# Patient Record
Sex: Female | Born: 1945
Health system: Southern US, Community
[De-identification: ages and names within clinical notes are randomized; demographics above are authoritative.]

## PROBLEM LIST (undated history)

## (undated) DIAGNOSIS — T7840XA Allergy, unspecified, initial encounter: Secondary | ICD-10-CM

## (undated) DIAGNOSIS — C801 Malignant (primary) neoplasm, unspecified: Secondary | ICD-10-CM

## (undated) DIAGNOSIS — R05 Cough: Secondary | ICD-10-CM

## (undated) DIAGNOSIS — K219 Gastro-esophageal reflux disease without esophagitis: Secondary | ICD-10-CM

## (undated) DIAGNOSIS — E785 Hyperlipidemia, unspecified: Secondary | ICD-10-CM

## (undated) DIAGNOSIS — D649 Anemia, unspecified: Secondary | ICD-10-CM

## (undated) DIAGNOSIS — F419 Anxiety disorder, unspecified: Secondary | ICD-10-CM

## (undated) DIAGNOSIS — F32A Depression, unspecified: Secondary | ICD-10-CM

## (undated) DIAGNOSIS — R059 Cough, unspecified: Secondary | ICD-10-CM

## (undated) DIAGNOSIS — F329 Major depressive disorder, single episode, unspecified: Secondary | ICD-10-CM

## (undated) DIAGNOSIS — M1990 Unspecified osteoarthritis, unspecified site: Secondary | ICD-10-CM

## (undated) HISTORY — DX: Depression, unspecified: F32.A

## (undated) HISTORY — DX: Hyperlipidemia, unspecified: E78.5

## (undated) HISTORY — DX: Anxiety disorder, unspecified: F41.9

## (undated) HISTORY — DX: Allergy, unspecified, initial encounter: T78.40XA

## (undated) HISTORY — PX: COLONOSCOPY: SHX174

## (undated) HISTORY — DX: Gastro-esophageal reflux disease without esophagitis: K21.9

## (undated) HISTORY — DX: Major depressive disorder, single episode, unspecified: F32.9

---

## 1978-11-12 HISTORY — PX: BACK SURGERY: SHX140

## 2002-01-09 ENCOUNTER — Other Ambulatory Visit: Admission: RE | Admit: 2002-01-09 | Discharge: 2002-01-09 | Payer: Self-pay | Admitting: Family Medicine

## 2010-11-01 ENCOUNTER — Ambulatory Visit: Payer: Self-pay | Admitting: Family Medicine

## 2010-12-04 ENCOUNTER — Encounter: Payer: Self-pay | Admitting: Family Medicine

## 2011-10-02 LAB — HM MAMMOGRAPHY: HM Mammogram: NORMAL

## 2011-10-02 LAB — HM PAP SMEAR: HM Pap smear: NORMAL

## 2011-11-27 ENCOUNTER — Ambulatory Visit: Payer: Self-pay | Admitting: Family Medicine

## 2012-07-15 ENCOUNTER — Telehealth: Payer: Self-pay

## 2012-07-15 NOTE — Telephone Encounter (Signed)
Dr Smith, please advise.

## 2012-07-15 NOTE — Telephone Encounter (Signed)
Message to Dr. Katrinka Blazing- She wants to speak with Dr. Katrinka Blazing as soon as possible. She has received a letter from [employer] that they have terminated her relationship with [facility]. A previous letter from the practice stated that if your patients did not choose to follow you, and she couldn't because it was too far away, that [place] would be happy to continue her care with another provider.  She thinks the letters are very contradictory and she wants you to get her in with Dr. Sullivan Lone or Dr.Monroe.

## 2012-08-10 ENCOUNTER — Ambulatory Visit (INDEPENDENT_AMBULATORY_CARE_PROVIDER_SITE_OTHER): Payer: Medicare Other | Admitting: Family Medicine

## 2012-08-10 ENCOUNTER — Ambulatory Visit: Payer: Medicare Other

## 2012-08-10 VITALS — BP 128/60 | HR 62 | Temp 97.6°F | Resp 16 | Ht 63.0 in | Wt 128.0 lb

## 2012-08-10 DIAGNOSIS — R059 Cough, unspecified: Secondary | ICD-10-CM

## 2012-08-10 DIAGNOSIS — K219 Gastro-esophageal reflux disease without esophagitis: Secondary | ICD-10-CM

## 2012-08-10 DIAGNOSIS — Z23 Encounter for immunization: Secondary | ICD-10-CM

## 2012-08-10 DIAGNOSIS — J989 Respiratory disorder, unspecified: Secondary | ICD-10-CM

## 2012-08-10 DIAGNOSIS — J309 Allergic rhinitis, unspecified: Secondary | ICD-10-CM

## 2012-08-10 DIAGNOSIS — E7800 Pure hypercholesterolemia, unspecified: Secondary | ICD-10-CM

## 2012-08-10 DIAGNOSIS — R05 Cough: Secondary | ICD-10-CM

## 2012-08-10 DIAGNOSIS — F418 Other specified anxiety disorders: Secondary | ICD-10-CM

## 2012-08-10 DIAGNOSIS — F341 Dysthymic disorder: Secondary | ICD-10-CM

## 2012-08-10 LAB — COMPREHENSIVE METABOLIC PANEL
ALT: 11 U/L (ref 0–35)
AST: 12 U/L (ref 0–37)
Albumin: 4.2 g/dL (ref 3.5–5.2)
Alkaline Phosphatase: 84 U/L (ref 39–117)
BUN: 13 mg/dL (ref 6–23)
CO2: 30 mEq/L (ref 19–32)
Calcium: 9 mg/dL (ref 8.4–10.5)
Chloride: 104 mEq/L (ref 96–112)
Creat: 0.76 mg/dL (ref 0.50–1.10)
Glucose, Bld: 80 mg/dL (ref 70–99)
Potassium: 4.4 mEq/L (ref 3.5–5.3)
Sodium: 141 mEq/L (ref 135–145)
Total Bilirubin: 0.5 mg/dL (ref 0.3–1.2)
Total Protein: 6.6 g/dL (ref 6.0–8.3)

## 2012-08-10 LAB — POCT CBC
Granulocyte percent: 60.9 %G (ref 37–80)
HCT, POC: 42 % (ref 37.7–47.9)
Hemoglobin: 13 g/dL (ref 12.2–16.2)
Lymph, poc: 1.7 (ref 0.6–3.4)
MCH, POC: 27.5 pg (ref 27–31.2)
MCHC: 31 g/dL — AB (ref 31.8–35.4)
MCV: 88.9 fL (ref 80–97)
MID (cbc): 0.4 (ref 0–0.9)
MPV: 9.9 fL (ref 0–99.8)
POC Granulocyte: 3.3 (ref 2–6.9)
POC LYMPH PERCENT: 31.5 %L (ref 10–50)
POC MID %: 7.6 %M (ref 0–12)
Platelet Count, POC: 226 10*3/uL (ref 142–424)
RBC: 4.73 M/uL (ref 4.04–5.48)
RDW, POC: 15.1 %
WBC: 5.5 10*3/uL (ref 4.6–10.2)

## 2012-08-10 LAB — LIPID PANEL
Cholesterol: 208 mg/dL — ABNORMAL HIGH (ref 0–200)
HDL: 65 mg/dL (ref >39)
LDL Cholesterol: 126 mg/dL — ABNORMAL HIGH (ref 0–99)
Total CHOL/HDL Ratio: 3.2 Ratio
Triglycerides: 86 mg/dL (ref <150)
VLDL: 17 mg/dL (ref 0–40)

## 2012-08-10 MED ORDER — FLUOXETINE HCL 20 MG PO CAPS: 20.0000 mg | ORAL_CAPSULE | Freq: Every day | ORAL | Status: DC

## 2012-08-10 MED ORDER — OMEPRAZOLE 40 MG PO CPDR: 40.0000 mg | DELAYED_RELEASE_CAPSULE | Freq: Every day | ORAL | Status: DC

## 2012-08-10 NOTE — Progress Notes (Signed)
[address]   [place], [place]  [address]   [phone]  Subjective:    Patient ID: Nancy Hamilton, female    DOB: Apr 27, 1946, 66 y.o.   MRN: [id number]  HPIThis 66 y.o. female presents to establish care and for follow-up:  1.  Anxiety/depression:  Eleven month follow-up for anxiety.  Tried to re-establish with Dr. Sullivan Lone and office manager refused to continue to provide care.  Dr. Sullivan Lone will only give rx of Prozac for one month.  Requesting medication for three months; [facility].  Husband now only on Ensure three times per day; can only drink through draw.  In January 2013, unable to swallow at all.  Then neck muscles started moving again and able to swallow again.  From then on, able to eat anything.  Now only on Dilantin and Xanax.  Dr. Sandria Manly not sure of reversal.  Without medication, food tasted good for a while.  Unable to talk at this time; mind is still there.  Financially stressful; Hospice and sitter present for one year.  Lots of financial stress.  Pay a little bit per month.  Still working at farm to help pay for sitter.  Sleeping a lot.  Fatigued.  Tired at night.  Sleeping late until 10:30.  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  Compliance with Prozac.    2.  GERD:  Coughing a lot more in past six months; taking Prilosec 40mg  daily; did have one episode of burning in throat.  Denies n/v/d; denies melena or bloody stools; no abdominal pain.  No chronic PND or nasal congestion for past six months.    3.  Allergic rhinitis:  Suffering with sinus congestion for past week.  Now finally improving; compliance with daily Zyrtec.  No fever/chills/sweats. No sinus pressure now.  4.  Hypercholesterolemia:  Ten month follow-up for cholesterol; management at last visit included starting Pravastatin; patient non-compliant with recommendations.  Not eating right; not exercising; eating a lot of junk.  Never started Pravastatin out of fear of side effects.  All siblings with  hypercholesterolemia warranting medication.  5.  Flu vaccine: requesting.    6.  Cough: has worsened over past 6 months; dry cough.  No nighttime cough.  +morning cough.  No sputum.  No SOB or DOE.  No leg swelling.  No chest pain.  No wheezing. No head congestion other than past week.     Review of Systems  Constitutional: Negative for fever, chills, diaphoresis and fatigue.  HENT: Positive for congestion, rhinorrhea, sneezing, voice change and postnasal drip. Negative for ear pain, sore throat, mouth sores, trouble swallowing and ear discharge.   Respiratory: Positive for cough. Negative for shortness of breath and wheezing.   Cardiovascular: Negative for chest pain, palpitations and leg swelling.  Gastrointestinal: Negative for nausea, vomiting, abdominal pain, diarrhea, constipation and blood in stool.  Neurological: Negative for dizziness, weakness, light-headedness, numbness and headaches.  Psychiatric/Behavioral: Positive for dysphoric mood. Negative for suicidal ideas, disturbed wake/sleep cycle and self-injury. The patient is nervous/anxious.         Past Medical History  Diagnosis Date  . Allergy   . Anxiety   . Depression   . GERD (gastroesophageal reflux disease)   . Hyperlipidemia     No past surgical history on file.  Prior to Admission medications   Medication Sig Start Date End Date Taking? Authorizing Provider  cetirizine (ZYRTEC) 10 MG tablet Take 10 mg by mouth daily.   Yes Historical  Provider, MD  FLUoxetine (PROZAC) 20 MG capsule Take 1 capsule (20 mg total) by mouth daily. 08/10/12  Yes Ethelda Chick, MD  omeprazole (PRILOSEC) 40 MG capsule Take 1 capsule (40 mg total) by mouth daily. 08/10/12  Yes Ethelda Chick, MD    Allergies  Allergen Reactions  . Tylox (Oxycodone-Acetaminophen) Rash    History   Social History  . Marital Status: Married    Spouse Name: N/A    Number of Children: N/A  . Years of Education: N/A   Occupational History  . Not  on file.   Social History Main Topics  . Smoking status: Former Smoker    Quit date: 08/10/1992  . Smokeless tobacco: Not on file  . Alcohol Use: No  . Drug Use: No  . Sexually Active: Not Currently   Other Topics Concern  . Not on file   Social History Narrative   Marital status: married; husband with progressively worsening neurological disorder; Hospice involved in 2013.  Living with: husband.  Employment: working 40 hours per week.   Tobacco: former user.   Alcohol: none   Drugs: none   Exercise: none; physically demanding job.    Family History  Problem Relation Age of Onset  . Hyperlipidemia Sister   . Fibromyalgia Sister     Objective:   Physical Exam  Nursing note and vitals reviewed. Constitutional: She is oriented to person, place, and time. She appears well-developed and well-nourished. No distress.  HENT:  Head: Normocephalic and atraumatic.  Right Ear: External ear normal.  Left Ear: External ear normal.  Nose: Nose normal.  Mouth/Throat: Oropharynx is clear and moist.  Eyes: Conjunctivae normal and EOM are normal. Pupils are equal, round, and reactive to light.  Neck: Normal range of motion. Neck supple. No thyromegaly present.  Cardiovascular: Normal rate, regular rhythm, normal heart sounds and intact distal pulses.  Exam reveals no gallop and no friction rub.   No murmur heard. Pulmonary/Chest: Effort normal and breath sounds normal. No respiratory distress. She has no wheezes. She has no rales.  Abdominal: Soft. Bowel sounds are normal. She exhibits no distension and no mass. There is no tenderness. There is no rebound and no guarding.  Lymphadenopathy:    She has no cervical adenopathy.  Neurological: She is alert and oriented to person, place, and time.  Skin: Skin is warm and dry. She is not diaphoretic.  Psychiatric: She has a normal mood and affect. Her behavior is normal. Judgment and thought content normal.       Anxious.    INFLUENZA VACCINE  ADMINISTERED.      Results for orders placed in visit on 08/10/12  POCT CBC      Component Value Range   WBC 5.5  4.6 - 10.2 K/uL   Lymph, poc 1.7  0.6 - 3.4   POC LYMPH PERCENT 31.5  10 - 50 %L   MID (cbc) 0.4  0 - 0.9   POC MID % 7.6  0 - 12 %M   POC Granulocyte 3.3  2 - 6.9   Granulocyte percent 60.9  37 - 80 %G   RBC 4.73  4.04 - 5.48 M/uL   Hemoglobin 13.0  12.2 - 16.2 g/dL   HCT, POC 96.0  45.4 - 47.9 %   MCV 88.9  80 - 97 fL   MCH, POC 27.5  27 - 31.2 pg   MCHC 31.0 (*) 31.8 - 35.4 g/dL   RDW, POC 15.1  Platelet Count, POC 226  142 - 424 K/uL   MPV 9.9  0 - 99.8 fL    [facility] reading (PRIMARY) by  Dr. Katrinka Blazing.  CXR; NAD   Assessment & Plan:   1. Depression with anxiety    2. Allergic rhinitis, cause unspecified    3. Pure hypercholesterolemia  POCT CBC, Comprehensive metabolic panel, Lipid panel  4. GERD (gastroesophageal reflux disease)    5. Need for prophylactic vaccination and inoculation against influenza  Flu vaccine greater than or equal to 3yo preservative free IM  6. Cough  DG Chest 2 View      1.  Depression with anxiety:  Stable despite declining health of husband; good social/family support.  Hospice involved. Counseling provided during visit. Rx for Prozac provided. 2.  Allergic Rhinitis: stable; continue current medication. 3.  Hypercholesterolemia: Mild but strong family history of early CAD; obtain labs; patient non-compliant with Pravastatin therapy; to start. 4.  GERD: possibly worsening due to recurrent cough; continue Prilosec but increase to bid for next month and then decrease to one daily.  Dietary modification reviewed.  RF provided. 5.  Cough:  New/recurrent; CXR negative; historically chronic cough due to GERD; has undergone extensive evaluation in past.  Increase PPI to bid for one month.  If persists, RTC.  6.  S/p flu vaccine.

## 2012-08-10 NOTE — Patient Instructions (Addendum)
1. Depression with anxiety    2. Allergic rhinitis, cause unspecified    3. Pure hypercholesterolemia  POCT CBC, Comprehensive metabolic panel, Lipid panel  4. GERD (gastroesophageal reflux disease)    5. Need for prophylactic vaccination and inoculation against influenza  Flu vaccine greater than or equal to 65yo preservative free IM  6. Cough  DG Chest 2 View

## 2012-08-12 NOTE — Telephone Encounter (Signed)
Patient presented to [facility] for follow-up and rx refills 08/10/12.  No further action warranted at this time.

## 2012-08-13 ENCOUNTER — Encounter: Payer: Self-pay | Admitting: Family Medicine

## 2012-08-13 NOTE — Progress Notes (Signed)
Reviewed and agree.

## 2012-08-14 ENCOUNTER — Telehealth: Payer: Self-pay

## 2012-08-14 NOTE — Telephone Encounter (Signed)
Pt left a message on the billing office voicemail, that she is returning our call for lab results Please call pt on her work cell number [phone]

## 2012-08-14 NOTE — Telephone Encounter (Signed)
Pt has a lab and xray overread

## 2012-08-16 NOTE — Telephone Encounter (Signed)
See labs 

## 2012-08-26 ENCOUNTER — Encounter: Payer: Self-pay | Admitting: Radiology

## 2012-09-02 ENCOUNTER — Telehealth: Payer: Self-pay

## 2012-09-02 ENCOUNTER — Telehealth: Payer: Self-pay | Admitting: Radiology

## 2012-09-02 NOTE — Telephone Encounter (Signed)
Spoke with pt and gave her advice from Dr Katrinka Blazing in regards to taking cholesterol medication. Pt did state understanding about the med and x-ray report.   FYI: She states she has been faithful in taking the Prilosec(even doubled it one day) to see if any difference, nothing.  Also she has been taking her Zantac regularly.  But she is still dealing with the cough, would like to know what else could this be causing her cough?

## 2012-09-04 ENCOUNTER — Telehealth: Payer: Self-pay | Admitting: Family Medicine

## 2012-09-04 NOTE — Telephone Encounter (Signed)
Spoke with patient gave her details and patient states that she think that its her allergies

## 2012-09-04 NOTE — Telephone Encounter (Signed)
Call --- chronic cough is usually one of a few things:  1 . Allergies (is she having nasal congestion, runny nose, sneezing, or drainage in back of throat?).  2.  GERD/reflux   3.  Asthma (is she short of breath with exertion?  Is she having any chest tightness with exertion or exposure to cold air?).  KMS

## 2012-10-01 LAB — HM COLONOSCOPY: HM Colonoscopy: 2

## 2012-10-17 NOTE — Telephone Encounter (Signed)
none

## 2012-12-01 ENCOUNTER — Encounter: Payer: Self-pay | Admitting: Internal Medicine

## 2012-12-01 ENCOUNTER — Ambulatory Visit (INDEPENDENT_AMBULATORY_CARE_PROVIDER_SITE_OTHER): Payer: Medicare Other | Admitting: Internal Medicine

## 2012-12-01 ENCOUNTER — Ambulatory Visit: Payer: Self-pay | Admitting: Family Medicine

## 2012-12-01 VITALS — BP 120/68 | HR 67 | Temp 97.5°F | Resp 16 | Ht 62.0 in | Wt 133.5 lb

## 2012-12-01 DIAGNOSIS — E559 Vitamin D deficiency, unspecified: Secondary | ICD-10-CM

## 2012-12-01 DIAGNOSIS — R053 Chronic cough: Secondary | ICD-10-CM | POA: Insufficient documentation

## 2012-12-01 DIAGNOSIS — R05 Cough: Secondary | ICD-10-CM | POA: Insufficient documentation

## 2012-12-01 DIAGNOSIS — J4530 Mild persistent asthma, uncomplicated: Secondary | ICD-10-CM | POA: Insufficient documentation

## 2012-12-01 DIAGNOSIS — F411 Generalized anxiety disorder: Secondary | ICD-10-CM

## 2012-12-01 DIAGNOSIS — R059 Cough, unspecified: Secondary | ICD-10-CM

## 2012-12-01 DIAGNOSIS — E785 Hyperlipidemia, unspecified: Secondary | ICD-10-CM

## 2012-12-01 DIAGNOSIS — K219 Gastro-esophageal reflux disease without esophagitis: Secondary | ICD-10-CM

## 2012-12-01 NOTE — Progress Notes (Signed)
Patient ID: Nancy Hamilton, female   DOB: [birth year], 67 y.o.   MRN: [id number]   Patient Active Problem List  Diagnosis  . Generalized anxiety disorder  . GERD (gastroesophageal reflux disease)  . Cough    Subjective:  CC:   Chief Complaint  Patient presents with  . Establish Care    HPI:   Nancy Hamilton is a 67 y.o. female who presents as a new patient to establish primary care .  CC is too many medications,  She was treated for URI/chronic cough and was put on 4 meds.  Including patanase, xuzal,  singulair and pantoprazole.   History of GAD second ary to husbands' decline from [facility] Syndrome ,  Now receiving help from Hospice.  Hyperlipidemia now on statins since November (when cxr showed coronary calc's) without adverse effects but having hot flahses since she started. Has not had choelsterol recheced.  So she is fasting today .   History of reflux previously managed with omeprazole, now on protonix.   Allergy testing was negative but she sneezes, so she thinks she needs to be on allergy medications.  CXR was done for cough and coronary calicifcations was noted.   Sees [facility] annually Feb 17th   Last DEXA was recently not sure when due at [facility]   Mammograms now done at [place]. Wants to switch to [facility]      Past Medical History  Diagnosis Date  . Allergy   . Anxiety   . Depression   . GERD (gastroesophageal reflux disease)   . Hyperlipidemia     Past Surgical History  Procedure Date  . Back surgery 1980    Ruptured Disc    Family History  Problem Relation Age of Onset  . Hyperlipidemia Sister   . Fibromyalgia Sister     History   Social History  . Marital Status: Married    Spouse Name: N/A    Number of Children: N/A  . Years of Education: N/A   Occupational History  . Not on file.   Social History Main Topics  . Smoking status: Former Smoker    Quit date: 08/10/1992  . Smokeless tobacco: Not on file  . Alcohol Use: No  .  Drug Use: No  . Sexually Active: Not Currently   Other Topics Concern  . Not on file   Social History Narrative   Marital status: married; husband with progressively worsening neurological disorder; Hospice involved in 2013.  Living with: husband.  Employment: working 40 hours per week.   Tobacco: former user.   Alcohol: none   Drugs: none   Exercise: none; physically demanding job.   Allergies  Allergen Reactions  . Tylox (Oxycodone-Acetaminophen) Rash    Review of Systems:   Patient denies headache, fevers, malaise, unintentional weight loss, skin rash, eye pain, sinus congestion and sinus pain, sore throat, dysphagia,  hemoptysis , cough, dyspnea, wheezing, chest pain, palpitations, orthopnea, edema, abdominal pain, nausea, melena, diarrhea, constipation, flank pain, dysuria, hematuria, urinary  Frequency, nocturia, numbness, tingling, seizures,  Focal weakness, Loss of consciousness,  Tremor, insomnia, depression, anxiety, and suicidal ideation.       Objective:  BP 120/68  Pulse 67  Temp 97.5 F (36.4 C) (Oral)  Resp 16  Ht 5\' 2"  (1.575 m)  Wt 133 lb 8 oz (60.555 kg)  BMI 24.42 kg/m2  SpO2 98%  General appearance: alert, cooperative and appears stated age Ears: normal TM's and external ear canals both ears Throat: lips, mucosa, and  tongue normal; teeth and gums normal Neck: no adenopathy, no carotid bruit, supple, symmetrical, trachea midline and thyroid not enlarged, symmetric, no tenderness/mass/nodules Back: symmetric, no curvature. ROM normal. No CVA tenderness. Lungs: clear to auscultation bilaterally Heart: regular rate and rhythm, S1, S2 normal, no murmur, click, rub or gallop Abdomen: soft, non-tender; bowel sounds normal; no masses,  no organomegaly Pulses: 2+ and symmetric Skin: Skin color, texture, turgor normal. No rashes or lesions Lymph nodes: Cervical, supraclavicular, and axillary nodes normal.  Assessment and Plan:  Cough Chronic, etiology  unclear, now resolved on 4 drug therapy. .  Will reduce meds one by one .,. Stop patanase first.   GERD (gastroesophageal reflux disease) Managed with protonix.   Generalized anxiety disorder Well controlled on current regimen.  no changes today.  Other and unspecified hyperlipidemia Well controlled on current regimen. LFTS are normal, no changes today.   Updated Medication List Outpatient Encounter Prescriptions as of 12/01/2012  Medication Sig Dispense Refill  . aspirin EC 81 MG tablet Take 81 mg by mouth daily.      . Coenzyme Q10 (CO Q 10 PO) Take 200 mg by mouth daily.      Marland Kitchen FLUoxetine (PROZAC) 20 MG capsule Take 1 capsule (20 mg total) by mouth daily.  90 capsule  1  . levocetirizine (XYZAL) 5 MG tablet Take 5 mg by mouth every evening.       . montelukast (SINGULAIR) 10 MG tablet Take 10 mg by mouth at bedtime.       . Olopatadine HCl (PATANASE) 0.6 % SOLN Instill 2 sprays into each nostril twice a day.      . pantoprazole (PROTONIX) 20 MG tablet Take 20 mg by mouth daily. 30 minutes before eating.      . pravastatin (PRAVACHOL) 20 MG tablet Take 20 mg by mouth every evening.       . [DISCONTINUED] cetirizine (ZYRTEC) 10 MG tablet Take 10 mg by mouth daily.      . [DISCONTINUED] omeprazole (PRILOSEC) 40 MG capsule Take 1 capsule (40 mg total) by mouth daily.  90 capsule  1

## 2012-12-01 NOTE — Patient Instructions (Signed)
Stop the patanase spray  Instead use Simply saline twice daily as a flush .  Get it at [facility].  Continue the montelukast and levocetiriZine for now.   In one month, reassess symptoms.,  If cough still gone.,  Stop the montelukast   Come back in 2 months for your physical  I will check your old records   Checking cholesterol, liver and kidney function today

## 2012-12-01 NOTE — Assessment & Plan Note (Signed)
Well controlled on current regimen. LFTS are normal, no changes today.

## 2012-12-01 NOTE — Assessment & Plan Note (Signed)
Well controlled on current regimen.  no changes today.   

## 2012-12-01 NOTE — Assessment & Plan Note (Signed)
Managed with protonix.

## 2012-12-01 NOTE — Assessment & Plan Note (Signed)
Chronic, etiology unclear, now resolved on 4 drug therapy. .  Will reduce meds one by one .,. Stop patanase first.

## 2012-12-02 LAB — VITAMIN D 25 HYDROXY (VIT D DEFICIENCY, FRACTURES): Vit D, 25-Hydroxy: 29 ng/mL — ABNORMAL LOW (ref 30–89)

## 2012-12-17 ENCOUNTER — Telehealth: Payer: Self-pay | Admitting: *Deleted

## 2012-12-17 NOTE — Telephone Encounter (Signed)
Pt notified of normal mammogram results

## 2013-01-28 ENCOUNTER — Encounter: Payer: Self-pay | Admitting: Family Medicine

## 2013-02-18 ENCOUNTER — Ambulatory Visit (INDEPENDENT_AMBULATORY_CARE_PROVIDER_SITE_OTHER): Payer: Medicare Other | Admitting: Internal Medicine

## 2013-02-18 ENCOUNTER — Encounter: Payer: Self-pay | Admitting: Internal Medicine

## 2013-02-18 VITALS — BP 118/70 | HR 67 | Temp 98.4°F | Resp 16 | Ht 62.0 in | Wt 135.5 lb

## 2013-02-18 DIAGNOSIS — Z13820 Encounter for screening for osteoporosis: Secondary | ICD-10-CM

## 2013-02-18 DIAGNOSIS — Z23 Encounter for immunization: Secondary | ICD-10-CM

## 2013-02-18 DIAGNOSIS — R059 Cough, unspecified: Secondary | ICD-10-CM

## 2013-02-18 DIAGNOSIS — Z79899 Other long term (current) drug therapy: Secondary | ICD-10-CM

## 2013-02-18 DIAGNOSIS — E785 Hyperlipidemia, unspecified: Secondary | ICD-10-CM

## 2013-02-18 DIAGNOSIS — M810 Age-related osteoporosis without current pathological fracture: Secondary | ICD-10-CM

## 2013-02-18 DIAGNOSIS — Z0000 Encounter for general adult medical examination without abnormal findings: Secondary | ICD-10-CM

## 2013-02-18 DIAGNOSIS — R5381 Other malaise: Secondary | ICD-10-CM

## 2013-02-18 DIAGNOSIS — R05 Cough: Secondary | ICD-10-CM

## 2013-02-18 DIAGNOSIS — M8580 Other specified disorders of bone density and structure, unspecified site: Secondary | ICD-10-CM

## 2013-02-18 DIAGNOSIS — E2839 Other primary ovarian failure: Secondary | ICD-10-CM

## 2013-02-18 LAB — LIPID PANEL
Cholesterol: 194 mg/dL (ref 0–200)
HDL: 70.3 mg/dL (ref >39.00)
LDL Cholesterol: 106 mg/dL — ABNORMAL HIGH (ref 0–99)
Total CHOL/HDL Ratio: 3
Triglycerides: 91 mg/dL (ref 0.0–149.0)
VLDL: 18.2 mg/dL (ref 0.0–40.0)

## 2013-02-18 LAB — COMPREHENSIVE METABOLIC PANEL
ALT: 15 U/L (ref 0–35)
AST: 18 U/L (ref 0–37)
Albumin: 3.9 g/dL (ref 3.5–5.2)
Alkaline Phosphatase: 81 U/L (ref 39–117)
BUN: 12 mg/dL (ref 6–23)
CO2: 31 mEq/L (ref 19–32)
Calcium: 9 mg/dL (ref 8.4–10.5)
Chloride: 102 mEq/L (ref 96–112)
Creatinine, Ser: 0.9 mg/dL (ref 0.4–1.2)
GFR: 71.16 mL/min (ref >60.00)
Glucose, Bld: 93 mg/dL (ref 70–99)
Potassium: 4.2 mEq/L (ref 3.5–5.1)
Sodium: 139 mEq/L (ref 135–145)
Total Bilirubin: 0.5 mg/dL (ref 0.3–1.2)
Total Protein: 6.9 g/dL (ref 6.0–8.3)

## 2013-02-18 LAB — CBC WITH DIFFERENTIAL/PLATELET
Basophils Absolute: 0 10*3/uL (ref 0.0–0.1)
Basophils Relative: 0.3 % (ref 0.0–3.0)
Eosinophils Absolute: 0.2 10*3/uL (ref 0.0–0.7)
Eosinophils Relative: 3.2 % (ref 0.0–5.0)
HCT: 37.6 % (ref 36.0–46.0)
Hemoglobin: 12.2 g/dL (ref 12.0–15.0)
Lymphocytes Relative: 26.8 % (ref 12.0–46.0)
Lymphs Abs: 1.3 10*3/uL (ref 0.7–4.0)
MCHC: 32.5 g/dL (ref 30.0–36.0)
MCV: 84.6 fl (ref 78.0–100.0)
Monocytes Absolute: 0.4 10*3/uL (ref 0.1–1.0)
Monocytes Relative: 8.9 % (ref 3.0–12.0)
Neutro Abs: 2.9 10*3/uL (ref 1.4–7.7)
Neutrophils Relative %: 60.8 % (ref 43.0–77.0)
Platelets: 216 10*3/uL (ref 150.0–400.0)
RBC: 4.45 Mil/uL (ref 3.87–5.11)
RDW: 15 % — ABNORMAL HIGH (ref 11.5–14.6)
WBC: 4.8 10*3/uL (ref 4.5–10.5)

## 2013-02-18 LAB — TSH: TSH: 2.09 u[IU]/mL (ref 0.35–5.50)

## 2013-02-18 MED ORDER — PANTOPRAZOLE SODIUM 40 MG PO TBEC: 40.0000 mg | DELAYED_RELEASE_TABLET | Freq: Every day | ORAL | Status: DC

## 2013-02-18 MED ORDER — FLUOXETINE HCL 20 MG PO CAPS: 20.0000 mg | ORAL_CAPSULE | Freq: Every day | ORAL | Status: DC

## 2013-02-18 NOTE — Progress Notes (Signed)
Patient ID: Nancy Hamilton, female   DOB: 08-04-1946, 67 y.o.   MRN: [id number]    The patient is here for annual Medicare wellness examination and management of other chronic and acute problems.   The risk factors are reflected in the social history.  The roster of all physicians providing medical care to patient - is listed in the Snapshot section of the chart.  Activities of daily living:  The patient is 100% independent in all ADLs: dressing, toileting, feeding as well as independent mobility  Home safety : The patient has smoke detectors in the home. They wear seatbelts.  There are no firearms at home. There is no violence in the home.   There is no risks for hepatitis, STDs or HIV. There is no   history of blood transfusion. They have no travel history to infectious disease endemic areas of the world.  The patient has seen their dentist in the last six month. They have seen their eye doctor in the last year. They admit to slight hearing difficulty with regard to whispered voices and some television programs.  They have deferred audiologic testing in the last year.  They do not  have excessive sun exposure. Discussed the need for sun protection: hats, long sleeves and use of sunscreen if there is significant sun exposure.   Diet: the importance of a healthy diet is discussed. They do have a healthy diet.  The benefits of regular aerobic exercise were discussed. She walks 4 times per week ,  20 minutes.   Depression screen: there are no signs or vegative symptoms of depression- irritability, change in appetite, anhedonia, sadness/tearfullness.  Cognitive assessment: the patient manages all their financial and personal affairs and is actively engaged. They could relate day,date,year and events; recalled 2/3 objects at 3 minutes; performed clock-face test normally.  The following portions of the patient's history were reviewed and updated as appropriate: allergies, current medications, past  family history, past medical history,  past surgical history, past social history  and problem list.  Visual acuity was not assessed per patient preference since she has regular follow up with her ophthalmologist. Hearing and body mass index were assessed and reviewed.   During the course of the visit the patient was educated and counseled about appropriate screening and preventive services including : fall prevention , diabetes screening, nutrition counseling, colorectal cancer screening, and recommended immunizations.    Objective  BP 118/70  Pulse 67  Temp(Src) 98.4 F (36.9 C) (Oral)  Resp 16  Ht 5\' 2"  (1.575 m)  Wt 135 lb 8 oz (61.462 kg)  BMI 24.78 kg/m2  SpO2 98%  General Appearance:    Alert, cooperative, no distress, appears stated age  Head:    Normocephalic, without obvious abnormality, atraumatic  Eyes:    PERRL, conjunctiva/corneas clear, EOM's intact, fundi    benign, both eyes  Ears:    Normal TM's and external ear canals, both ears  Nose:   Nares normal, septum midline, mucosa normal, no drainage    or sinus tenderness  Throat:   Lips, mucosa, and tongue normal; teeth and gums normal  Neck:   Supple, symmetrical, trachea midline, no adenopathy;    thyroid:  no enlargement/tenderness/nodules; no carotid   bruit or JVD  Back:     Symmetric, no curvature, ROM normal, no CVA tenderness  Lungs:     Clear to auscultation bilaterally, respirations unlabored  Chest Wall:    No tenderness or deformity   Heart:  Regular rate and rhythm, S1 and S2 normal, no murmur, rub   or gallop  Breast Exam:    No tenderness, masses, or nipple abnormality  Abdomen:     Soft, non-tender, bowel sounds active all four quadrants,    no masses, no organomegaly  Extremities:   Extremities normal, atraumatic, no cyanosis or edema  Pulses:   2+ and symmetric all extremities  Skin:   Skin color, texture, turgor normal, no rashes or lesions  Lymph nodes:   Cervical, supraclavicular, and  axillary nodes normal  Neurologic:   CNII-XII intact, normal strength, sensation and reflexes    throughout    Assessment and plan:  Routine general medical examination at a health care facility Annual comprehensive exam was done including breast and pelvic without PAP  Smear. All screenings have been addressed . Her Pap smear was normal in November 2012 and she is not sexually active currently. We will repeat in 2015.  Cough Likely due to reflux secondary to eating late at night. Continue Protonix. Changing habits. We'll consider adding a second PPI versus ENT exam is nocturnal cough continues. PFTs were normal in 2010 per patient.  Other and unspecified hyperlipidemia Well-controlled on low-dose pravastatin. LFTs are normal. No changes today.   Updated Medication List Outpatient Encounter Prescriptions as of 02/18/2013  Medication Sig Dispense Refill  . aspirin EC 81 MG tablet Take 81 mg by mouth daily.      . cetirizine (ZYRTEC) 10 MG tablet Take 10 mg by mouth daily.      . Coenzyme Q10 (CO Q 10 PO) Take 200 mg by mouth daily.      Marland Kitchen FLUoxetine (PROZAC) 20 MG capsule Take 1 capsule (20 mg total) by mouth daily.  90 capsule  1  . levocetirizine (XYZAL) 5 MG tablet Take 5 mg by mouth every evening.       . pantoprazole (PROTONIX) 40 MG tablet Take 1 tablet (40 mg total) by mouth daily. 30 minutes before eating.  30 tablet  5  . pravastatin (PRAVACHOL) 20 MG tablet Take 20 mg by mouth every evening.       . sodium chloride (OCEAN) 0.65 % nasal spray Place 2 sprays into the nose as needed for congestion.      . [DISCONTINUED] FLUoxetine (PROZAC) 20 MG capsule Take 1 capsule (20 mg total) by mouth daily.  90 capsule  1  . [DISCONTINUED] pantoprazole (PROTONIX) 20 MG tablet Take 20 mg by mouth daily. 30 minutes before eating.      . montelukast (SINGULAIR) 10 MG tablet Take 10 mg by mouth at bedtime.       . Olopatadine HCl (PATANASE) 0.6 % SOLN Instill 2 sprays into each nostril twice a  day.       No facility-administered encounter medications on file as of 02/18/2013.

## 2013-02-18 NOTE — Patient Instructions (Addendum)
You received the pneumonia vaccine today.  We will repeat it in 5 years.  You had a pelvic exam but no PAP because you had one Nov 2012 and it was normal.  We will repeat the PAP smear next year with your pelvic exam.  We should repeat your bone density test this year (ordered)  -----------------------------------------------------------------------------------------------------------------------  Stop the zyrtec ,  You do not need it. You are taking xyzal  Stop eating before bedtime.  It is aggravating your cough. (must be done at least 2 hours before bedtime)    I am increasing your protonix to 40 mg daily , take it 30 minutes before food in the morning.   Stop eating chocolate for a month .   If no improvement in one month,  Call / e amil to set up PFTS and appt with Dr Kendrick Fries

## 2013-02-19 ENCOUNTER — Encounter: Payer: Self-pay | Admitting: General Practice

## 2013-02-19 ENCOUNTER — Other Ambulatory Visit: Payer: Self-pay | Admitting: Family Medicine

## 2013-02-20 DIAGNOSIS — Z0000 Encounter for general adult medical examination without abnormal findings: Secondary | ICD-10-CM | POA: Insufficient documentation

## 2013-02-20 NOTE — Assessment & Plan Note (Signed)
Likely due to reflux secondary to eating late at night. Continue Protonix. Changing habits. We'll consider adding a second PPI versus ENT exam is nocturnal cough continues. PFTs were normal in 2010 per patient.

## 2013-02-20 NOTE — Assessment & Plan Note (Signed)
Annual comprehensive exam was done including breast and pelvic without PAP  Smear. All screenings have been addressed . Her Pap smear was normal in November 2012 and she is not sexually active currently. We will repeat in 2015.

## 2013-02-20 NOTE — Assessment & Plan Note (Signed)
Well-controlled on low-dose pravastatin. LFTs are normal. No changes today.

## 2013-03-03 ENCOUNTER — Ambulatory Visit: Payer: Self-pay | Admitting: Internal Medicine

## 2013-03-03 LAB — HM DEXA SCAN

## 2013-03-05 ENCOUNTER — Other Ambulatory Visit: Payer: Self-pay | Admitting: Internal Medicine

## 2013-03-06 ENCOUNTER — Other Ambulatory Visit: Payer: Self-pay | Admitting: *Deleted

## 2013-03-06 MED ORDER — PRAVASTATIN SODIUM 20 MG PO TABS: 20.0000 mg | ORAL_TABLET | Freq: Every evening | ORAL | Status: DC

## 2013-03-06 NOTE — Telephone Encounter (Signed)
Rx sent to pharmacy by escript  

## 2013-03-08 ENCOUNTER — Telehealth: Payer: Self-pay | Admitting: Internal Medicine

## 2013-03-08 NOTE — Telephone Encounter (Signed)
Bone Density scores received, she has osteopenia,  Moderate.  Would repeat in 2 years and consider therapy then if there is a significant change. Continue calcium, vitamin d and weight bearing exercise on a regular basis.  

## 2013-03-09 NOTE — Telephone Encounter (Signed)
Patient notified as instructed. 

## 2013-03-17 ENCOUNTER — Telehealth: Payer: Self-pay | Admitting: Internal Medicine

## 2013-03-17 DIAGNOSIS — R053 Chronic cough: Secondary | ICD-10-CM

## 2013-03-17 DIAGNOSIS — R05 Cough: Secondary | ICD-10-CM

## 2013-03-17 NOTE — Telephone Encounter (Signed)
Pt states at previous appt she was coughing a lot and was given some instructions to follow to see if it was GERD.  Pt states she has not eaten hardly any chocolate and most days has not eaten within 2 hours of sleep.  Pt states she and Dr. Darrick Huntsman discussed a referral if this did not clear up with these measures.  Pt states she would like the referral as she still has persistent coughing, feels as though when she takes a breath in it causes her to cough.  Pt would like call to her cell.

## 2013-03-18 NOTE — Telephone Encounter (Signed)
Referral is in process as requested to Duffy Bruce, ENT

## 2013-03-19 ENCOUNTER — Encounter: Payer: Self-pay | Admitting: Internal Medicine

## 2013-03-19 NOTE — Telephone Encounter (Signed)
Patient notified

## 2013-03-24 ENCOUNTER — Encounter: Payer: Self-pay | Admitting: Internal Medicine

## 2013-04-09 ENCOUNTER — Telehealth: Payer: Self-pay | Admitting: *Deleted

## 2013-04-09 MED ORDER — LEVOCETIRIZINE DIHYDROCHLORIDE 5 MG PO TABS: 5.0000 mg | ORAL_TABLET | Freq: Every evening | ORAL | Status: DC

## 2013-04-09 NOTE — Telephone Encounter (Signed)
Patient called needing refill-sent to pharmacy.  

## 2013-04-10 ENCOUNTER — Telehealth: Payer: Self-pay | Admitting: *Deleted

## 2013-04-10 NOTE — Telephone Encounter (Signed)
If the protonix is not working,  We should try treating post nasal drip.  Ask her to flush her sinuses twice daily Simply saline (sterile saline solution) and teak 25 mg of generic benadryl in the evening only.  Has she seen the ENT doc yet?

## 2013-04-10 NOTE — Telephone Encounter (Signed)
States that you had prescribed protonix for cough and she is still coughing is there something else she can try? Please advise

## 2013-04-13 NOTE — Telephone Encounter (Signed)
Patient notified

## 2013-04-13 NOTE — Telephone Encounter (Signed)
contineu protonix in the am 30 minutes prior to eating,  And start benadryl and famotidine 20 mg at bedtime for nighttime cough

## 2013-04-13 NOTE — Telephone Encounter (Signed)
Patient saw ENT , which stated must be acid reflux to patient no other cause reported from ENT. Patient notified as requested.

## 2013-08-18 ENCOUNTER — Other Ambulatory Visit: Payer: Self-pay | Admitting: Internal Medicine

## 2013-08-20 ENCOUNTER — Encounter (INDEPENDENT_AMBULATORY_CARE_PROVIDER_SITE_OTHER): Payer: Self-pay

## 2013-08-20 ENCOUNTER — Ambulatory Visit (INDEPENDENT_AMBULATORY_CARE_PROVIDER_SITE_OTHER): Payer: Medicare Other

## 2013-08-20 DIAGNOSIS — Z23 Encounter for immunization: Secondary | ICD-10-CM

## 2013-08-28 ENCOUNTER — Telehealth: Payer: Self-pay | Admitting: Internal Medicine

## 2013-08-28 DIAGNOSIS — K209 Esophagitis, unspecified without bleeding: Secondary | ICD-10-CM

## 2013-08-28 DIAGNOSIS — R05 Cough: Secondary | ICD-10-CM

## 2013-08-28 DIAGNOSIS — R059 Cough, unspecified: Secondary | ICD-10-CM

## 2013-08-28 NOTE — Telephone Encounter (Signed)
The patient called stating her pantoprazole (PROTONIX) 40 MG tablet is not working . She stated some other things going on and I asked did she want to speak with the triage nurse and she stated that would be a good idea. Forwarded to triage.

## 2013-08-28 NOTE — Telephone Encounter (Signed)
Called to notify pt that she may schedule her own mammogram appointment, but pt says Dr. Darrick Huntsman told her she needed to go to a [facility] facility this year for her dense breasts, did not want her going in [place].

## 2013-08-28 NOTE — Telephone Encounter (Signed)
Caller: Nancy Hamilton/Patient; Phone: [phone]; Reason for Call: Patient is calling regarding her cough.  She states that it has subsided some what, however, she still has this cough.  She states that she would like to go to a GI MD for endoscopy to make sure there is nothing else going on.  She states sometimes she has a lot of burning in her throat.  She wants to get this done before her insurance changes the first of the year.  She also wants to have her Mammogram scheduled.

## 2013-08-28 NOTE — Telephone Encounter (Signed)
Referral is in process as requested 

## 2013-08-29 ENCOUNTER — Other Ambulatory Visit: Payer: Self-pay | Admitting: Internal Medicine

## 2013-08-31 NOTE — Telephone Encounter (Signed)
Pt has spoken to [facility] regarding referral.

## 2013-09-05 ENCOUNTER — Other Ambulatory Visit: Payer: Self-pay | Admitting: Internal Medicine

## 2013-11-17 ENCOUNTER — Other Ambulatory Visit: Payer: Self-pay | Admitting: Internal Medicine

## 2013-11-20 ENCOUNTER — Telehealth: Payer: Self-pay | Admitting: Internal Medicine

## 2013-11-20 NOTE — Telephone Encounter (Signed)
Pt call she received a letter from [facility] stating its time for her mammogram.  She stated that last year dr Derrel Nip told her she wanted to send her some where else for her mammogram.   Please let pt know when and where the appointment is at

## 2013-11-20 NOTE — Telephone Encounter (Signed)
Patient notified

## 2013-11-20 NOTE — Telephone Encounter (Signed)
Seh can go back to [facility].  They have addressed all of their issues and they now doing an excellent job

## 2013-11-20 NOTE — Telephone Encounter (Signed)
Please advise does patient need referral to another imaging center or is [facility] ok?

## 2013-11-27 LAB — HM MAMMOGRAPHY

## 2013-12-02 ENCOUNTER — Ambulatory Visit: Payer: Self-pay | Admitting: Internal Medicine

## 2013-12-23 ENCOUNTER — Encounter: Payer: Self-pay | Admitting: Internal Medicine

## 2014-02-08 ENCOUNTER — Other Ambulatory Visit: Payer: Self-pay | Admitting: Internal Medicine

## 2014-02-09 NOTE — Telephone Encounter (Signed)
Appt 02/25/14.

## 2014-02-23 ENCOUNTER — Encounter: Payer: Medicare Other | Admitting: Internal Medicine

## 2014-02-25 ENCOUNTER — Encounter: Payer: Self-pay | Admitting: Internal Medicine

## 2014-02-25 ENCOUNTER — Ambulatory Visit (INDEPENDENT_AMBULATORY_CARE_PROVIDER_SITE_OTHER): Payer: Commercial Managed Care - HMO | Admitting: Internal Medicine

## 2014-02-25 ENCOUNTER — Other Ambulatory Visit (HOSPITAL_COMMUNITY)
Admission: RE | Admit: 2014-02-25 | Discharge: 2014-02-25 | Disposition: A | Discharge status: Home / Self Care | Payer: Medicare HMO | Source: Ambulatory Visit | Attending: Internal Medicine | Admitting: Internal Medicine

## 2014-02-25 VITALS — BP 118/60 | HR 70 | Temp 98.3°F | Resp 16 | Ht 62.5 in | Wt 137.0 lb

## 2014-02-25 DIAGNOSIS — R05 Cough: Secondary | ICD-10-CM

## 2014-02-25 DIAGNOSIS — Z124 Encounter for screening for malignant neoplasm of cervix: Secondary | ICD-10-CM | POA: Insufficient documentation

## 2014-02-25 DIAGNOSIS — K635 Polyp of colon: Secondary | ICD-10-CM

## 2014-02-25 DIAGNOSIS — Z79899 Other long term (current) drug therapy: Secondary | ICD-10-CM

## 2014-02-25 DIAGNOSIS — Z23 Encounter for immunization: Secondary | ICD-10-CM

## 2014-02-25 DIAGNOSIS — Z0000 Encounter for general adult medical examination without abnormal findings: Secondary | ICD-10-CM

## 2014-02-25 DIAGNOSIS — D649 Anemia, unspecified: Secondary | ICD-10-CM

## 2014-02-25 DIAGNOSIS — R5381 Other malaise: Secondary | ICD-10-CM

## 2014-02-25 DIAGNOSIS — Z85828 Personal history of other malignant neoplasm of skin: Secondary | ICD-10-CM

## 2014-02-25 DIAGNOSIS — K219 Gastro-esophageal reflux disease without esophagitis: Secondary | ICD-10-CM

## 2014-02-25 DIAGNOSIS — D126 Benign neoplasm of colon, unspecified: Secondary | ICD-10-CM

## 2014-02-25 DIAGNOSIS — R059 Cough, unspecified: Secondary | ICD-10-CM

## 2014-02-25 DIAGNOSIS — Z1151 Encounter for screening for human papillomavirus (HPV): Secondary | ICD-10-CM | POA: Insufficient documentation

## 2014-02-25 DIAGNOSIS — R5383 Other fatigue: Secondary | ICD-10-CM

## 2014-02-25 DIAGNOSIS — E785 Hyperlipidemia, unspecified: Secondary | ICD-10-CM

## 2014-02-25 DIAGNOSIS — E559 Vitamin D deficiency, unspecified: Secondary | ICD-10-CM

## 2014-02-25 LAB — LIPID PANEL
Cholesterol: 174 mg/dL (ref 0–200)
HDL: 69.7 mg/dL (ref >39.00)
LDL Cholesterol: 90 mg/dL (ref 0–99)
Total CHOL/HDL Ratio: 2
Triglycerides: 74 mg/dL (ref 0.0–149.0)
VLDL: 14.8 mg/dL (ref 0.0–40.0)

## 2014-02-25 LAB — CBC WITH DIFFERENTIAL/PLATELET
Basophils Absolute: 0 10*3/uL (ref 0.0–0.1)
Basophils Relative: 0.3 % (ref 0.0–3.0)
Eosinophils Absolute: 0.1 10*3/uL (ref 0.0–0.7)
Eosinophils Relative: 1.7 % (ref 0.0–5.0)
HCT: 35.3 % — ABNORMAL LOW (ref 36.0–46.0)
Hemoglobin: 11.6 g/dL — ABNORMAL LOW (ref 12.0–15.0)
Lymphocytes Relative: 25.9 % (ref 12.0–46.0)
Lymphs Abs: 1.3 10*3/uL (ref 0.7–4.0)
MCHC: 32.9 g/dL (ref 30.0–36.0)
MCV: 80.1 fl (ref 78.0–100.0)
Monocytes Absolute: 0.4 10*3/uL (ref 0.1–1.0)
Monocytes Relative: 8.7 % (ref 3.0–12.0)
Neutro Abs: 3.1 10*3/uL (ref 1.4–7.7)
Neutrophils Relative %: 63.4 % (ref 43.0–77.0)
Platelets: 217 10*3/uL (ref 150.0–400.0)
RBC: 4.41 Mil/uL (ref 3.87–5.11)
RDW: 16 % — ABNORMAL HIGH (ref 11.5–14.6)
WBC: 4.8 10*3/uL (ref 4.5–10.5)

## 2014-02-25 LAB — COMPREHENSIVE METABOLIC PANEL
ALT: 14 U/L (ref 0–35)
AST: 15 U/L (ref 0–37)
Albumin: 3.8 g/dL (ref 3.5–5.2)
Alkaline Phosphatase: 69 U/L (ref 39–117)
BUN: 15 mg/dL (ref 6–23)
CO2: 28 mEq/L (ref 19–32)
Calcium: 9.1 mg/dL (ref 8.4–10.5)
Chloride: 104 mEq/L (ref 96–112)
Creatinine, Ser: 0.9 mg/dL (ref 0.4–1.2)
GFR: 69.06 mL/min (ref >60.00)
Glucose, Bld: 86 mg/dL (ref 70–99)
Potassium: 3.9 mEq/L (ref 3.5–5.1)
Sodium: 140 mEq/L (ref 135–145)
Total Bilirubin: 0.7 mg/dL (ref 0.3–1.2)
Total Protein: 7 g/dL (ref 6.0–8.3)

## 2014-02-25 LAB — HM PAP SMEAR: HM Pap smear: NORMAL

## 2014-02-25 LAB — TSH: TSH: 1.56 u[IU]/mL (ref 0.35–5.50)

## 2014-02-25 NOTE — Progress Notes (Signed)
Patient ID: Nancy Hamilton, female   DOB: 07-26-1946, 68 y.o.   MRN: [id number]  The patient is here for annual Medicare wellness examination and management of other chronic and acute problems.   The risk factors are reflected in the social history.  The roster of all physicians providing medical care to patient - is listed in the Snapshot section of the chart.  Activities of daily living:  The patient is 100% independent in all ADLs: dressing, toileting, feeding as well as independent mobility  Home safety : The patient has smoke detectors in the home. They wear seatbelts.  There are no firearms at home. There is no violence in the home.   There is no risks for hepatitis, STDs or HIV. There is no   history of blood transfusion. They have no travel history to infectious disease endemic areas of the world.  The patient has seen their dentist in the last six month. They have seen their eye doctor in the last year. They admit to slight hearing difficulty with regard to whispered voices and some television programs.  They have deferred audiologic testing in the last year.  They do not  have excessive sun exposure. Discussed the need for sun protection: hats, long sleeves and use of sunscreen if there is significant sun exposure.   Diet: the importance of a healthy diet is discussed. They do have a healthy diet.  The benefits of regular aerobic exercise were discussed. She walks 4 times per week ,  20 minutes.   Depression screen: there are no signs or vegative symptoms of depression- irritability, change in appetite, anhedonia, sadness/tearfullness.  Cognitive assessment: the patient manages all their financial and personal affairs and is actively engaged. They could relate day,date,year and events; recalled 2/3 objects at 3 minutes; performed clock-face test normally.  The following portions of the patient's history were reviewed and updated as appropriate: allergies, current medications, past  family history, past medical history,  past surgical history, past social history  and problem list.  Visual acuity was not assessed per patient preference since she has regular follow up with her ophthalmologist. Hearing and body mass index were assessed and reviewed.   During the course of the visit the patient was educated and counseled about appropriate screening and preventive services including : fall prevention , diabetes screening, nutrition counseling, colorectal cancer screening, and recommended immunizations.   Objective:   . General Appearance:    Alert, cooperative, no distress, appears stated age  Head:    Normocephalic, without obvious abnormality, atraumatic  Eyes:    PERRL, conjunctiva/corneas clear, EOM's intact, fundi    benign, both eyes  Ears:    Normal TM's and external ear canals, both ears  Nose:   Nares normal, septum midline, mucosa normal, no drainage    or sinus tenderness  Throat:   Lips, mucosa, and tongue normal; teeth and gums normal  Neck:   Supple, symmetrical, trachea midline, no adenopathy;    thyroid:  no enlargement/tenderness/nodules; no carotid   bruit or JVD  Back:     Symmetric, no curvature, ROM normal, no CVA tenderness  Lungs:     Clear to auscultation bilaterally, respirations unlabored  Chest Wall:    No tenderness or deformity   Heart:    Regular rate and rhythm, S1 and S2 normal, no murmur, rub   or gallop  Breast Exam:    No tenderness, masses, or nipple abnormality  Abdomen:     Soft, non-tender, bowel sounds  active all four quadrants,    no masses, no organomegaly  Genitalia:    Pelvic: cervix normal in appearance, external genitalia normal, no adnexal masses or tenderness, no cervical motion tenderness, rectovaginal septum normal, uterus normal size, shape, and consistency and vagina normal without discharge  Extremities:   Extremities normal, atraumatic, no cyanosis or edema  Pulses:   2+ and symmetric all extremities  Skin:   Skin  color, texture, turgor normal, no rashes or lesions  Lymph nodes:   Cervical, supraclavicular, and axillary nodes normal  Neurologic:   CNII-XII intact, normal strength, sensation and reflexes    throughout   Assessment and plan:   Routine general medical examination at a health care facility Annual comprehensive exam was done including breast, pelvic and PAP smear. All screenings have been addressed .   Other and unspecified hyperlipidemia Repeat testing today,  Suspension of stati nfiscussed and reassessment in 3 to 4 monghs   GERD (gastroesophageal reflux disease) S/p endoscopy,   Now managed with omeprazole 40 mg daily  Anemia Etiology unclear.  She is up to date on colonoscopy and had an EGD within the past year.  Will repeat with iron and b12 studies.  Colonic polyp Found on screening colonoscopy 2013.  Follow up is advised in 5 years  Cough Secondary to GERD   Updated Medication List Outpatient Encounter Prescriptions as of 02/25/2014  Medication Sig  . Cholecalciferol (HM VITAMIN D3) 2000 UNITS CAPS Take 1 capsule by mouth daily.  . Coenzyme Q10 (CO Q 10 PO) Take 200 mg by mouth daily.  Marland Kitchen FLUoxetine (PROZAC) 20 MG capsule take 1 capsule by mouth once daily  . levocetirizine (XYZAL) 5 MG tablet Take 1 tablet (5 mg total) by mouth every evening.  . Misc Natural Products (OSTEO BI-FLEX JOINT SHIELD PO) Take 1 tablet by mouth daily.  Marland Kitchen omeprazole (PRILOSEC) 40 MG capsule Take 1 capsule by mouth daily.  . sodium chloride (OCEAN) 0.65 % nasal spray Place 2 sprays into the nose as needed for congestion.  . pravastatin (PRAVACHOL) 20 MG tablet take 1 tablet by mouth every evening  . [DISCONTINUED] aspirin EC 81 MG tablet Take 81 mg by mouth daily.  . [DISCONTINUED] cetirizine (ZYRTEC) 10 MG tablet Take 10 mg by mouth daily.  . [DISCONTINUED] montelukast (SINGULAIR) 10 MG tablet Take 10 mg by mouth at bedtime.   . [DISCONTINUED] Olopatadine HCl (PATANASE) 0.6 % SOLN Instill 2  sprays into each nostril twice a day.  . [DISCONTINUED] pantoprazole (PROTONIX) 40 MG tablet TAKE 1 TABLET BY MOUTH ONCE DAILY 30 MINUTES BEFORE EATING AS DIRECTED  . [DISCONTINUED] pravastatin (PRAVACHOL) 20 MG tablet take 1 tablet by mouth every evening

## 2014-02-25 NOTE — Patient Instructions (Signed)
You had your annual Medicare wellness exam today.  A PAP smear was done.   We will schedule your 3 D  mammogram next January 9it may require going to [facility]!)   Referrals to Dr Oletta Lamas and Dr Evorn Gong are in place.     You received the Prevnar pneumonia vaccine today.  We will contact you with the bloodwork results.  We discussed suspending Pravachol, your cholesterol medication , after this month and repeating your fasting lipids in 3 or 4 months  To reassess your need  You can make that appt today for fasting labs

## 2014-02-25 NOTE — Assessment & Plan Note (Signed)
Annual comprehensive exam was done including breast, pelvic and PAP smear. All screenings have been addressed .  

## 2014-02-25 NOTE — Assessment & Plan Note (Signed)
Repeat testing today,  Suspension of stati nfiscussed and reassessment in 3 to 4 monghs

## 2014-02-26 ENCOUNTER — Encounter: Payer: Self-pay | Admitting: *Deleted

## 2014-02-26 DIAGNOSIS — D649 Anemia, unspecified: Secondary | ICD-10-CM | POA: Insufficient documentation

## 2014-02-26 LAB — VITAMIN D 25 HYDROXY (VIT D DEFICIENCY, FRACTURES): Vit D, 25-Hydroxy: 47 ng/mL (ref 30–89)

## 2014-02-28 DIAGNOSIS — D369 Benign neoplasm, unspecified site: Secondary | ICD-10-CM | POA: Insufficient documentation

## 2014-02-28 NOTE — Assessment & Plan Note (Signed)
Secondary to GERD  

## 2014-02-28 NOTE — Assessment & Plan Note (Signed)
S/p endoscopy,   Now managed with omeprazole 40 mg daily

## 2014-02-28 NOTE — Assessment & Plan Note (Signed)
Found on screening colonoscopy 2013.  Follow up is advised in 5 years

## 2014-02-28 NOTE — Assessment & Plan Note (Signed)
Etiology unclear.  She is up to date on colonoscopy and had an EGD within the past year.  Will repeat with iron and b12 studies.

## 2014-03-01 ENCOUNTER — Encounter: Payer: Self-pay | Admitting: Emergency Medicine

## 2014-03-01 ENCOUNTER — Encounter: Payer: Self-pay | Admitting: *Deleted

## 2014-03-17 ENCOUNTER — Other Ambulatory Visit: Payer: Self-pay | Admitting: Internal Medicine

## 2014-06-01 ENCOUNTER — Telehealth: Payer: Self-pay | Admitting: Internal Medicine

## 2014-06-01 DIAGNOSIS — Z79899 Other long term (current) drug therapy: Secondary | ICD-10-CM

## 2014-06-01 DIAGNOSIS — E785 Hyperlipidemia, unspecified: Secondary | ICD-10-CM

## 2014-06-01 NOTE — Telephone Encounter (Signed)
Tried to reach patient to let her know the labs were added line busy on home phone and mobile no voicemail.

## 2014-06-01 NOTE — Telephone Encounter (Signed)
Yes fasting lipids to assess panel since stopping her statin

## 2014-06-01 NOTE — Telephone Encounter (Signed)
The patient stated she is coming in for labs on Wednesday 7.22.15 wanting to know if she should have her cholesterol checked at this time . Please advise.

## 2014-06-01 NOTE — Telephone Encounter (Signed)
last Lipid 02/25/14 please advise?

## 2014-06-02 ENCOUNTER — Other Ambulatory Visit (INDEPENDENT_AMBULATORY_CARE_PROVIDER_SITE_OTHER): Payer: Commercial Managed Care - HMO

## 2014-06-02 DIAGNOSIS — D649 Anemia, unspecified: Secondary | ICD-10-CM

## 2014-06-02 DIAGNOSIS — E611 Iron deficiency: Secondary | ICD-10-CM

## 2014-06-02 DIAGNOSIS — E785 Hyperlipidemia, unspecified: Secondary | ICD-10-CM

## 2014-06-02 DIAGNOSIS — Z79899 Other long term (current) drug therapy: Secondary | ICD-10-CM

## 2014-06-02 DIAGNOSIS — E538 Deficiency of other specified B group vitamins: Secondary | ICD-10-CM

## 2014-06-02 LAB — LIPID PANEL
Cholesterol: 208 mg/dL — ABNORMAL HIGH (ref 0–200)
HDL: 58.9 mg/dL (ref >39.00)
LDL Cholesterol: 133 mg/dL — ABNORMAL HIGH (ref 0–99)
NonHDL: 149.1
Total CHOL/HDL Ratio: 4
Triglycerides: 83 mg/dL (ref 0.0–149.0)
VLDL: 16.6 mg/dL (ref 0.0–40.0)

## 2014-06-02 LAB — COMPREHENSIVE METABOLIC PANEL
ALT: 14 U/L (ref 0–35)
AST: 17 U/L (ref 0–37)
Albumin: 3.5 g/dL (ref 3.5–5.2)
Alkaline Phosphatase: 72 U/L (ref 39–117)
BUN: 13 mg/dL (ref 6–23)
CO2: 27 mEq/L (ref 19–32)
Calcium: 8.9 mg/dL (ref 8.4–10.5)
Chloride: 103 mEq/L (ref 96–112)
Creatinine, Ser: 0.9 mg/dL (ref 0.4–1.2)
GFR: 70.88 mL/min (ref >60.00)
Glucose, Bld: 90 mg/dL (ref 70–99)
Potassium: 4.2 mEq/L (ref 3.5–5.1)
Sodium: 140 mEq/L (ref 135–145)
Total Bilirubin: 0.5 mg/dL (ref 0.2–1.2)
Total Protein: 6.4 g/dL (ref 6.0–8.3)

## 2014-06-02 LAB — VITAMIN B12: Vitamin B-12: 181 pg/mL — ABNORMAL LOW (ref 211–911)

## 2014-06-02 LAB — FERRITIN: Ferritin: 5.2 ng/mL — ABNORMAL LOW (ref 10.0–291.0)

## 2014-06-03 ENCOUNTER — Other Ambulatory Visit: Payer: Self-pay | Admitting: Internal Medicine

## 2014-06-03 LAB — IRON AND TIBC
%SAT: 7 % — ABNORMAL LOW (ref 20–55)
Iron: 35 ug/dL — ABNORMAL LOW (ref 42–145)
TIBC: 502 ug/dL — ABNORMAL HIGH (ref 250–470)
UIBC: 467 ug/dL — ABNORMAL HIGH (ref 125–400)

## 2014-06-03 LAB — FOLATE RBC: RBC Folate: 820 ng/mL (ref >280)

## 2014-06-05 ENCOUNTER — Other Ambulatory Visit: Payer: Self-pay | Admitting: Internal Medicine

## 2014-06-07 DIAGNOSIS — E611 Iron deficiency: Secondary | ICD-10-CM | POA: Insufficient documentation

## 2014-06-07 DIAGNOSIS — E538 Deficiency of other specified B group vitamins: Secondary | ICD-10-CM | POA: Insufficient documentation

## 2014-06-07 HISTORY — DX: Iron deficiency: E61.1

## 2014-06-07 MED ORDER — FERROUS SULFATE 325 (65 FE) MG PO TABS: 325.0000 mg | ORAL_TABLET | Freq: Every day | ORAL | Status: DC

## 2014-06-07 MED ORDER — DOCUSATE SODIUM 100 MG PO CAPS: 100.0000 mg | ORAL_CAPSULE | Freq: Two times a day (BID) | ORAL | Status: DC

## 2014-06-07 NOTE — Addendum Note (Signed)
Addended by: Crecencio Mc on: 06/07/2014 07:13 PM   Modules accepted: Orders

## 2014-06-08 ENCOUNTER — Telehealth: Payer: Self-pay | Admitting: Internal Medicine

## 2014-06-08 ENCOUNTER — Ambulatory Visit (INDEPENDENT_AMBULATORY_CARE_PROVIDER_SITE_OTHER): Payer: Commercial Managed Care - HMO | Admitting: *Deleted

## 2014-06-08 ENCOUNTER — Other Ambulatory Visit: Payer: Self-pay | Admitting: Internal Medicine

## 2014-06-08 DIAGNOSIS — E538 Deficiency of other specified B group vitamins: Secondary | ICD-10-CM

## 2014-06-08 DIAGNOSIS — Z789 Other specified health status: Secondary | ICD-10-CM

## 2014-06-08 MED ORDER — CYANOCOBALAMIN 1000 MCG/ML IJ SOLN: INTRAMUSCULAR | Status: DC

## 2014-06-08 MED ORDER — CYANOCOBALAMIN 1000 MCG/ML IJ SOLN
1000.0000 ug | Freq: Once | INTRAMUSCULAR | Status: AC
  Administered 2014-06-08: 1000 ug via INTRAMUSCULAR

## 2014-06-08 MED ORDER — SYRINGE (DISPOSABLE) 1 ML MISC: Status: DC

## 2014-06-08 NOTE — Telephone Encounter (Signed)
Patient  Called back and stated she would rather come here for the injections for B 12, scheduled patient for 2 pm today please disregard previous lab note message for B12 but not the pravastatin patient has not taken for 3 months needs script for pravastatin patient stopped due to hot flashes.

## 2014-06-08 NOTE — Telephone Encounter (Signed)
Too late, b12   rx sent to pharmacy .  See prior message re pravastatin

## 2014-06-08 NOTE — Telephone Encounter (Signed)
Called and cancelled the B12 at pharmacy for patient.

## 2014-06-09 ENCOUNTER — Ambulatory Visit (INDEPENDENT_AMBULATORY_CARE_PROVIDER_SITE_OTHER): Payer: Commercial Managed Care - HMO | Admitting: *Deleted

## 2014-06-09 DIAGNOSIS — E538 Deficiency of other specified B group vitamins: Secondary | ICD-10-CM

## 2014-06-09 MED ORDER — CYANOCOBALAMIN 1000 MCG/ML IJ SOLN
1000.0000 ug | Freq: Once | INTRAMUSCULAR | Status: AC
  Administered 2014-06-09: 1000 ug via INTRAMUSCULAR

## 2014-06-10 ENCOUNTER — Ambulatory Visit (INDEPENDENT_AMBULATORY_CARE_PROVIDER_SITE_OTHER): Payer: Commercial Managed Care - HMO | Admitting: *Deleted

## 2014-06-10 DIAGNOSIS — E538 Deficiency of other specified B group vitamins: Secondary | ICD-10-CM

## 2014-06-10 MED ORDER — CYANOCOBALAMIN 1000 MCG/ML IJ SOLN
1000.0000 ug | Freq: Once | INTRAMUSCULAR | Status: AC
  Administered 2014-06-10: 1000 ug via INTRAMUSCULAR

## 2014-06-17 ENCOUNTER — Ambulatory Visit (INDEPENDENT_AMBULATORY_CARE_PROVIDER_SITE_OTHER): Payer: Commercial Managed Care - HMO | Admitting: *Deleted

## 2014-06-17 DIAGNOSIS — E538 Deficiency of other specified B group vitamins: Secondary | ICD-10-CM

## 2014-06-17 MED ORDER — CYANOCOBALAMIN 1000 MCG/ML IJ SOLN
1000.0000 ug | Freq: Once | INTRAMUSCULAR | Status: AC
  Administered 2014-06-17: 1000 ug via INTRAMUSCULAR

## 2014-06-24 ENCOUNTER — Ambulatory Visit (INDEPENDENT_AMBULATORY_CARE_PROVIDER_SITE_OTHER): Payer: Commercial Managed Care - HMO | Admitting: *Deleted

## 2014-06-24 DIAGNOSIS — E538 Deficiency of other specified B group vitamins: Secondary | ICD-10-CM

## 2014-06-24 MED ORDER — CYANOCOBALAMIN 1000 MCG/ML IJ SOLN
1000.0000 ug | Freq: Once | INTRAMUSCULAR | Status: AC
  Administered 2014-06-24: 1000 ug via INTRAMUSCULAR

## 2014-06-28 ENCOUNTER — Other Ambulatory Visit: Payer: Self-pay | Admitting: Internal Medicine

## 2014-07-27 ENCOUNTER — Ambulatory Visit (INDEPENDENT_AMBULATORY_CARE_PROVIDER_SITE_OTHER): Payer: Commercial Managed Care - HMO | Admitting: *Deleted

## 2014-07-27 DIAGNOSIS — E538 Deficiency of other specified B group vitamins: Secondary | ICD-10-CM

## 2014-07-27 MED ORDER — CYANOCOBALAMIN 1000 MCG/ML IJ SOLN
1000.0000 ug | Freq: Once | INTRAMUSCULAR | Status: AC
  Administered 2014-07-27: 1000 ug via INTRAMUSCULAR

## 2014-08-26 ENCOUNTER — Ambulatory Visit (INDEPENDENT_AMBULATORY_CARE_PROVIDER_SITE_OTHER): Payer: Medicare HMO | Admitting: *Deleted

## 2014-08-26 DIAGNOSIS — Z23 Encounter for immunization: Secondary | ICD-10-CM

## 2014-08-26 DIAGNOSIS — E538 Deficiency of other specified B group vitamins: Secondary | ICD-10-CM

## 2014-08-26 MED ORDER — CYANOCOBALAMIN 1000 MCG/ML IJ SOLN
1000.0000 ug | Freq: Once | INTRAMUSCULAR | Status: AC
  Administered 2014-08-26: 1000 ug via INTRAMUSCULAR

## 2014-09-17 ENCOUNTER — Other Ambulatory Visit: Payer: Self-pay | Admitting: Internal Medicine

## 2014-09-20 ENCOUNTER — Telehealth: Payer: Self-pay | Admitting: Internal Medicine

## 2014-09-27 ENCOUNTER — Ambulatory Visit (INDEPENDENT_AMBULATORY_CARE_PROVIDER_SITE_OTHER): Payer: Commercial Managed Care - HMO

## 2014-09-27 DIAGNOSIS — E538 Deficiency of other specified B group vitamins: Secondary | ICD-10-CM

## 2014-09-27 MED ORDER — CYANOCOBALAMIN 1000 MCG/ML IJ SOLN
1000.0000 ug | Freq: Once | INTRAMUSCULAR | Status: AC
  Administered 2014-09-27: 1000 ug via INTRAMUSCULAR

## 2014-10-26 ENCOUNTER — Ambulatory Visit (INDEPENDENT_AMBULATORY_CARE_PROVIDER_SITE_OTHER): Payer: Commercial Managed Care - HMO | Admitting: *Deleted

## 2014-10-26 DIAGNOSIS — E538 Deficiency of other specified B group vitamins: Secondary | ICD-10-CM

## 2014-10-26 MED ORDER — CYANOCOBALAMIN 1000 MCG/ML IJ SOLN
1000.0000 ug | Freq: Once | INTRAMUSCULAR | Status: AC
  Administered 2014-10-26: 1000 ug via INTRAMUSCULAR

## 2014-10-28 ENCOUNTER — Ambulatory Visit: Payer: Commercial Managed Care - HMO

## 2014-11-10 ENCOUNTER — Other Ambulatory Visit: Payer: Self-pay | Admitting: Internal Medicine

## 2014-11-10 NOTE — Telephone Encounter (Signed)
Ok to refill,  Refill sent  

## 2014-11-10 NOTE — Telephone Encounter (Signed)
Please advise refill? 

## 2014-11-16 ENCOUNTER — Telehealth: Payer: Self-pay | Admitting: Internal Medicine

## 2014-11-16 NOTE — Telephone Encounter (Signed)
Pt received letter stating that she needs a 3D mammogram. No order in the system. msn

## 2014-11-16 NOTE — Telephone Encounter (Signed)
Pt notified she can call and schedule her own appt,  verbalized understanding

## 2014-11-17 ENCOUNTER — Other Ambulatory Visit: Payer: Self-pay | Admitting: Internal Medicine

## 2014-11-30 ENCOUNTER — Ambulatory Visit (INDEPENDENT_AMBULATORY_CARE_PROVIDER_SITE_OTHER): Payer: PPO | Admitting: *Deleted

## 2014-11-30 DIAGNOSIS — E538 Deficiency of other specified B group vitamins: Secondary | ICD-10-CM

## 2014-11-30 MED ORDER — CYANOCOBALAMIN 1000 MCG/ML IJ SOLN
1000.0000 ug | Freq: Once | INTRAMUSCULAR | Status: AC
  Administered 2014-11-30: 1000 ug via INTRAMUSCULAR

## 2014-12-14 ENCOUNTER — Other Ambulatory Visit: Payer: Self-pay | Admitting: *Deleted

## 2014-12-14 MED ORDER — FLUOXETINE HCL 20 MG PO CAPS: 20.0000 mg | ORAL_CAPSULE | Freq: Every day | ORAL | Status: DC

## 2014-12-15 ENCOUNTER — Ambulatory Visit: Payer: Self-pay | Admitting: Internal Medicine

## 2014-12-15 LAB — HM MAMMOGRAPHY: HM Mammogram: NEGATIVE

## 2015-01-04 ENCOUNTER — Ambulatory Visit (INDEPENDENT_AMBULATORY_CARE_PROVIDER_SITE_OTHER): Payer: PPO | Admitting: *Deleted

## 2015-01-04 DIAGNOSIS — E538 Deficiency of other specified B group vitamins: Secondary | ICD-10-CM

## 2015-01-04 MED ORDER — CYANOCOBALAMIN 1000 MCG/ML IJ SOLN
1000.0000 ug | Freq: Once | INTRAMUSCULAR | Status: AC
  Administered 2015-01-04: 1000 ug via INTRAMUSCULAR

## 2015-01-13 ENCOUNTER — Other Ambulatory Visit: Payer: Self-pay | Admitting: *Deleted

## 2015-01-13 MED ORDER — LEVOCETIRIZINE DIHYDROCHLORIDE 5 MG PO TABS: 5.0000 mg | ORAL_TABLET | Freq: Every evening | ORAL | Status: DC

## 2015-02-08 ENCOUNTER — Ambulatory Visit (INDEPENDENT_AMBULATORY_CARE_PROVIDER_SITE_OTHER): Payer: PPO | Admitting: *Deleted

## 2015-02-08 DIAGNOSIS — E538 Deficiency of other specified B group vitamins: Secondary | ICD-10-CM

## 2015-02-08 MED ORDER — CYANOCOBALAMIN 1000 MCG/ML IJ SOLN
1000.0000 ug | Freq: Once | INTRAMUSCULAR | Status: AC
  Administered 2015-02-08: 1000 ug via INTRAMUSCULAR

## 2015-02-12 ENCOUNTER — Other Ambulatory Visit: Payer: Self-pay | Admitting: Internal Medicine

## 2015-02-14 NOTE — Telephone Encounter (Signed)
Appt 03/02/15

## 2015-03-02 ENCOUNTER — Ambulatory Visit (INDEPENDENT_AMBULATORY_CARE_PROVIDER_SITE_OTHER): Payer: PPO | Admitting: Internal Medicine

## 2015-03-02 ENCOUNTER — Encounter: Payer: Self-pay | Admitting: Internal Medicine

## 2015-03-02 ENCOUNTER — Encounter: Payer: Commercial Managed Care - HMO | Admitting: Internal Medicine

## 2015-03-02 VITALS — BP 100/58 | HR 57 | Temp 98.4°F | Resp 14 | Ht 62.0 in | Wt 135.2 lb

## 2015-03-02 DIAGNOSIS — F411 Generalized anxiety disorder: Secondary | ICD-10-CM

## 2015-03-02 DIAGNOSIS — R5383 Other fatigue: Secondary | ICD-10-CM

## 2015-03-02 DIAGNOSIS — E785 Hyperlipidemia, unspecified: Secondary | ICD-10-CM

## 2015-03-02 DIAGNOSIS — E559 Vitamin D deficiency, unspecified: Secondary | ICD-10-CM

## 2015-03-02 DIAGNOSIS — D649 Anemia, unspecified: Secondary | ICD-10-CM

## 2015-03-02 DIAGNOSIS — Z1159 Encounter for screening for other viral diseases: Secondary | ICD-10-CM

## 2015-03-02 DIAGNOSIS — E538 Deficiency of other specified B group vitamins: Secondary | ICD-10-CM

## 2015-03-02 DIAGNOSIS — Z0000 Encounter for general adult medical examination without abnormal findings: Secondary | ICD-10-CM

## 2015-03-02 LAB — COMPREHENSIVE METABOLIC PANEL
ALT: 14 U/L (ref 0–35)
AST: 18 U/L (ref 0–37)
Albumin: 4.2 g/dL (ref 3.5–5.2)
Alkaline Phosphatase: 80 U/L (ref 39–117)
BUN: 13 mg/dL (ref 6–23)
CO2: 31 mEq/L (ref 19–32)
Calcium: 9.4 mg/dL (ref 8.4–10.5)
Chloride: 103 mEq/L (ref 96–112)
Creatinine, Ser: 0.94 mg/dL (ref 0.40–1.20)
GFR: 62.97 mL/min (ref >60.00)
Glucose, Bld: 85 mg/dL (ref 70–99)
Potassium: 4 mEq/L (ref 3.5–5.1)
Sodium: 138 mEq/L (ref 135–145)
Total Bilirubin: 0.5 mg/dL (ref 0.2–1.2)
Total Protein: 6.8 g/dL (ref 6.0–8.3)

## 2015-03-02 LAB — LIPID PANEL
Cholesterol: 230 mg/dL — ABNORMAL HIGH (ref 0–200)
HDL: 65.5 mg/dL (ref >39.00)
LDL Cholesterol: 152 mg/dL — ABNORMAL HIGH (ref 0–99)
NonHDL: 164.5
Total CHOL/HDL Ratio: 4
Triglycerides: 63 mg/dL (ref 0.0–149.0)
VLDL: 12.6 mg/dL (ref 0.0–40.0)

## 2015-03-02 LAB — CBC WITH DIFFERENTIAL/PLATELET
Basophils Absolute: 0 10*3/uL (ref 0.0–0.1)
Basophils Relative: 0.6 % (ref 0.0–3.0)
Eosinophils Absolute: 0.1 10*3/uL (ref 0.0–0.7)
Eosinophils Relative: 3 % (ref 0.0–5.0)
HCT: 38.5 % (ref 36.0–46.0)
Hemoglobin: 13 g/dL (ref 12.0–15.0)
Lymphocytes Relative: 31.4 % (ref 12.0–46.0)
Lymphs Abs: 1.3 10*3/uL (ref 0.7–4.0)
MCHC: 33.7 g/dL (ref 30.0–36.0)
MCV: 85.1 fl (ref 78.0–100.0)
Monocytes Absolute: 0.4 10*3/uL (ref 0.1–1.0)
Monocytes Relative: 9.5 % (ref 3.0–12.0)
Neutro Abs: 2.3 10*3/uL (ref 1.4–7.7)
Neutrophils Relative %: 55.5 % (ref 43.0–77.0)
Platelets: 202 10*3/uL (ref 150.0–400.0)
RBC: 4.53 Mil/uL (ref 3.87–5.11)
RDW: 15 % (ref 11.5–15.5)
WBC: 4.2 10*3/uL (ref 4.0–10.5)

## 2015-03-02 LAB — VITAMIN D 25 HYDROXY (VIT D DEFICIENCY, FRACTURES): VITD: 47.83 ng/mL (ref 30.00–100.00)

## 2015-03-02 LAB — FERRITIN: Ferritin: 11.6 ng/mL (ref 10.0–291.0)

## 2015-03-02 NOTE — Progress Notes (Signed)
Pre-visit discussion using our clinic review tool. No additional management support is needed unless otherwise documented below in the visit note.  

## 2015-03-02 NOTE — Progress Notes (Signed)
Patient ID: Nancy Hamilton, female   DOB: Mar 09, 1946, 69 y.o.   MRN: [id number]  The patient is here for annual Medicare wellness examination and management of other chronic and acute problems.  Has been sleeping well,  Dreaming a  Lot. Mostly pleasant.  Husband  Has been on Hospice for 4 years,  Bedridden,  bedsores are starting to return due to malnutrition,  Mind is ok but he is aspirating more,  She cares for him,  Uses suction,   Provides nutrtion via syringe with Ensure She is seeing a p more pgressive decline.  Shy Drager syndrome , and catheterized    The risk factors are reflected in the social history.  The roster of all physicians providing medical care to patient - is listed in the Snapshot section of the chart.  Activities of daily living:  The patient is 100% independent in all ADLs: dressing, toileting, feeding as well as independent mobility  Home safety : The patient has smoke detectors in the home. They wear seatbelts.  There are no firearms at home. There is no violence in the home.   There is no risks for hepatitis, STDs or HIV. There is no   history of blood transfusion. They have no travel history to infectious disease endemic areas of the world.  The patient has seen their dentist in the last six month. They have seen their eye doctor in the last year. They admit to slight hearing difficulty with regard to whispered voices and some television programs.  They have deferred audiologic testing in the last year.  They do not  have excessive sun exposure. Discussed the need for sun protection: hats, long sleeves and use of sunscreen if there is significant sun exposure.   Diet: the importance of a healthy diet is discussed. They do have a healthy diet.  The benefits of regular aerobic exercise were discussed. She walks 4 times per week ,  20 minutes.   Depression screen: there are no signs or vegative symptoms of depression- irritability, change in appetite, anhedonia,  sadness/tearfullness.  Cognitive assessment: the patient manages all their financial and personal affairs and is actively engaged. They could relate day,date,year and events; recalled 2/3 objects at 3 minutes; performed clock-face test normally.  The following portions of the patient's history were reviewed and updated as appropriate: allergies, current medications, past family history, past medical history,  past surgical history, past social history  and problem list.  Visual acuity was not assessed per patient preference since she has regular follow up with her ophthalmologist. Hearing and body mass index were assessed and reviewed.   During the course of the visit the patient was educated and counseled about appropriate screening and preventive services including : fall prevention , diabetes screening, nutrition counseling, colorectal cancer screening, and recommended immunizations.    Review of Systems  Patient denies headache, fevers, malaise, unintentional weight loss, skin rash, eye pain, sinus congestion and sinus pain, sore throat, dysphagia,  hemoptysis , cough, dyspnea, wheezing, chest pain, palpitations, orthopnea, edema, abdominal pain, nausea, melena, diarrhea, constipation, flank pain, dysuria, hematuria, urinary  Frequency, nocturia, numbness, tingling, seizures,  Focal weakness, Loss of consciousness,  Tremor, insomnia, depression, anxiety, and suicidal ideation.    Objective:  BP 100/58 mmHg  Pulse 57  Temp(Src) 98.4 F (36.9 C) (Oral)  Resp 14  Ht 5\' 2"  (1.575 m)  Wt 135 lb 4 oz (61.349 kg)  BMI 24.73 kg/m2  SpO2 96%  General appearance: alert, cooperative and  appears stated age Head: Normocephalic, without obvious abnormality, atraumatic Eyes: conjunctivae/corneas clear. PERRL, EOM's intact. Fundi benign. Ears: normal TM's and external ear canals both ears Nose: Nares normal. Septum midline. Mucosa normal. No drainage or sinus tenderness. Throat: lips, mucosa, and  tongue normal; teeth and gums normal Neck: no adenopathy, no carotid bruit, no JVD, supple, symmetrical, trachea midline and thyroid not enlarged, symmetric, no tenderness/mass/nodules Lungs: clear to auscultation bilaterally Breasts: normal appearance, no masses or tenderness Heart: regular rate and rhythm, S1, S2 normal, no murmur, click, rub or gallop Abdomen: soft, non-tender; bowel sounds normal; no masses,  no organomegaly Extremities: extremities normal, atraumatic, no cyanosis or edema Pulses: 2+ and symmetric Skin: Skin color, texture, turgor normal. No rashes or lesions Neurologic: Alert and oriented X 3, normal strength and tone. Normal symmetric reflexes. Normal coordination and gait.   Assessment and Plan:  Problem List Items Addressed This Visit    Generalized anxiety disorder    Well controlled on current regimen of prozac..  no changes today.        Hyperlipidemia LDL goal <160    Mild, untreated due to statin intolerance .    Lab Results  Component Value Date   CHOL 230* 03/02/2015   HDL 65.50 03/02/2015   LDLCALC 152* 03/02/2015   TRIG 63.0 03/02/2015   CHOLHDL 4 03/02/2015         Routine general medical examination at a health care facility    Annual Medicare wellness  exam was done as well as a comprehensive physical exam and management of acute and chronic conditions .  During the course of the visit the patient was educated and counseled about appropriate screening and preventive services including : fall prevention , diabetes screening, nutrition counseling, colorectal cancer screening, and recommended immunizations.  Printed recommendations for health maintenance screenings was given.       Anemia   B12 deficiency    found during workup for anemia. B12 injections were started but lapsed. Advised to resume monthly injections.  Lab Results  Component Value Date   VITAMINB12 181* 06/02/2014           Other Visit Diagnoses    Vitamin D  deficiency    -  Primary    Relevant Orders    Lipid panel (Completed)    Vit D  25 hydroxy (rtn osteoporosis monitoring) (Completed)    Hyperlipidemia LDL goal <130        Relevant Orders    Lipid panel (Completed)    Need for hepatitis C screening test        Relevant Orders    Hepatitis C antibody (Completed)    Other fatigue        Relevant Orders    Comprehensive metabolic panel (Completed)

## 2015-03-02 NOTE — Patient Instructions (Addendum)
You can try Premier Protein vanilla and chocolate shakes that are thinner in consistency than Ensure.  160 cal  30  g protein   .Please take a probiotic ( Align, Flora que or Culturable) for 2 weeks  While you are on the antibiotic to prevent a serious antibiotic associated diarrhea  Called" clostridium difficile colitis" ( should also help prevent   vaginal yeast infection)    Health Maintenance Adopting a healthy lifestyle and getting preventive care can go a long way to promote health and wellness. Talk with your health care provider about what schedule of regular examinations is right for you. This is a good chance for you to check in with your provider about disease prevention and staying healthy. In between checkups, there are plenty of things you can do on your own. Experts have done a lot of research about which lifestyle changes and preventive measures are most likely to keep you healthy. Ask your health care provider for more information. WEIGHT AND DIET  Eat a healthy diet  Be sure to include plenty of vegetables, fruits, low-fat dairy products, and lean protein.  Do not eat a lot of foods high in solid fats, added sugars, or salt.  Get regular exercise. This is one of the most important things you can do for your health.  Most adults should exercise for at least 150 minutes each week. The exercise should increase your heart rate and make you sweat (moderate-intensity exercise).  Most adults should also do strengthening exercises at least twice a week. This is in addition to the moderate-intensity exercise.  Maintain a healthy weight  Body mass index (BMI) is a measurement that can be used to identify possible weight problems. It estimates body fat based on height and weight. Your health care provider can help determine your BMI and help you achieve or maintain a healthy weight.  For females 65 years of age and older:   A BMI below 18.5 is considered underweight.  A BMI of  18.5 to 24.9 is normal.  A BMI of 25 to 29.9 is considered overweight.  A BMI of 30 and above is considered obese.  Watch levels of cholesterol and blood lipids  You should start having your blood tested for lipids and cholesterol at 68 years of age, then have this test every 5 years.  You may need to have your cholesterol levels checked more often if:  Your lipid or cholesterol levels are high.  You are older than 68 years of age.  You are at high risk for heart disease.  CANCER SCREENING   Lung Cancer  Lung cancer screening is recommended for adults 36-39 years old who are at high risk for lung cancer because of a history of smoking.  A yearly low-dose CT scan of the lungs is recommended for people who:  Currently smoke.  Have quit within the past 15 years.  Have at least a 30-pack-year history of smoking. A pack year is smoking an average of one pack of cigarettes a day for 1 year.  Yearly screening should continue until it has been 15 years since you quit.  Yearly screening should stop if you develop a health problem that would prevent you from having lung cancer treatment.  Breast Cancer  Practice breast self-awareness. This means understanding how your breasts normally appear and feel.  It also means doing regular breast self-exams. Let your health care provider know about any changes, no matter how small.  If you are in  your 20s or 30s, you should have a clinical breast exam (CBE) by a health care provider every 1-3 years as part of a regular health exam.  If you are 110 or older, have a CBE every year. Also consider having a breast X-ray (mammogram) every year.  If you have a family history of breast cancer, talk to your health care provider about genetic screening.  If you are at high risk for breast cancer, talk to your health care provider about having an MRI and a mammogram every year.  Breast cancer gene (BRCA) assessment is recommended for women who  have family members with BRCA-related cancers. BRCA-related cancers include:  Breast.  Ovarian.  Tubal.  Peritoneal cancers.  Results of the assessment will determine the need for genetic counseling and BRCA1 and BRCA2 testing. Cervical Cancer Routine pelvic examinations to screen for cervical cancer are no longer recommended for nonpregnant women who are considered low risk for cancer of the pelvic organs (ovaries, uterus, and vagina) and who do not have symptoms. A pelvic examination may be necessary if you have symptoms including those associated with pelvic infections. Ask your health care provider if a screening pelvic exam is right for you.   The Pap test is the screening test for cervical cancer for women who are considered at risk.  If you had a hysterectomy for a problem that was not cancer or a condition that could lead to cancer, then you no longer need Pap tests.  If you are older than 65 years, and you have had normal Pap tests for the past 10 years, you no longer need to have Pap tests.  If you have had past treatment for cervical cancer or a condition that could lead to cancer, you need Pap tests and screening for cancer for at least 20 years after your treatment.  If you no longer get a Pap test, assess your risk factors if they change (such as having a new sexual partner). This can affect whether you should start being screened again.  Some women have medical problems that increase their chance of getting cervical cancer. If this is the case for you, your health care provider may recommend more frequent screening and Pap tests.  The human papillomavirus (HPV) test is another test that may be used for cervical cancer screening. The HPV test looks for the virus that can cause cell changes in the cervix. The cells collected during the Pap test can be tested for HPV.  The HPV test can be used to screen women 38 years of age and older. Getting tested for HPV can extend the  interval between normal Pap tests from three to five years.  An HPV test also should be used to screen women of any age who have unclear Pap test results.  After 68 years of age, women should have HPV testing as often as Pap tests.  Colorectal Cancer  This type of cancer can be detected and often prevented.  Routine colorectal cancer screening usually begins at 67 years of age and continues through 68 years of age.  Your health care provider may recommend screening at an earlier age if you have risk factors for colon cancer.  Your health care provider may also recommend using home test kits to check for hidden blood in the stool.  A small camera at the end of a tube can be used to examine your colon directly (sigmoidoscopy or colonoscopy). This is done to check for the earliest forms of  colorectal cancer.  Routine screening usually begins at age 39.  Direct examination of the colon should be repeated every 5-10 years through 68 years of age. However, you may need to be screened more often if early forms of precancerous polyps or small growths are found. Skin Cancer  Check your skin from head to toe regularly.  Tell your health care provider about any new moles or changes in moles, especially if there is a change in a mole's shape or color.  Also tell your health care provider if you have a mole that is larger than the size of a pencil eraser.  Always use sunscreen. Apply sunscreen liberally and repeatedly throughout the day.  Protect yourself by wearing long sleeves, pants, a wide-brimmed hat, and sunglasses whenever you are outside. HEART DISEASE, DIABETES, AND HIGH BLOOD PRESSURE   Have your blood pressure checked at least every 1-2 years. High blood pressure causes heart disease and increases the risk of stroke.  If you are between 62 years and 22 years old, ask your health care provider if you should take aspirin to prevent strokes.  Have regular diabetes screenings. This  involves taking a blood sample to check your fasting blood sugar level.  If you are at a normal weight and have a low risk for diabetes, have this test once every three years after 68 years of age.  If you are overweight and have a high risk for diabetes, consider being tested at a younger age or more often. PREVENTING INFECTION  Hepatitis B  If you have a higher risk for hepatitis B, you should be screened for this virus. You are considered at high risk for hepatitis B if:  You were born in a country where hepatitis B is common. Ask your health care provider which countries are considered high risk.  Your parents were born in a high-risk country, and you have not been immunized against hepatitis B (hepatitis B vaccine).  You have HIV or AIDS.  You use needles to inject street drugs.  You live with someone who has hepatitis B.  You have had sex with someone who has hepatitis B.  You get hemodialysis treatment.  You take certain medicines for conditions, including cancer, organ transplantation, and autoimmune conditions. Hepatitis C  Blood testing is recommended for:  Everyone born from [birth year] through 1964.  Anyone with known risk factors for hepatitis C. Sexually transmitted infections (STIs)  You should be screened for sexually transmitted infections (STIs) including gonorrhea and chlamydia if:  You are sexually active and are younger than 68 years of age.  You are older than 68 years of age and your health care provider tells you that you are at risk for this type of infection.  Your sexual activity has changed since you were last screened and you are at an increased risk for chlamydia or gonorrhea. Ask your health care provider if you are at risk.  If you do not have HIV, but are at risk, it may be recommended that you take a prescription medicine daily to prevent HIV infection. This is called pre-exposure prophylaxis (PrEP). You are considered at risk if:  You are  sexually active and do not regularly use condoms or know the HIV status of your partner(s).  You take drugs by injection.  You are sexually active with a partner who has HIV. Talk with your health care provider about whether you are at high risk of being infected with HIV. If you choose to begin PrEP,  you should first be tested for HIV. You should then be tested every 3 months for as long as you are taking PrEP.  PREGNANCY   If you are premenopausal and you may become pregnant, ask your health care provider about preconception counseling.  If you may become pregnant, take 400 to 800 micrograms (mcg) of folic acid every day.  If you want to prevent pregnancy, talk to your health care provider about birth control (contraception). OSTEOPOROSIS AND MENOPAUSE   Osteoporosis is a disease in which the bones lose minerals and strength with aging. This can result in serious bone fractures. Your risk for osteoporosis can be identified using a bone density scan.  If you are 65 years of age or older, or if you are at risk for osteoporosis and fractures, ask your health care provider if you should be screened.  Ask your health care provider whether you should take a calcium or vitamin D supplement to lower your risk for osteoporosis.  Menopause may have certain physical symptoms and risks.  Hormone replacement therapy may reduce some of these symptoms and risks. Talk to your health care provider about whether hormone replacement therapy is right for you.  HOME CARE INSTRUCTIONS   Schedule regular health, dental, and eye exams.  Stay current with your immunizations.   Do not use any tobacco products including cigarettes, chewing tobacco, or electronic cigarettes.  If you are pregnant, do not drink alcohol.  If you are breastfeeding, limit how much and how often you drink alcohol.  Limit alcohol intake to no more than 1 drink per day for nonpregnant women. One drink equals 12 ounces of beer, 5  ounces of wine, or 1 ounces of hard liquor.  Do not use street drugs.  Do not share needles.  Ask your health care provider for help if you need support or information about quitting drugs.  Tell your health care provider if you often feel depressed.  Tell your health care provider if you have ever been abused or do not feel safe at home. Document Released: 05/14/2011 Document Revised: 03/15/2014 Document Reviewed: 09/30/2013 [facility] Patient Information 2015 [place], [place]. This information is not intended to replace advice given to you by your health care provider. Make sure you discuss any questions you have with your health care provider.

## 2015-03-03 LAB — IRON AND TIBC
%SAT: 12 % — ABNORMAL LOW (ref 20–55)
Iron: 47 ug/dL (ref 42–145)
TIBC: 378 ug/dL (ref 250–470)
UIBC: 331 ug/dL (ref 125–400)

## 2015-03-03 LAB — HEPATITIS C ANTIBODY: HCV Ab: NEGATIVE

## 2015-03-04 ENCOUNTER — Encounter: Payer: Self-pay | Admitting: *Deleted

## 2015-03-05 ENCOUNTER — Encounter: Payer: Self-pay | Admitting: Internal Medicine

## 2015-03-05 NOTE — Assessment & Plan Note (Signed)
Well controlled on current regimen of prozac..  no changes today.

## 2015-03-05 NOTE — Assessment & Plan Note (Signed)

## 2015-03-05 NOTE — Assessment & Plan Note (Addendum)
found during workup for anemia. B12 injections were started but lapsed. Advised to resume monthly injections.  Lab Results  Component Value Date   VITAMINB12 181* 06/02/2014

## 2015-03-05 NOTE — Assessment & Plan Note (Signed)
Mild, untreated due to statin intolerance .    Lab Results  Component Value Date   CHOL 230* 03/02/2015   HDL 65.50 03/02/2015   LDLCALC 152* 03/02/2015   TRIG 63.0 03/02/2015   CHOLHDL 4 03/02/2015

## 2015-03-15 ENCOUNTER — Ambulatory Visit (INDEPENDENT_AMBULATORY_CARE_PROVIDER_SITE_OTHER): Payer: PPO | Admitting: *Deleted

## 2015-03-15 DIAGNOSIS — E538 Deficiency of other specified B group vitamins: Secondary | ICD-10-CM

## 2015-03-15 MED ORDER — CYANOCOBALAMIN 1000 MCG/ML IJ SOLN
1000.0000 ug | Freq: Once | INTRAMUSCULAR | Status: AC
  Administered 2015-03-15: 1000 ug via INTRAMUSCULAR

## 2015-03-16 ENCOUNTER — Other Ambulatory Visit: Payer: Self-pay | Admitting: Internal Medicine

## 2015-03-29 ENCOUNTER — Other Ambulatory Visit: Payer: Self-pay | Admitting: Internal Medicine

## 2015-04-19 ENCOUNTER — Ambulatory Visit (INDEPENDENT_AMBULATORY_CARE_PROVIDER_SITE_OTHER): Payer: PPO | Admitting: *Deleted

## 2015-04-19 DIAGNOSIS — E538 Deficiency of other specified B group vitamins: Secondary | ICD-10-CM | POA: Diagnosis not present

## 2015-04-19 MED ORDER — CYANOCOBALAMIN 1000 MCG/ML IJ SOLN
1000.0000 ug | Freq: Once | INTRAMUSCULAR | Status: AC
  Administered 2015-04-19: 1000 ug via INTRAMUSCULAR

## 2015-05-09 ENCOUNTER — Other Ambulatory Visit: Payer: Self-pay | Admitting: Internal Medicine

## 2015-05-24 ENCOUNTER — Ambulatory Visit (INDEPENDENT_AMBULATORY_CARE_PROVIDER_SITE_OTHER): Payer: PPO

## 2015-05-24 DIAGNOSIS — E538 Deficiency of other specified B group vitamins: Secondary | ICD-10-CM

## 2015-05-24 MED ORDER — CYANOCOBALAMIN 1000 MCG/ML IJ SOLN
1000.0000 ug | Freq: Once | INTRAMUSCULAR | Status: AC
  Administered 2015-05-24: 1000 ug via INTRAMUSCULAR

## 2015-06-28 ENCOUNTER — Ambulatory Visit (INDEPENDENT_AMBULATORY_CARE_PROVIDER_SITE_OTHER): Payer: PPO

## 2015-06-28 DIAGNOSIS — E538 Deficiency of other specified B group vitamins: Secondary | ICD-10-CM

## 2015-06-28 MED ORDER — CYANOCOBALAMIN 1000 MCG/ML IJ SOLN
1000.0000 ug | Freq: Once | INTRAMUSCULAR | Status: AC
  Administered 2015-06-28: 1000 ug via INTRAMUSCULAR

## 2015-06-28 NOTE — Progress Notes (Signed)
Patient came in for B12 injection.  Received injection in Left Deltoid.  Patient tolerated well.

## 2015-07-11 ENCOUNTER — Ambulatory Visit (INDEPENDENT_AMBULATORY_CARE_PROVIDER_SITE_OTHER): Payer: PPO

## 2015-07-11 ENCOUNTER — Encounter: Payer: Self-pay | Admitting: Podiatry

## 2015-07-11 ENCOUNTER — Ambulatory Visit (INDEPENDENT_AMBULATORY_CARE_PROVIDER_SITE_OTHER): Payer: PPO | Admitting: Podiatry

## 2015-07-11 ENCOUNTER — Other Ambulatory Visit: Payer: Self-pay | Admitting: Internal Medicine

## 2015-07-11 VITALS — BP 121/60 | HR 62 | Resp 12

## 2015-07-11 DIAGNOSIS — M79672 Pain in left foot: Secondary | ICD-10-CM

## 2015-07-11 DIAGNOSIS — M722 Plantar fascial fibromatosis: Secondary | ICD-10-CM

## 2015-07-11 MED ORDER — MELOXICAM 15 MG PO TABS: 15.0000 mg | ORAL_TABLET | Freq: Every day | ORAL | Status: DC

## 2015-07-11 MED ORDER — METHYLPREDNISOLONE 4 MG PO TBPK: ORAL_TABLET | ORAL | Status: DC

## 2015-07-11 NOTE — Progress Notes (Signed)
   Subjective:    Patient ID: Nancy Hamilton, female    DOB: 25-Nov-1945, 69 y.o.   MRN: [id number]  HPI: She presents today with a chief complaint of a painful plantar left heel. She states this been this way for approximately 3 months and seems to be getting worse. Over the last couple of days it has felt the best that it has in the white some time. She states that she is trying some therapy some of her friends have talked about. She is utilizing a night splint she is also utilizing stretching therapy and tennis shoes. She states that the pain is worse in the morning and again in the evening.    Review of Systems  Gastrointestinal: Positive for constipation.  Hematological: Bruises/bleeds easily.  All other systems reviewed and are negative.      Objective:   Physical Exam: 69 year old female chief complaint of pain to her left heel. No acute distress vital signs are stable she is alert and oriented 3. Pulses are strong and palpable. Neurologic sensorium is intact per Semmes-Weinstein monofilament. Deep tendon reflexes are intact bilateral muscle strength is 5 over 5 dorsiflexion plantar flexors and inverters everters all intrinsic musculature is intact. Orthopedic evaluation of a straight solid joints distal to the ankle for range of motion without crepitation. Cutaneous evaluation of a straight supple well-hydrated cutis no erythema edema saline as drainage or odor she has pain on palpation medial calcaneal tubercle of the left heel. Radiographs 3 views taken today do demonstrate a thickening of the soft tissue of the plantar fascia at its insertion site on the left heel. Plantar distally oriented calcaneal heel spurs also noted. Cutaneous evaluation demonstrates supple well-hydrated cutis no erythema edema saline as drainage or odor.        Assessment & Plan:  Assessment: Plantar fasciitis left foot.  Plan: Discussed etiology pathology conservative versus surgical therapies. At this  point I injected her left heel started her on a Medrol Dosepak to be followed by meloxicam. Sherri has a night splint at time and the compression anklet. We discussed appropriate shoe gear stretching exercises ice therapy and shoe gear modifications. I will follow-up with her in 1 month.

## 2015-07-31 ENCOUNTER — Other Ambulatory Visit: Payer: Self-pay | Admitting: Internal Medicine

## 2015-08-02 ENCOUNTER — Ambulatory Visit (INDEPENDENT_AMBULATORY_CARE_PROVIDER_SITE_OTHER): Payer: PPO

## 2015-08-02 DIAGNOSIS — E538 Deficiency of other specified B group vitamins: Secondary | ICD-10-CM

## 2015-08-02 MED ORDER — CYANOCOBALAMIN 1000 MCG/ML IJ SOLN
1000.0000 ug | Freq: Once | INTRAMUSCULAR | Status: AC
  Administered 2015-08-02: 1000 ug via INTRAMUSCULAR

## 2015-08-02 NOTE — Progress Notes (Signed)
Patient came in for b12 injection.  Received in right arm.  Patient tolerated well.

## 2015-08-08 ENCOUNTER — Encounter: Payer: Self-pay | Admitting: Podiatry

## 2015-08-08 ENCOUNTER — Ambulatory Visit (INDEPENDENT_AMBULATORY_CARE_PROVIDER_SITE_OTHER): Payer: PPO | Admitting: Podiatry

## 2015-08-08 VITALS — BP 108/51 | HR 72 | Resp 16

## 2015-08-08 DIAGNOSIS — M722 Plantar fascial fibromatosis: Secondary | ICD-10-CM | POA: Diagnosis not present

## 2015-08-08 NOTE — Progress Notes (Signed)
She presents today 1 month follow-up plantar fasciitis of her left heel. She states there have absolutely no pain and it's great.  Objective: Vital signs are stable she is alert and oriented 3. Pulses are strongly palpable. She has no pain on palpation medial calcaneal tubercle of the left heel. There is no erythema and no edema cellulitis drainage or odor.  Assessment: Well healing plantar fasciitis left foot.  Plan: Continue the use of our conservative therapies including her plantar fascial brace for plantar fascial night splint and her oral medication for one more month. I will follow-up with her should she develop breakthrough pain or recurrence of the plantar fasciitis.  Roselind Messier DPM

## 2015-09-06 ENCOUNTER — Ambulatory Visit (INDEPENDENT_AMBULATORY_CARE_PROVIDER_SITE_OTHER): Payer: PPO

## 2015-09-06 DIAGNOSIS — Z23 Encounter for immunization: Secondary | ICD-10-CM

## 2015-09-06 DIAGNOSIS — E538 Deficiency of other specified B group vitamins: Secondary | ICD-10-CM

## 2015-09-06 MED ORDER — CYANOCOBALAMIN 1000 MCG/ML IJ SOLN: 1000.0000 ug | Freq: Once | INTRAMUSCULAR | Status: DC

## 2015-09-06 MED ORDER — CYANOCOBALAMIN 1000 MCG/ML IJ SOLN
1000.0000 ug | Freq: Once | INTRAMUSCULAR | Status: AC
  Administered 2015-09-06: 1000 ug via INTRAMUSCULAR

## 2015-09-06 NOTE — Progress Notes (Signed)
Pt came in today for her B12. She received the shot in her left arm. Pt tolerated well.

## 2015-09-07 ENCOUNTER — Telehealth: Payer: Self-pay | Admitting: *Deleted

## 2015-09-07 NOTE — Telephone Encounter (Signed)
Patient requested that her B-12 not be called in to the pharmacy, patient rather come in to get B-12.

## 2015-09-07 NOTE — Telephone Encounter (Signed)
Patient is coming into office for B12 injections.

## 2015-10-09 ENCOUNTER — Other Ambulatory Visit: Payer: Self-pay | Admitting: Internal Medicine

## 2015-10-11 ENCOUNTER — Ambulatory Visit (INDEPENDENT_AMBULATORY_CARE_PROVIDER_SITE_OTHER): Payer: PPO | Admitting: *Deleted

## 2015-10-11 DIAGNOSIS — E538 Deficiency of other specified B group vitamins: Secondary | ICD-10-CM

## 2015-10-11 MED ORDER — CYANOCOBALAMIN 1000 MCG/ML IJ SOLN
1000.0000 ug | Freq: Once | INTRAMUSCULAR | Status: AC
  Administered 2015-10-11: 1000 ug via INTRAMUSCULAR

## 2015-10-11 NOTE — Progress Notes (Signed)
Patient presented for B 12 injection given in right deltoid patient tolerated well. 

## 2015-10-18 ENCOUNTER — Telehealth: Payer: Self-pay

## 2015-10-18 MED ORDER — ZOSTER VACCINE LIVE 19400 UNT/0.65ML ~~LOC~~ SOLR: 0.6500 mL | Freq: Once | SUBCUTANEOUS | Status: DC

## 2015-10-18 NOTE — Telephone Encounter (Signed)
Left message on voicemail.

## 2015-10-18 NOTE — Telephone Encounter (Signed)
Pt is requesting a zostavax rx for shingles  be sent to her pharmacy so she can get this injection.

## 2015-10-18 NOTE — Telephone Encounter (Signed)
DONE

## 2015-11-15 ENCOUNTER — Ambulatory Visit (INDEPENDENT_AMBULATORY_CARE_PROVIDER_SITE_OTHER): Payer: PPO | Admitting: *Deleted

## 2015-11-15 DIAGNOSIS — E538 Deficiency of other specified B group vitamins: Secondary | ICD-10-CM

## 2015-11-15 MED ORDER — CYANOCOBALAMIN 1000 MCG/ML IJ SOLN
1000.0000 ug | Freq: Once | INTRAMUSCULAR | Status: AC
  Administered 2015-11-15: 1000 ug via INTRAMUSCULAR

## 2015-11-15 NOTE — Addendum Note (Signed)
Addended by: Nanci Pina on: 11/15/2015 09:12 AM   Modules accepted: Orders

## 2015-11-15 NOTE — Progress Notes (Signed)
/  Patient presented foer B 12 injection to right deltoid patient tolerated well.

## 2015-11-16 ENCOUNTER — Encounter: Payer: Self-pay | Admitting: *Deleted

## 2015-11-28 ENCOUNTER — Encounter
Admission: RE | Disposition: A | Discharge status: Home / Self Care | Payer: Self-pay | Source: Ambulatory Visit | Attending: Ophthalmology

## 2015-11-28 ENCOUNTER — Ambulatory Visit: Payer: PPO | Admitting: *Deleted

## 2015-11-28 ENCOUNTER — Ambulatory Visit
Admission: RE | Admit: 2015-11-28 | Discharge: 2015-11-28 | Disposition: A | Discharge status: Home / Self Care | Payer: PPO | Source: Ambulatory Visit | Attending: Ophthalmology | Admitting: Ophthalmology

## 2015-11-28 DIAGNOSIS — F419 Anxiety disorder, unspecified: Secondary | ICD-10-CM | POA: Insufficient documentation

## 2015-11-28 DIAGNOSIS — K219 Gastro-esophageal reflux disease without esophagitis: Secondary | ICD-10-CM | POA: Insufficient documentation

## 2015-11-28 DIAGNOSIS — H2511 Age-related nuclear cataract, right eye: Secondary | ICD-10-CM | POA: Diagnosis not present

## 2015-11-28 DIAGNOSIS — Z888 Allergy status to other drugs, medicaments and biological substances status: Secondary | ICD-10-CM | POA: Insufficient documentation

## 2015-11-28 DIAGNOSIS — D649 Anemia, unspecified: Secondary | ICD-10-CM | POA: Diagnosis not present

## 2015-11-28 DIAGNOSIS — Z85828 Personal history of other malignant neoplasm of skin: Secondary | ICD-10-CM | POA: Diagnosis not present

## 2015-11-28 DIAGNOSIS — F329 Major depressive disorder, single episode, unspecified: Secondary | ICD-10-CM | POA: Insufficient documentation

## 2015-11-28 DIAGNOSIS — Z87891 Personal history of nicotine dependence: Secondary | ICD-10-CM | POA: Diagnosis not present

## 2015-11-28 DIAGNOSIS — H2513 Age-related nuclear cataract, bilateral: Secondary | ICD-10-CM | POA: Diagnosis not present

## 2015-11-28 HISTORY — DX: Malignant (primary) neoplasm, unspecified: C80.1

## 2015-11-28 HISTORY — PX: CATARACT EXTRACTION W/PHACO: SHX586

## 2015-11-28 HISTORY — DX: Cough, unspecified: R05.9

## 2015-11-28 HISTORY — DX: Anemia, unspecified: D64.9

## 2015-11-28 HISTORY — DX: Cough: R05

## 2015-11-28 SURGERY — PHACOEMULSIFICATION, CATARACT, WITH IOL INSERTION
Anesthesia: Monitor Anesthesia Care | Site: Eye | Laterality: Right | Wound class: Clean

## 2015-11-28 MED ORDER — TETRACAINE HCL 0.5 % OP SOLN
OPHTHALMIC | Status: DC | PRN
  Administered 2015-11-28: 1 [drp] via OPHTHALMIC

## 2015-11-28 MED ORDER — NA CHONDROIT SULF-NA HYALURON 40-17 MG/ML IO SOLN
INTRAOCULAR | Status: DC | PRN
  Administered 2015-11-28: 1 mL via INTRAOCULAR

## 2015-11-28 MED ORDER — CYCLOPENTOLATE HCL 2 % OP SOLN
1.0000 [drp] | OPHTHALMIC | Status: AC | PRN
  Administered 2015-11-28 (×4): 1 [drp] via OPHTHALMIC

## 2015-11-28 MED ORDER — SODIUM CHLORIDE 0.9 % IV SOLN
INTRAVENOUS | Status: DC
  Administered 2015-11-28: 11:00:00 via INTRAVENOUS

## 2015-11-28 MED ORDER — CEFUROXIME OPHTHALMIC INJECTION 1 MG/0.1 ML
INJECTION | OPHTHALMIC | Status: DC | PRN
  Administered 2015-11-28: .1 mL via INTRACAMERAL

## 2015-11-28 MED ORDER — ONDANSETRON HCL 4 MG/2ML IJ SOLN
INTRAMUSCULAR | Status: DC | PRN
  Administered 2015-11-28: 4 mg via INTRAVENOUS

## 2015-11-28 MED ORDER — CARBACHOL 0.01 % IO SOLN
INTRAOCULAR | Status: DC | PRN
  Administered 2015-11-28: .5 mL via INTRAOCULAR

## 2015-11-28 MED ORDER — MOXIFLOXACIN HCL 0.5 % OP SOLN
1.0000 [drp] | OPHTHALMIC | Status: AC | PRN
  Administered 2015-11-28 (×3): 1 [drp] via OPHTHALMIC

## 2015-11-28 MED ORDER — LIDOCAINE HCL (PF) 4 % IJ SOLN
INTRAOCULAR | Status: DC | PRN
  Administered 2015-11-28: .5 mL via OPHTHALMIC

## 2015-11-28 MED ORDER — LIDOCAINE HCL (PF) 4 % IJ SOLN
INTRAMUSCULAR | Status: DC | PRN
  Administered 2015-11-28: 4 mL via OPHTHALMIC

## 2015-11-28 MED ORDER — ALFENTANIL 500 MCG/ML IJ INJ
INJECTION | INTRAMUSCULAR | Status: DC | PRN
  Administered 2015-11-28: 500 ug via INTRAVENOUS

## 2015-11-28 MED ORDER — MOXIFLOXACIN HCL 0.5 % OP SOLN
OPHTHALMIC | Status: DC | PRN
  Administered 2015-11-28: 1 [drp] via OPHTHALMIC

## 2015-11-28 MED ORDER — PHENYLEPHRINE HCL 10 % OP SOLN
1.0000 [drp] | OPHTHALMIC | Status: AC | PRN
  Administered 2015-11-28 (×4): 1 [drp] via OPHTHALMIC

## 2015-11-28 MED ORDER — EPINEPHRINE HCL 1 MG/ML IJ SOLN
INTRAMUSCULAR | Status: DC | PRN
  Administered 2015-11-28: 1 mL via OPHTHALMIC

## 2015-11-28 MED ORDER — MIDAZOLAM HCL 2 MG/2ML IJ SOLN
INTRAMUSCULAR | Status: DC | PRN
  Administered 2015-11-28: 1 mg via INTRAVENOUS

## 2015-11-28 SURGICAL SUPPLY — 30 items
CANNULA ANT/CHMB 27GA (MISCELLANEOUS) ×2 IMPLANT
CORD BIP STRL DISP 12FT (MISCELLANEOUS) ×2 IMPLANT
CUP MEDICINE 2OZ PLAST GRAD ST (MISCELLANEOUS) ×2 IMPLANT
DRAPE XRAY CASSETTE 23X24 (DRAPES) ×2 IMPLANT
ERASER HMR WETFIELD 18G (MISCELLANEOUS) ×2 IMPLANT
GLOVE BIO SURGEON STRL SZ8 (GLOVE) ×2 IMPLANT
GLOVE SURG LX 6.5 MICRO (GLOVE) ×1
GLOVE SURG LX 8.0 MICRO (GLOVE) ×1
GLOVE SURG LX STRL 6.5 MICRO (GLOVE) ×1 IMPLANT
GLOVE SURG LX STRL 8.0 MICRO (GLOVE) ×1 IMPLANT
GOWN STRL REUS W/ TWL LRG LVL3 (GOWN DISPOSABLE) ×1 IMPLANT
GOWN STRL REUS W/ TWL XL LVL3 (GOWN DISPOSABLE) ×1 IMPLANT
GOWN STRL REUS W/TWL LRG LVL3 (GOWN DISPOSABLE) ×2
GOWN STRL REUS W/TWL XL LVL3 (GOWN DISPOSABLE) ×2
LENS IOL ACRSF IQ ULTRA 23.5 (Intraocular Lens) IMPLANT
LENS IOL ACRYSOF IQ 23.5 (Intraocular Lens) ×2 IMPLANT
PACK CATARACT (MISCELLANEOUS) ×2 IMPLANT
PACK CATARACT DINGLEDEIN LX (MISCELLANEOUS) ×2 IMPLANT
PACK EYE AFTER SURG (MISCELLANEOUS) ×2 IMPLANT
SHLD EYE VISITEC  UNIV (MISCELLANEOUS) ×2 IMPLANT
SOL BSS BAG (MISCELLANEOUS) ×2
SOL PREP PVP 2OZ (MISCELLANEOUS) ×2
SOLUTION BSS BAG (MISCELLANEOUS) ×1 IMPLANT
SOLUTION PREP PVP 2OZ (MISCELLANEOUS) ×1 IMPLANT
SUT SILK 5-0 (SUTURE) ×2 IMPLANT
SYR 3ML LL SCALE MARK (SYRINGE) ×2 IMPLANT
SYR 5ML LL (SYRINGE) ×2 IMPLANT
SYR TB 1ML 27GX1/2 LL (SYRINGE) ×2 IMPLANT
WATER STERILE IRR 1000ML POUR (IV SOLUTION) ×2 IMPLANT
WIPE NON LINTING 3.25X3.25 (MISCELLANEOUS) ×2 IMPLANT

## 2015-11-28 NOTE — Anesthesia Preprocedure Evaluation (Signed)
Anesthesia Evaluation  Patient identified by MRN, date of birth, ID band Patient awake    Reviewed: Allergy & Precautions, NPO status , Patient's Chart, lab work & pertinent test results  Airway Mallampati: II  TM Distance: >3 FB Neck ROM: Limited    Dental  (+) Teeth Intact, Caps   Pulmonary former smoker,    Pulmonary exam normal        Cardiovascular Exercise Tolerance: Good Normal cardiovascular exam     Neuro/Psych Anxiety Depression    GI/Hepatic GERD  Medicated and Controlled,  Endo/Other    Renal/GU      Musculoskeletal   Abdominal Normal abdominal exam  (+)   Peds  Hematology   Anesthesia Other Findings   Reproductive/Obstetrics                             Anesthesia Physical Anesthesia Plan  ASA: II  Anesthesia Plan: MAC   Post-op Pain Management:    Induction: Intravenous  Airway Management Planned: Nasal Cannula  Additional Equipment:   Intra-op Plan:   Post-operative Plan:   Informed Consent: I have reviewed the patients History and Physical, chart, labs and discussed the procedure including the risks, benefits and alternatives for the proposed anesthesia with the patient or authorized representative who has indicated his/her understanding and acceptance.     Plan Discussed with: CRNA  Anesthesia Plan Comments:         Anesthesia Quick Evaluation

## 2015-11-28 NOTE — Transfer of Care (Signed)
Immediate Anesthesia Transfer of Care Note  Patient: Nancy Hamilton  Procedure(s) Performed: Procedure(s) with comments: CATARACT EXTRACTION PHACO AND INTRAOCULAR LENS PLACEMENT (IOC) (Right) - [place] AP% CDE fluid pack lot # [id number]  Patient Location: PACU  Anesthesia Type:MAC  Level of Consciousness: awake  Airway & Oxygen Therapy: Patient Spontanous Breathing and Patient connected to nasal cannula oxygen  Post-op Assessment: Report given to RN and Post -op Vital signs reviewed and stable  Post vital signs: Reviewed and stable  Last Vitals:  Filed Vitals:   11/28/15 1057  BP: 134/72  Pulse: 83  Temp: 36.7 C  Resp: 16    Complications: No apparent anesthesia complications

## 2015-11-28 NOTE — Anesthesia Procedure Notes (Signed)
Procedure Name: MAC Date/Time: 11/28/2015 12:35 PM Performed by: Allean Found Pre-anesthesia Checklist: Patient identified, Emergency Drugs available, Suction available, Patient being monitored and Timeout performed Patient Re-evaluated:Patient Re-evaluated prior to inductionOxygen Delivery Method: Nasal cannula Intubation Type: IV induction Dental Injury: Teeth and Oropharynx as per pre-operative assessment

## 2015-11-28 NOTE — Anesthesia Postprocedure Evaluation (Signed)
Anesthesia Post Note  Patient: CAMMIE KNOTT  Procedure(s) Performed: Procedure(s) (LRB): CATARACT EXTRACTION PHACO AND INTRAOCULAR LENS PLACEMENT (IOC) (Right)  Patient location during evaluation: PACU Anesthesia Type: MAC Level of consciousness: awake Pain management: pain level controlled Vital Signs Assessment: post-procedure vital signs reviewed and stable Respiratory status: spontaneous breathing Cardiovascular status: blood pressure returned to baseline Postop Assessment: no headache and no backache Anesthetic complications: no    Last Vitals:  Filed Vitals:   11/28/15 1057  BP: 134/72  Pulse: 83  Temp: 36.7 C  Resp: 16    Last Pain: There were no vitals filed for this visit.               Buckner Malta

## 2015-11-28 NOTE — Op Note (Signed)
Date of Surgery: 11/28/2015 Date of Dictation: 11/28/2015 1:02 PM Pre-operative Diagnosis:  Nuclear Sclerotic Cataract right Eye Post-operative Diagnosis: same Procedure performed: Extra-capsular Cataract Extraction (ECCE) with placement of a posterior chamber intraocular lens (IOL) right Eye IOL: * No implants in log * Anesthesia: 2% Lidocaine and 4% Marcaine in a 50/50 mixture with 10 unites/ml of Hylenex given as a peribulbar Anesthesiologist: Anesthesiologist: Elyse Hsu, MD CRNA: Allean Found, CRNA Complications: none Estimated Blood Loss: less than 1 ml  Description of procedure:  The patient was given anesthesia and sedation via intravenous access. The patient was then prepped and draped in the usual fashion. A 25-gauge needle was bent for initiating the capsulorhexis. A 5-0 silk suture was placed through the conjunctiva superior and inferiorly to serve as bridle sutures. Hemostasis was obtained at the superior limbus using an eraser cautery. A partial thickness groove was made at the anterior surgical limbus with a 64 Beaver blade and this was dissected anteriorly with an [employer]. The anterior chamber was entered at 10 o'clock with a 1.0 mm paracentesis knife and through the lamellar dissection with a 2.6 mm Alcon keratome. Epi-Shugarcaine 0.5 CC [9 cc BSS Plus (Alcon), 3 cc 4% preservative-free lidocaine (Hospira) and 4 cc 1:1000 preservative-free, bisulfite-free epinephrine] was injected into the anterior chamber via the paracentesis tract. Epi-Shugarcaine 0.5 CC [9 cc BSS Plus (Alcon), 3 cc 4% preservative-free lidocaine (Hospira) and 4 cc 1:1000 preservative-free, bisulfite-free epinephrine] was injected into the anterior chamber via the paracentesis tract. DiscoVisc was injected to replace the aqueous and a continuous tear curvilinear capsulorhexis was performed using a bent 25-gauge needle.  Balance salt on a syringe was used to perform hydro-dissection and phacoemulsification  was carried out using a divide and conquer technique. Procedure(s) with comments: CATARACT EXTRACTION PHACO AND INTRAOCULAR LENS PLACEMENT (IOC) (Right) - [place] AP% CDE fluid pack lot # [id number]. Irrigation/aspiration was used to remove the residual cortex and the capsular bag was inflated with DiscoVisc. The intraocular lens was inserted into the capsular bag using a pre-loaded UltraSert Delivery System. Irrigation/aspiration was used to remove the residual DiscoVisc. The wound was inflated with balanced salt and checked for leaks. None were found. Miostat was injected via the paracentesis track and 0.1 ml of cefuroxime containing 1 mg of drug  was injected via the paracentesis track. The wound was checked for leaks again and none were found.   The bridal sutures were removed and two drops of Vigamox were placed on the eye. An eye shield was placed to protect the eye and the patient was discharged to the recovery area in good condition.   Marcile Fuquay MD

## 2015-11-28 NOTE — Interval H&P Note (Signed)
History and Physical Interval Note:  11/28/2015 12:16 PM  Nancy Hamilton  has presented today for surgery, with the diagnosis of CATARACT  The various methods of treatment have been discussed with the patient and family. After consideration of risks, benefits and other options for treatment, the patient has consented to  Procedure(s): CATARACT EXTRACTION PHACO AND INTRAOCULAR LENS PLACEMENT ([place]) (Right) as a surgical intervention .  The patient's history has been reviewed, patient examined, no change in status, stable for surgery.  I have reviewed the patient's chart and labs.  Questions were answered to the patient's satisfaction.     Sevana Grandinetti

## 2015-11-28 NOTE — Discharge Instructions (Signed)
AMBULATORY SURGERY  DISCHARGE INSTRUCTIONS   1) The drugs that you were given will stay in your system until tomorrow so for the next 24 hours you should not:  A) Drive an automobile B) Make any legal decisions C) Drink any alcoholic beverage   2) You may resume regular meals tomorrow.  Today it is better to start with liquids and gradually work up to solid foods.  You may eat anything you prefer, but it is better to start with liquids, then soup and crackers, and gradually work up to solid foods.   3) Please notify your doctor immediately if you have any unusual bleeding, trouble breathing, redness and pain at the surgery site, drainage, fever, or pain not relieved by medication.    4) Additional Instructions:        Please contact your physician with any problems or Same Day Surgery at [phone], Monday through Friday 6 am to 4 pm, or [place] [facility] number at [phone]. Eye Surgery Discharge Instructions  Expect mild scratchy sensation or mild soreness. DO NOT RUB YOUR EYE!  The day of surgery:  Minimal physical activity, but bed rest is not required  No reading, computer work, or close hand work  No bending, lifting, or straining.  May watch TV  For 24 hours:  No driving, legal decisions, or alcoholic beverages  Safety precautions  Eat anything you prefer: It is better to start with liquids, then soup then solid foods.  _____ Eye patch should be worn until postoperative exam tomorrow.  ____ Solar shield eyeglasses should be worn for comfort in the sunlight/patch while sleeping  Resume all regular medications including aspirin or Coumadin if these were discontinued prior to surgery. You may shower, bathe, shave, or wash your hair. Tylenol may be taken for mild discomfort.  Call your doctor if you experience significant pain, nausea, or vomiting, fever > 101 or other signs of infection. [phone] or [phone] Specific  instructions:  Follow-up Information    Follow up with Laya Letendre, MD In 1 day.   Specialty:  Ophthalmology   Why:  January 16 at 9:20am   Contact information:   [address]   [place] [address] [phone]

## 2015-11-28 NOTE — H&P (Signed)
See scanned note.

## 2015-12-08 DIAGNOSIS — H2512 Age-related nuclear cataract, left eye: Secondary | ICD-10-CM | POA: Diagnosis not present

## 2015-12-10 ENCOUNTER — Other Ambulatory Visit: Payer: Self-pay | Admitting: Internal Medicine

## 2015-12-12 NOTE — Telephone Encounter (Signed)
NO OV since 03/02/15 ok to fill.

## 2015-12-13 NOTE — Telephone Encounter (Signed)
Refill for 30 days only on the fluoxetine  OFFICE VISIT NEEDED prior to any more refills

## 2015-12-14 NOTE — H&P (Signed)
See scanned note.

## 2015-12-18 NOTE — H&P (Signed)
See scanned note.

## 2015-12-19 ENCOUNTER — Ambulatory Visit
Admission: RE | Admit: 2015-12-19 | Discharge: 2015-12-19 | Disposition: A | Discharge status: Home / Self Care | Payer: PPO | Source: Ambulatory Visit | Attending: Ophthalmology | Admitting: Ophthalmology

## 2015-12-19 ENCOUNTER — Encounter: Payer: Self-pay | Admitting: *Deleted

## 2015-12-19 ENCOUNTER — Encounter
Admission: RE | Disposition: A | Discharge status: Home / Self Care | Payer: Self-pay | Source: Ambulatory Visit | Attending: Ophthalmology

## 2015-12-19 ENCOUNTER — Ambulatory Visit: Payer: PPO | Admitting: Anesthesiology

## 2015-12-19 DIAGNOSIS — Z9841 Cataract extraction status, right eye: Secondary | ICD-10-CM | POA: Insufficient documentation

## 2015-12-19 DIAGNOSIS — H2512 Age-related nuclear cataract, left eye: Secondary | ICD-10-CM | POA: Diagnosis not present

## 2015-12-19 DIAGNOSIS — Z87891 Personal history of nicotine dependence: Secondary | ICD-10-CM | POA: Insufficient documentation

## 2015-12-19 DIAGNOSIS — Z85828 Personal history of other malignant neoplasm of skin: Secondary | ICD-10-CM | POA: Diagnosis not present

## 2015-12-19 DIAGNOSIS — R05 Cough: Secondary | ICD-10-CM | POA: Diagnosis not present

## 2015-12-19 DIAGNOSIS — K219 Gastro-esophageal reflux disease without esophagitis: Secondary | ICD-10-CM | POA: Insufficient documentation

## 2015-12-19 DIAGNOSIS — F329 Major depressive disorder, single episode, unspecified: Secondary | ICD-10-CM | POA: Diagnosis not present

## 2015-12-19 DIAGNOSIS — Z888 Allergy status to other drugs, medicaments and biological substances status: Secondary | ICD-10-CM | POA: Diagnosis not present

## 2015-12-19 DIAGNOSIS — F418 Other specified anxiety disorders: Secondary | ICD-10-CM | POA: Diagnosis not present

## 2015-12-19 DIAGNOSIS — D649 Anemia, unspecified: Secondary | ICD-10-CM | POA: Insufficient documentation

## 2015-12-19 HISTORY — PX: CATARACT EXTRACTION W/PHACO: SHX586

## 2015-12-19 SURGERY — PHACOEMULSIFICATION, CATARACT, WITH IOL INSERTION
Anesthesia: Monitor Anesthesia Care | Site: Eye | Laterality: Left | Wound class: Clean

## 2015-12-19 MED ORDER — CARBACHOL 0.01 % IO SOLN
INTRAOCULAR | Status: DC | PRN
  Administered 2015-12-19: 0.5 mL via INTRAOCULAR

## 2015-12-19 MED ORDER — EPINEPHRINE HCL 1 MG/ML IJ SOLN
INTRAMUSCULAR | Status: AC
  Filled 2015-12-19: qty 2

## 2015-12-19 MED ORDER — MOXIFLOXACIN HCL 0.5 % OP SOLN
OPHTHALMIC | Status: AC
  Administered 2015-12-19: 1 [drp] via OPHTHALMIC
  Filled 2015-12-19: qty 3

## 2015-12-19 MED ORDER — TETRACAINE HCL 0.5 % OP SOLN
OPHTHALMIC | Status: DC | PRN
  Administered 2015-12-19: 2 [drp] via OPHTHALMIC

## 2015-12-19 MED ORDER — NA CHONDROIT SULF-NA HYALURON 40-17 MG/ML IO SOLN
INTRAOCULAR | Status: AC
  Filled 2015-12-19: qty 1

## 2015-12-19 MED ORDER — LIDOCAINE HCL (PF) 4 % IJ SOLN
INTRAOCULAR | Status: DC | PRN
  Administered 2015-12-19: 4 mL via OPHTHALMIC

## 2015-12-19 MED ORDER — MOXIFLOXACIN HCL 0.5 % OP SOLN
1.0000 [drp] | Freq: Once | OPHTHALMIC | Status: AC
  Administered 2015-12-19: 1 [drp] via OPHTHALMIC

## 2015-12-19 MED ORDER — CEFUROXIME OPHTHALMIC INJECTION 1 MG/0.1 ML
INJECTION | OPHTHALMIC | Status: AC
  Filled 2015-12-19: qty 0.1

## 2015-12-19 MED ORDER — PHENYLEPHRINE HCL 10 % OP SOLN
OPHTHALMIC | Status: AC
  Administered 2015-12-19: 1 [drp] via OPHTHALMIC
  Filled 2015-12-19: qty 5

## 2015-12-19 MED ORDER — PHENYLEPHRINE HCL 10 % OP SOLN
1.0000 [drp] | OPHTHALMIC | Status: AC | PRN
  Administered 2015-12-19: 1 [drp] via OPHTHALMIC

## 2015-12-19 MED ORDER — LIDOCAINE HCL (PF) 4 % IJ SOLN
INTRAMUSCULAR | Status: AC
  Filled 2015-12-19: qty 5

## 2015-12-19 MED ORDER — TETRACAINE HCL 0.5 % OP SOLN
OPHTHALMIC | Status: AC
  Filled 2015-12-19: qty 2

## 2015-12-19 MED ORDER — LIDOCAINE HCL (PF) 4 % IJ SOLN
INTRAMUSCULAR | Status: DC | PRN
  Administered 2015-12-19: 4 mL via OPHTHALMIC

## 2015-12-19 MED ORDER — CEFUROXIME OPHTHALMIC INJECTION 1 MG/0.1 ML
INJECTION | OPHTHALMIC | Status: DC | PRN
  Administered 2015-12-19: 1 mg via INTRACAMERAL

## 2015-12-19 MED ORDER — ALFENTANIL 500 MCG/ML IJ INJ
INJECTION | INTRAMUSCULAR | Status: DC | PRN
  Administered 2015-12-19: 500 ug via INTRAVENOUS
  Administered 2015-12-19: 100 ug via INTRAVENOUS

## 2015-12-19 MED ORDER — EPINEPHRINE HCL 1 MG/ML IJ SOLN
INTRAOCULAR | Status: DC | PRN
  Administered 2015-12-19: 250 mL via OPHTHALMIC

## 2015-12-19 MED ORDER — BUPIVACAINE HCL (PF) 0.75 % IJ SOLN
INTRAMUSCULAR | Status: AC
  Filled 2015-12-19: qty 10

## 2015-12-19 MED ORDER — MIDAZOLAM HCL 2 MG/2ML IJ SOLN
INTRAMUSCULAR | Status: DC | PRN
  Administered 2015-12-19: 1 mg via INTRAVENOUS

## 2015-12-19 MED ORDER — NA CHONDROIT SULF-NA HYALURON 40-17 MG/ML IO SOLN
INTRAOCULAR | Status: DC | PRN
  Administered 2015-12-19: 1 mL via INTRAOCULAR

## 2015-12-19 MED ORDER — CYCLOPENTOLATE HCL 2 % OP SOLN
1.0000 [drp] | OPHTHALMIC | Status: AC | PRN
  Administered 2015-12-19: 1 [drp] via OPHTHALMIC

## 2015-12-19 MED ORDER — MOXIFLOXACIN HCL 0.5 % OP SOLN
OPHTHALMIC | Status: DC | PRN
  Administered 2015-12-19: 4 [drp] via OPHTHALMIC

## 2015-12-19 MED ORDER — CYCLOPENTOLATE HCL 2 % OP SOLN
OPHTHALMIC | Status: AC
  Administered 2015-12-19: 1 [drp] via OPHTHALMIC
  Filled 2015-12-19: qty 2

## 2015-12-19 MED ORDER — ONDANSETRON HCL 4 MG/2ML IJ SOLN
INTRAMUSCULAR | Status: DC | PRN
  Administered 2015-12-19: 4 mg via INTRAVENOUS

## 2015-12-19 MED ORDER — HYALURONIDASE HUMAN 150 UNIT/ML IJ SOLN
INTRAMUSCULAR | Status: AC
  Filled 2015-12-19: qty 1

## 2015-12-19 MED ORDER — SODIUM CHLORIDE 0.9 % IV SOLN
INTRAVENOUS | Status: DC
  Administered 2015-12-19: 08:00:00 via INTRAVENOUS

## 2015-12-19 SURGICAL SUPPLY — 31 items
CANNULA ANT/CHMB 27G (MISCELLANEOUS) ×1 IMPLANT
CANNULA ANT/CHMB 27GA (MISCELLANEOUS) ×4 IMPLANT
CORD BIP STRL DISP 12FT (MISCELLANEOUS) ×2 IMPLANT
CUP MEDICINE 2OZ PLAST GRAD ST (MISCELLANEOUS) ×2 IMPLANT
DRAPE XRAY CASSETTE 23X24 (DRAPES) ×2 IMPLANT
ERASER HMR WETFIELD 18G (MISCELLANEOUS) ×2 IMPLANT
GLOVE BIO SURGEON STRL SZ8 (GLOVE) ×2 IMPLANT
GLOVE SURG LX 6.5 MICRO (GLOVE) ×1
GLOVE SURG LX 8.0 MICRO (GLOVE) ×1
GLOVE SURG LX STRL 6.5 MICRO (GLOVE) ×1 IMPLANT
GLOVE SURG LX STRL 8.0 MICRO (GLOVE) ×1 IMPLANT
GOWN STRL REUS W/ TWL LRG LVL3 (GOWN DISPOSABLE) ×1 IMPLANT
GOWN STRL REUS W/ TWL XL LVL3 (GOWN DISPOSABLE) ×1 IMPLANT
GOWN STRL REUS W/TWL LRG LVL3 (GOWN DISPOSABLE) ×2
GOWN STRL REUS W/TWL XL LVL3 (GOWN DISPOSABLE) ×2
LENS IOL ACRSF IQ ULTRA 23.5 (Intraocular Lens) IMPLANT
LENS IOL ACRYSOF IQ 23.5 (Intraocular Lens) ×2 IMPLANT
PACK CATARACT (MISCELLANEOUS) ×2 IMPLANT
PACK CATARACT DINGLEDEIN LX (MISCELLANEOUS) ×2 IMPLANT
PACK EYE AFTER SURG (MISCELLANEOUS) ×2 IMPLANT
SHLD EYE VISITEC  UNIV (MISCELLANEOUS) ×2 IMPLANT
SOL BSS BAG (MISCELLANEOUS) ×2
SOL PREP PVP 2OZ (MISCELLANEOUS) ×2
SOLUTION BSS BAG (MISCELLANEOUS) ×1 IMPLANT
SOLUTION PREP PVP 2OZ (MISCELLANEOUS) ×1 IMPLANT
SUT SILK 5-0 (SUTURE) ×2 IMPLANT
SYR 3ML LL SCALE MARK (SYRINGE) ×2 IMPLANT
SYR 5ML LL (SYRINGE) ×2 IMPLANT
SYR TB 1ML 27GX1/2 LL (SYRINGE) ×2 IMPLANT
WATER STERILE IRR 1000ML POUR (IV SOLUTION) ×2 IMPLANT
WIPE NON LINTING 3.25X3.25 (MISCELLANEOUS) ×2 IMPLANT

## 2015-12-19 NOTE — Interval H&P Note (Signed)
History and Physical Interval Note:  12/19/2015 7:28 AM  Nancy Hamilton  has presented today for surgery, with the diagnosis of CATARACT  The various methods of treatment have been discussed with the patient and family. After consideration of risks, benefits and other options for treatment, the patient has consented to  Procedure(s): CATARACT EXTRACTION PHACO AND INTRAOCULAR LENS PLACEMENT ([place]) (Left) as a surgical intervention .  The patient's history has been reviewed, patient examined, no change in status, stable for surgery.  I have reviewed the patient's chart and labs.  Questions were answered to the patient's satisfaction.     [name]

## 2015-12-19 NOTE — Anesthesia Preprocedure Evaluation (Signed)
Anesthesia Evaluation  Patient identified by MRN, date of birth, ID band Patient awake    Reviewed: Allergy & Precautions, NPO status , Patient's Chart, lab work & pertinent test results, reviewed documented beta blocker date and time   Airway Mallampati: II  TM Distance: >3 FB     Dental  (+) Chipped   Pulmonary former smoker,           Cardiovascular      Neuro/Psych PSYCHIATRIC DISORDERS Anxiety Depression    GI/Hepatic GERD  Controlled,  Endo/Other    Renal/GU      Musculoskeletal   Abdominal   Peds  Hematology  (+) anemia ,   Anesthesia Other Findings   Reproductive/Obstetrics                             Anesthesia Physical Anesthesia Plan  ASA: III  Anesthesia Plan: MAC   Post-op Pain Management:    Induction:   Airway Management Planned:   Additional Equipment:   Intra-op Plan:   Post-operative Plan:   Informed Consent: I have reviewed the patients History and Physical, chart, labs and discussed the procedure including the risks, benefits and alternatives for the proposed anesthesia with the patient or authorized representative who has indicated his/her understanding and acceptance.     Plan Discussed with: CRNA  Anesthesia Plan Comments:         Anesthesia Quick Evaluation

## 2015-12-19 NOTE — Anesthesia Postprocedure Evaluation (Signed)
Anesthesia Post Note  Patient: Nancy Hamilton  Procedure(s) Performed: Procedure(s) (LRB): CATARACT EXTRACTION PHACO AND INTRAOCULAR LENS PLACEMENT ([facility]) (Left)  Patient location during evaluation: Short Stay Anesthesia Type: MAC Level of consciousness: awake Pain management: pain level controlled Vital Signs Assessment: post-procedure vital signs reviewed and stable Respiratory status: spontaneous breathing Cardiovascular status: blood pressure returned to baseline Postop Assessment: no headache    Last Vitals:  Filed Vitals:   12/19/15 0718 12/19/15 0911  BP: 129/67 123/63  Pulse: 59 54  Temp: 36.9 C 36.1 C  Resp: 16 16    Last Pain:  Filed Vitals:   12/19/15 0912  PainSc: 0-No pain                 Buckner Malta

## 2015-12-19 NOTE — Discharge Instructions (Signed)
AMBULATORY SURGERY  DISCHARGE INSTRUCTIONS   1) The drugs that you were given will stay in your system until tomorrow so for the next 24 hours you should not:  A) Drive an automobile B) Make any legal decisions C) Drink any alcoholic beverage   2) You may resume regular meals tomorrow.  Today it is better to start with liquids and gradually work up to solid foods.  You may eat anything you prefer, but it is better to start with liquids, then soup and crackers, and gradually work up to solid foods.   3) Please notify your doctor immediately if you have any unusual bleeding, trouble breathing, redness and pain at the surgery site, drainage, fever, or pain not relieved by medication. 4)   5) Your post-operative visit with Dr.                                     is: Date:                        Time:    Please call to schedule your post-operative visit.  6) Additional Instructions: Eye Surgery Discharge Instructions  Expect mild scratchy sensation or mild soreness. DO NOT RUB YOUR EYE!  The day of surgery: Minimal physical activity, but bed rest is not required No reading, computer work, or close hand work No bending, lifting, or straining. May watch TV  For 24 hours: No driving, legal decisions, or alcoholic beverages Safety precautions Eat anything you prefer: It is better to start with liquids, then soup then solid foods. _____ Eye patch should be worn until postoperative exam tomorrow. ____ Solar shield eyeglasses should be worn for comfort in the sunlight/patch while sleeping  Resume all regular medications including aspirin or Coumadin if these were discontinued prior to surgery. You may shower, bathe, shave, or wash your hair. Tylenol may be taken for mild discomfort.  Call your doctor if you experience significant pain, nausea, or vomiting, fever > 101 or other signs of infection. [phone] or [phone] Specific instructions:  Follow-up Information    Follow up with DINGELDEIN,STEVEN, MD In 1 day.   Specialty:  Ophthalmology   Why:  February 7 at 9:40am   Contact information:   [address]   [place] [address] [phone]

## 2015-12-19 NOTE — Op Note (Signed)
Date of Surgery: 12/19/2015 Date of Dictation: 12/19/2015 9:07 AM Pre-operative Diagnosis:  Nuclear Sclerotic Cataract left Eye Post-operative Diagnosis: same Procedure performed: Extra-capsular Cataract Extraction (ECCE) with placement of a posterior chamber intraocular lens (IOL) left Eye IOL:  Implant Name Type Inv. Item Serial No. Manufacturer Lot No. LRB No. Used  LENS IOL ACRYSOF IQ 23.5 - [id number] Intraocular Lens LENS IOL ACRYSOF IQ 23.5 [id number] ALCON   Left 1   Anesthesia: 2% Lidocaine and 4% Marcaine in a 50/50 mixture with 10 unites/ml of Hylenex given as a peribulbar Anesthesiologist: Anesthesiologist: Gunnar Bulla, MD CRNA: Allean Found, CRNA Complications: none Estimated Blood Loss: less than 1 ml  Description of procedure:  The patient was given anesthesia and sedation via intravenous access. The patient was then prepped and draped in the usual fashion. A 25-gauge needle was bent for initiating the capsulorhexis. A 5-0 silk suture was placed through the conjunctiva superior and inferiorly to serve as bridle sutures. Hemostasis was obtained at the superior limbus using an eraser cautery. A partial thickness groove was made at the anterior surgical limbus with a 64 Beaver blade and this was dissected anteriorly with an [employer]. The anterior chamber was entered at 10 o'clock with a 1.0 mm paracentesis knife and through the lamellar dissection with a 2.6 mm Alcon keratome. Epi-Shugarcaine 0.5 CC [9 cc BSS Plus (Alcon), 3 cc 4% preservative-free lidocaine (Hospira) and 4 cc 1:1000 preservative-free, bisulfite-free epinephrine] was injected into the anterior chamber via the paracentesis tract. Epi-Shugarcaine 0.5 CC [9 cc BSS Plus (Alcon), 3 cc 4% preservative-free lidocaine (Hospira) and 4 cc 1:1000 preservative-free, bisulfite-free epinephrine] was injected into the anterior chamber via the paracentesis tract. DiscoVisc was injected to replace the aqueous and a  continuous tear curvilinear capsulorhexis was performed using a bent 25-gauge needle.  Balance salt on a syringe was used to perform hydro-dissection and phacoemulsification was carried out using a divide and conquer technique. Procedure(s) with comments: CATARACT EXTRACTION PHACO AND INTRAOCULAR LENS PLACEMENT ([facility]) (Left) - US:1:08.2 AP%:56.7% CDE:28.10 Lot# [id number]. Irrigation/aspiration was used to remove the residual cortex and the capsular bag was inflated with DiscoVisc. The intraocular lens was inserted into the capsular bag using a pre-loaded UltraSert Delivery System. Irrigation/aspiration was used to remove the residual DiscoVisc. The wound was inflated with balanced salt and checked for leaks. None were found. Miostat was injected via the paracentesis track and 0.1 ml of cefuroxime containing 1 mg of drug  was injected via the paracentesis track. The wound was checked for leaks again and none were found.   The bridal sutures were removed and two drops of Vigamox were placed on the eye. An eye shield was placed to protect the eye and the patient was discharged to the recovery area in good condition.   Addylynn Balin MD

## 2015-12-19 NOTE — Anesthesia Procedure Notes (Signed)
Procedure Name: MAC Date/Time: 12/19/2015 8:40 AM Performed by: Allean Found Pre-anesthesia Checklist: Patient identified, Emergency Drugs available, Suction available, Patient being monitored and Timeout performed Oxygen Delivery Method: Nasal cannula Comments: Spontaneous resp throughout

## 2015-12-19 NOTE — Transfer of Care (Signed)
Immediate Anesthesia Transfer of Care Note  Patient: Nancy Hamilton  Procedure(s) Performed: Procedure(s) with comments: CATARACT EXTRACTION PHACO AND INTRAOCULAR LENS PLACEMENT ([facility]) (Left) - US:1:08.2 AP%:56.7% CDE:28.10 Lot# [id number]  Patient Location: PACU and Short Stay  Anesthesia Type:MAC  Level of Consciousness: awake  Airway & Oxygen Therapy: Patient Spontanous Breathing and Patient connected to nasal cannula oxygen  Post-op Assessment: Report given to RN and Post -op Vital signs reviewed and stable  Post vital signs: Reviewed and stable  Last Vitals:  Filed Vitals:   12/19/15 0718  BP: 129/67  Pulse: 59  Temp: 36.9 C  Resp: 16    Complications: No apparent anesthesia complications

## 2015-12-20 ENCOUNTER — Ambulatory Visit (INDEPENDENT_AMBULATORY_CARE_PROVIDER_SITE_OTHER): Payer: PPO

## 2015-12-20 DIAGNOSIS — E538 Deficiency of other specified B group vitamins: Secondary | ICD-10-CM

## 2015-12-20 MED ORDER — CYANOCOBALAMIN 1000 MCG/ML IJ SOLN
1000.0000 ug | Freq: Once | INTRAMUSCULAR | Status: AC
  Administered 2015-12-20: 1000 ug via INTRAMUSCULAR

## 2015-12-20 NOTE — Progress Notes (Signed)
Patient came in for B12 injection.  Received in Left deltoid.  Patient tolerated.

## 2015-12-27 ENCOUNTER — Telehealth: Payer: Self-pay | Admitting: Internal Medicine

## 2015-12-27 DIAGNOSIS — R059 Cough, unspecified: Secondary | ICD-10-CM

## 2015-12-27 DIAGNOSIS — R05 Cough: Secondary | ICD-10-CM

## 2015-12-27 NOTE — Telephone Encounter (Signed)
Patient called stating that she still has acid reflux with coughing. Her friends have told her that she needs to see a pulmonologist and wants to know Dr. Lupita Dawn thoughts on that. Please advise.

## 2015-12-27 NOTE — Telephone Encounter (Signed)
Left a message to return a call, need clarification on symptoms.

## 2016-01-05 DIAGNOSIS — D2262 Melanocytic nevi of left upper limb, including shoulder: Secondary | ICD-10-CM | POA: Diagnosis not present

## 2016-01-05 DIAGNOSIS — L570 Actinic keratosis: Secondary | ICD-10-CM | POA: Diagnosis not present

## 2016-01-05 DIAGNOSIS — X32XXXA Exposure to sunlight, initial encounter: Secondary | ICD-10-CM | POA: Diagnosis not present

## 2016-01-05 DIAGNOSIS — D225 Melanocytic nevi of trunk: Secondary | ICD-10-CM | POA: Diagnosis not present

## 2016-01-05 DIAGNOSIS — D2271 Melanocytic nevi of right lower limb, including hip: Secondary | ICD-10-CM | POA: Diagnosis not present

## 2016-01-05 DIAGNOSIS — Z85828 Personal history of other malignant neoplasm of skin: Secondary | ICD-10-CM | POA: Diagnosis not present

## 2016-01-06 ENCOUNTER — Telehealth: Payer: Self-pay | Admitting: *Deleted

## 2016-01-06 NOTE — Telephone Encounter (Signed)
Left a VM for her to return my call.

## 2016-01-06 NOTE — Telephone Encounter (Signed)
I would prefer to see her before making a referral to a pulmonologist for a problem  that I have not evaluated  Or seen  since April 2016. I have ordered a chest x ray,  And you can put her in for a follow up visit next available rather than waiting for her annual exam in April

## 2016-01-06 NOTE — Telephone Encounter (Signed)
Spoke with the patient she is having a deep chronic cough.  She is taking Omperazole 40mg  daily.  She is taking that for her Reflux..  Patient is concerned as all her medical friends have told her to follow up with a pulmonary doctor to make sure there is nothing wrong with her lungs.  Patient has a appt scheduled with you on 03/07/16.  She understands if this is not something you think she needs.

## 2016-01-06 NOTE — Telephone Encounter (Signed)
Patient stated that she only has a deep chronic cough. She stated that she follows all directions to medication. She wants to make sure she's not damaging her lungs with the continuous cough.  Please Advise  Pt [phone](cell), [phone]) Can leave a detail message on voicemail

## 2016-01-09 NOTE — Telephone Encounter (Signed)
See prior phone note.  Thanks

## 2016-01-09 NOTE — Telephone Encounter (Signed)
Pt lvm stating she was returning a call. I see Tanya lvm for her Friday.

## 2016-01-09 NOTE — Telephone Encounter (Signed)
Left a second VM as she had tried to return my call. Thanks

## 2016-01-16 ENCOUNTER — Other Ambulatory Visit: Payer: Self-pay

## 2016-01-16 MED ORDER — OMEPRAZOLE 40 MG PO CPDR: 40.0000 mg | DELAYED_RELEASE_CAPSULE | Freq: Every day | ORAL | Status: DC

## 2016-01-16 NOTE — Telephone Encounter (Signed)
Ok to refill,  Refill sent  

## 2016-01-16 NOTE — Telephone Encounter (Signed)
Pt is requesting a refill on omeprezole. Pt's last OV was 03/02/15. Pt has a CPE on 03/07/16. This is a historical med. Please advise if okay to refill, thanks

## 2016-01-17 ENCOUNTER — Ambulatory Visit (INDEPENDENT_AMBULATORY_CARE_PROVIDER_SITE_OTHER): Payer: PPO

## 2016-01-17 DIAGNOSIS — E538 Deficiency of other specified B group vitamins: Secondary | ICD-10-CM | POA: Diagnosis not present

## 2016-01-17 MED ORDER — CYANOCOBALAMIN 1000 MCG/ML IJ SOLN
1000.0000 ug | Freq: Once | INTRAMUSCULAR | Status: AC
  Administered 2016-01-17: 1000 ug via INTRAMUSCULAR

## 2016-01-17 NOTE — Telephone Encounter (Signed)
Spoke with the patient when she came in for nurse visit.  She will get the x ray and then come to her normal physical in April, she doesn't want to change that appt as she states it was hard to get one with her.  i advised that she may need to make another appt to discuss the other issues, she verbalized understanding.

## 2016-01-17 NOTE — Progress Notes (Signed)
Patient came in for a b12 injection.  Received in right deltoid.  Patient tolerated well.

## 2016-01-23 ENCOUNTER — Other Ambulatory Visit: Payer: Self-pay

## 2016-01-23 MED ORDER — FLUOXETINE HCL 20 MG PO CAPS: 20.0000 mg | ORAL_CAPSULE | Freq: Every day | ORAL | Status: DC

## 2016-01-26 ENCOUNTER — Ambulatory Visit
Admission: RE | Admit: 2016-01-26 | Discharge: 2016-01-26 | Disposition: A | Discharge status: Home / Self Care | Payer: PPO | Source: Ambulatory Visit | Attending: Internal Medicine | Admitting: Internal Medicine

## 2016-01-26 DIAGNOSIS — R059 Cough, unspecified: Secondary | ICD-10-CM

## 2016-01-26 DIAGNOSIS — R05 Cough: Secondary | ICD-10-CM | POA: Diagnosis not present

## 2016-01-30 ENCOUNTER — Encounter: Payer: Self-pay | Admitting: *Deleted

## 2016-02-13 ENCOUNTER — Other Ambulatory Visit: Payer: Self-pay

## 2016-02-13 MED ORDER — LEVOCETIRIZINE DIHYDROCHLORIDE 5 MG PO TABS: 5.0000 mg | ORAL_TABLET | Freq: Every evening | ORAL | Status: DC

## 2016-02-21 ENCOUNTER — Ambulatory Visit (INDEPENDENT_AMBULATORY_CARE_PROVIDER_SITE_OTHER): Payer: PPO

## 2016-02-21 DIAGNOSIS — E538 Deficiency of other specified B group vitamins: Secondary | ICD-10-CM

## 2016-02-21 MED ORDER — CYANOCOBALAMIN 1000 MCG/ML IJ SOLN
1000.0000 ug | Freq: Once | INTRAMUSCULAR | Status: AC
  Administered 2016-02-21: 1000 ug via INTRAMUSCULAR

## 2016-02-21 NOTE — Progress Notes (Signed)
Patient came in the office today to receive a B-12 injection. Patient received in her left deltoid. Patient tolerated well.

## 2016-03-07 ENCOUNTER — Ambulatory Visit (INDEPENDENT_AMBULATORY_CARE_PROVIDER_SITE_OTHER): Payer: PPO | Admitting: Internal Medicine

## 2016-03-07 ENCOUNTER — Ambulatory Visit
Admission: RE | Admit: 2016-03-07 | Discharge: 2016-03-07 | Disposition: A | Discharge status: Home / Self Care | Payer: PPO | Source: Ambulatory Visit | Attending: Internal Medicine | Admitting: Internal Medicine

## 2016-03-07 ENCOUNTER — Encounter: Payer: Self-pay | Admitting: Internal Medicine

## 2016-03-07 VITALS — BP 120/60 | HR 66 | Temp 98.0°F | Resp 12 | Ht 62.0 in | Wt 129.5 lb

## 2016-03-07 DIAGNOSIS — Z79899 Other long term (current) drug therapy: Secondary | ICD-10-CM | POA: Diagnosis not present

## 2016-03-07 DIAGNOSIS — M50123 Cervical disc disorder at C6-C7 level with radiculopathy: Secondary | ICD-10-CM | POA: Insufficient documentation

## 2016-03-07 DIAGNOSIS — Z1239 Encounter for other screening for malignant neoplasm of breast: Secondary | ICD-10-CM

## 2016-03-07 DIAGNOSIS — M50122 Cervical disc disorder at C5-C6 level with radiculopathy: Secondary | ICD-10-CM | POA: Diagnosis not present

## 2016-03-07 DIAGNOSIS — M5010 Cervical disc disorder with radiculopathy, unspecified cervical region: Secondary | ICD-10-CM

## 2016-03-07 DIAGNOSIS — M79604 Pain in right leg: Secondary | ICD-10-CM

## 2016-03-07 DIAGNOSIS — K219 Gastro-esophageal reflux disease without esophagitis: Secondary | ICD-10-CM

## 2016-03-07 DIAGNOSIS — M79601 Pain in right arm: Secondary | ICD-10-CM | POA: Diagnosis not present

## 2016-03-07 DIAGNOSIS — M25511 Pain in right shoulder: Secondary | ICD-10-CM

## 2016-03-07 DIAGNOSIS — E785 Hyperlipidemia, unspecified: Secondary | ICD-10-CM

## 2016-03-07 DIAGNOSIS — M25811 Other specified joint disorders, right shoulder: Secondary | ICD-10-CM | POA: Diagnosis not present

## 2016-03-07 DIAGNOSIS — Z0000 Encounter for general adult medical examination without abnormal findings: Secondary | ICD-10-CM

## 2016-03-07 DIAGNOSIS — M47812 Spondylosis without myelopathy or radiculopathy, cervical region: Secondary | ICD-10-CM | POA: Diagnosis not present

## 2016-03-07 DIAGNOSIS — R5383 Other fatigue: Secondary | ICD-10-CM

## 2016-03-07 LAB — COMPREHENSIVE METABOLIC PANEL
ALT: 11 U/L (ref 0–35)
AST: 14 U/L (ref 0–37)
Albumin: 4.1 g/dL (ref 3.5–5.2)
Alkaline Phosphatase: 76 U/L (ref 39–117)
BUN: 12 mg/dL (ref 6–23)
CO2: 30 mEq/L (ref 19–32)
Calcium: 9.5 mg/dL (ref 8.4–10.5)
Chloride: 103 mEq/L (ref 96–112)
Creatinine, Ser: 0.88 mg/dL (ref 0.40–1.20)
GFR: 67.75 mL/min (ref >60.00)
Glucose, Bld: 101 mg/dL — ABNORMAL HIGH (ref 70–99)
Potassium: 4.3 mEq/L (ref 3.5–5.1)
Sodium: 141 mEq/L (ref 135–145)
Total Bilirubin: 0.3 mg/dL (ref 0.2–1.2)
Total Protein: 7 g/dL (ref 6.0–8.3)

## 2016-03-07 LAB — CBC WITH DIFFERENTIAL/PLATELET
Basophils Absolute: 0 K/uL (ref 0.0–0.1)
Basophils Relative: 0.5 % (ref 0.0–3.0)
Eosinophils Absolute: 0.1 K/uL (ref 0.0–0.7)
Eosinophils Relative: 2.2 % (ref 0.0–5.0)
HCT: 39.4 % (ref 36.0–46.0)
Hemoglobin: 13.1 g/dL (ref 12.0–15.0)
Lymphocytes Relative: 28.2 % (ref 12.0–46.0)
Lymphs Abs: 1.6 K/uL (ref 0.7–4.0)
MCHC: 33.2 g/dL (ref 30.0–36.0)
MCV: 86.4 fl (ref 78.0–100.0)
Monocytes Absolute: 0.5 K/uL (ref 0.1–1.0)
Monocytes Relative: 8.3 % (ref 3.0–12.0)
Neutro Abs: 3.3 K/uL (ref 1.4–7.7)
Neutrophils Relative %: 60.8 % (ref 43.0–77.0)
Platelets: 240 K/uL (ref 150.0–400.0)
RBC: 4.55 Mil/uL (ref 3.87–5.11)
RDW: 14.4 % (ref 11.5–15.5)
WBC: 5.5 K/uL (ref 4.0–10.5)

## 2016-03-07 LAB — LIPID PANEL
Cholesterol: 239 mg/dL — ABNORMAL HIGH (ref 0–200)
HDL: 69.8 mg/dL (ref >39.00)
LDL Cholesterol: 153 mg/dL — ABNORMAL HIGH (ref 0–99)
NonHDL: 169.26
Total CHOL/HDL Ratio: 3
Triglycerides: 81 mg/dL (ref 0.0–149.0)
VLDL: 16.2 mg/dL (ref 0.0–40.0)

## 2016-03-07 LAB — TSH: TSH: 2.39 u[IU]/mL (ref 0.35–4.50)

## 2016-03-07 MED ORDER — DEXLANSOPRAZOLE 60 MG PO CPDR: 60.0000 mg | DELAYED_RELEASE_CAPSULE | Freq: Every day | ORAL | Status: DC

## 2016-03-07 NOTE — Progress Notes (Signed)
Pre-visit discussion using our clinic review tool. No additional management support is needed unless otherwise documented below in the visit note.  

## 2016-03-07 NOTE — Progress Notes (Signed)
Patient ID: Nancy Hamilton, female    DOB: Jul 07, 1946  Age: 70 y.o. MRN: [id number]  The patient is here for annual Medicare wellness examination and management of other chronic and acute problems.  Last seen April 2016 The risk factors are reflected in the social history.  The roster of all physicians providing medical care to patient - is listed in the Snapshot section of the chart.  Activities of daily living:  The patient is 100% independent in all ADLs: dressing, toileting, feeding as well as independent mobility  Home safety : The patient has smoke detectors in the home. They wear seatbelts.  There are no firearms at home. There is no violence in the home.   There is no risks for hepatitis, STDs or HIV. There is no   history of blood transfusion. They have no travel history to infectious disease endemic areas of the world.  The patient has seen their dentist in the last six month. They have seen their eye doctor in the last year. They admit to slight hearing difficulty with regard to whispered voices and some television programs.  They have deferred audiologic testing in the last year.  They do not  have excessive sun exposure. Discussed the need for sun protection: hats, long sleeves and use of sunscreen if there is significant sun exposure.   Diet: the importance of a healthy diet is discussed. They do have a healthy diet.  The benefits of regular aerobic exercise were discussed. She walks 4 times per week ,  20 minutes.   Depression screen: there are no signs or vegative symptoms of depression- irritability, change in appetite, anhedonia, sadness/tearfullness.  Cognitive assessment: the patient manages all their financial and personal affairs and is actively engaged. They could relate day,date,year and events; recalled 2/3 objects at 3 minutes; performed clock-face test normally.  The following portions of the patient's history were reviewed and updated as appropriate: allergies,  current medications, past family history, past medical history,  past surgical history, past social history  and problem list.  Visual acuity was not assessed per patient preference since she has regular follow up with her ophthalmologist. Hearing and body mass index were assessed and reviewed.   During the course of the visit the patient was educated and counseled about appropriate screening and preventive services including : fall prevention , diabetes screening, nutrition counseling, colorectal cancer screening, and recommended immunizations.    CC: The primary encounter diagnosis was Cervical disc disorder with radiculopathy of cervical region. Diagnoses of Arm pain, lateral, right, Breast cancer screening, Long-term use of high-risk medication, Hyperlipidemia, Other fatigue, Shoulder pain, right, Gastroesophageal reflux disease without esophagitis, and Hyperlipidemia LDL goal <160 were also pertinent to this visit.   1) persistemt cough, occurs throughout the day and night .  Recent diagnostic chest x ray was normal.  History of GERD, still taking prilosec 40 mg .  Takes antihistamine daily but still coughing.  Taking omeprazole 40 mg in am.  EGD 2014 reflux seen.  Eats chocolate daily.    2) neck pain radiates to both arms.  Lump on right anterior shoulder . Neck pain shoulder pain started last October,  After working at [employer].  She carries heavy buckets.  Felt something "pop" in left shoulder when lifting a vey heavy tray of plants.   Used motrin with temporary relief  Until late November .With pain radiating to both shoulders.   Has pain in right deltoid,   Chronic numbnes and tingling in  both hands,  All 5 fingers,  Has a history of untreated CTS.,  exam c/w Cervical  radiculopathy . Right shoulder pain with internal rotation and forced abdunctin   3) Complicated grief: Taking SSRI every other day . Afraid to stop it, Has been taking it since her husband died last  may    History Nancy Hamilton has a past medical history of Allergy; Anxiety; Depression; Hyperlipidemia; GERD (gastroesophageal reflux disease); Anemia; Cancer ([place]); and Cough.   She has past surgical history that includes Back surgery (1980); Cataract extraction w/PHACO (Right, 11/28/2015); Colonoscopy; and Cataract extraction w/PHACO (Left, 12/19/2015).   Her family history includes Fibromyalgia in her sister; Hyperlipidemia in her sister.She reports that she quit smoking about 23 years ago. She does not have any smokeless tobacco history on file. She reports that she does not drink alcohol or use illicit drugs.  Outpatient Prescriptions Prior to Visit  Medication Sig Dispense Refill  . Calcium Carbonate-Vit D-Min (CALCIUM 1200 PO) Take 1 tablet by mouth daily.    . Cholecalciferol (HM VITAMIN D3) 2000 UNITS CAPS Take 1 capsule by mouth daily.    . Coenzyme Q10 (CO Q 10 PO) Take 200 mg by mouth daily. Reported on 12/19/2015    . cyanocobalamin (,VITAMIN B-12,) 1000 MCG/ML injection Inject 1 ml intrumuscularly daily x 3, then weekly x 4, then monthly thereafter 10 mL 2  . ferrous sulfate 325 (65 FE) MG tablet take 1 tablet by mouth once daily WITH BREAKFAST 90 tablet 1  . FLUoxetine (PROZAC) 20 MG capsule Take 1 capsule (20 mg total) by mouth daily. 30 capsule 0  . levocetirizine (XYZAL) 5 MG tablet Take 1 tablet (5 mg total) by mouth every evening. 30 tablet 1  . Misc Natural Products (OSTEO BI-FLEX JOINT SHIELD PO) Take 1 tablet by mouth daily.    Marland Kitchen omeprazole (PRILOSEC) 40 MG capsule Take 1 capsule (40 mg total) by mouth daily. 39 capsule 1  . RA COL-RITE 100 MG capsule take 1 capsule by mouth twice a day 60 capsule 5  . sodium chloride (OCEAN) 0.65 % nasal spray Place 2 sprays into the nose as needed for congestion.    . Syringe, Disposable, 1 ML MISC For use with B12 injections 25 each 1  . triamcinolone cream (KENALOG) 0.1 % Apply 1 application topically 2 (two) times daily as needed. Reported  on 12/19/2015  0  . meloxicam (MOBIC) 15 MG tablet Take 1 tablet (15 mg total) by mouth daily. (Patient not taking: Reported on 12/19/2015) 30 tablet 3   No facility-administered medications prior to visit.    Review of Systems   Patient denies headache, fevers, malaise, unintentional weight loss, skin rash, eye pain, sinus congestion and sinus pain, sore throat, dysphagia,  hemoptysis , cough, dyspnea, wheezing, chest pain, palpitations, orthopnea, edema, abdominal pain, nausea, melena, diarrhea, constipation, flank pain, dysuria, hematuria, urinary  Frequency, nocturia, seizures,  Focal weakness, Loss of consciousness,  Tremor, insomnia, depression, anxiety, and suicidal ideation.      Objective:  BP 120/60 mmHg  Pulse 66  Temp(Src) 98 F (36.7 C) (Oral)  Resp 12  Ht 5\' 2"  (1.575 m)  Wt 129 lb 8 oz (58.741 kg)  BMI 23.68 kg/m2  SpO2 98%  Physical Exam   General appearance: alert, cooperative and appears stated age Head: Normocephalic, without obvious abnormality, atraumatic Eyes: conjunctivae/corneas clear. PERRL, EOM's intact. Fundi benign. Ears: normal TM's and external ear canals both ears Nose: Nares normal. Septum midline. Mucosa normal. No drainage  or sinus tenderness. Throat: lips, mucosa, and tongue normal; teeth and gums normal Neck: no adenopathy, no carotid bruit, no JVD, supple, symmetrical, trachea midline and thyroid not enlarged, symmetric, no tenderness/mass/nodules Lungs: clear to auscultation bilaterally Breasts: normal appearance, no masses or tenderness Heart: regular rate and rhythm, S1, S2 normal, no murmur, click, rub or gallop Abdomen: soft, non-tender; bowel sounds normal; no masses,  no organomegaly Extremities: extremities normal, atraumatic, no cyanosis or edema Pulses: 2+ and symmetric Skin: Skin color, texture, turgor normal. No rashes or lesions Neurologic: Alert and oriented X 3, normal strength and tone. Normal symmetric reflexes. Normal  coordination and gait.  Msk: pain elicited with force abduction of both arms.     Assessment & Plan:   Problem List Items Addressed This Visit    GERD (gastroesophageal reflux disease)    Managed with prilosec.  Persistent cough reported .  Trial of Dexilant.  Consider EE as cause.       Relevant Medications   dexlansoprazole (DEXILANT) 60 MG capsule   Hyperlipidemia LDL goal <160    Mild, untreated due to statin intolerance .    Lab Results  Component Value Date   CHOL 239* 03/07/2016   HDL 69.80 03/07/2016   LDLCALC 153* 03/07/2016   TRIG 81.0 03/07/2016   CHOLHDL 3 03/07/2016           Cervical disc disorder with radiculopathy of cervical region - Primary    Suggested by history , exam and plain films.  MRI ordered.       Relevant Orders   DG Cervical Spine Complete (Completed)   Shoulder pain, right    Secondary to DJD shoulder with bone cysts  Noted.  Referral  to Orthopedics       Other Visit Diagnoses    Breast cancer screening        Relevant Orders    MM DIGITAL SCREENING BILATERAL    Long-term use of high-risk medication        Relevant Orders    Comprehensive metabolic panel (Completed)    Hyperlipidemia        Relevant Orders    Lipid panel (Completed)    Other fatigue        Relevant Orders    TSH (Completed)    CBC with Differential/Platelet (Completed)       I have discontinued Ms. Claassen's meloxicam. I am also having her start on dexlansoprazole. Additionally, I am having her maintain her Coenzyme Q10 (CO Q 10 PO), sodium chloride, Cholecalciferol, Misc Natural Products (OSTEO BI-FLEX JOINT SHIELD PO), cyanocobalamin, Syringe (Disposable), triamcinolone cream, Calcium Carbonate-Vit D-Min (CALCIUM 1200 PO), ferrous sulfate, RA COL-RITE, omeprazole, FLUoxetine, and levocetirizine.  Meds ordered this encounter  Medications  . dexlansoprazole (DEXILANT) 60 MG capsule    Sig: Take 1 capsule (60 mg total) by mouth daily.    Dispense:  30  capsule    Refill:  2    Medications Discontinued During This Encounter  Medication Reason  . meloxicam (MOBIC) 15 MG tablet Patient Preference    Follow-up: No Follow-up on file.   Crecencio Mc, MD

## 2016-03-07 NOTE — Patient Instructions (Addendum)
For your reflux:    Trial of Dexilant instead of omeprazole.  Take it once daily in the morning   Add 20 mg famotidine (generic Pepcid)  in the afternoon,  before you indulge in chocolate   No eating within 2 hours of lying down !    X rays of right shoulder and cervical spine have been ordered  You need to pay attention to your posture and stop hunching over.  Practice rolling your shoulders frontward and backward.    If you would like your shoulder injected,  You will need 2 days of rest to allow the steroids to work Menopause is a normal process in which your reproductive ability comes to an end. This process happens gradually over a span of months to years, usually between the ages of 3 and 73. Menopause is complete when you have missed 12 consecutive menstrual periods. It is important to talk with your health care provider about some of the most common conditions that affect postmenopausal women, such as heart disease, cancer, and bone loss (osteoporosis). Adopting a healthy lifestyle and getting preventive care can help to promote your health and wellness. Those actions can also lower your chances of developing some of these common conditions. WHAT SHOULD I KNOW ABOUT MENOPAUSE? During menopause, you may experience a number of symptoms, such as:  Moderate-to-severe hot flashes.  Night sweats.  Decrease in sex drive.  Mood swings.  Headaches.  Tiredness.  Irritability.  Memory problems.  Insomnia. Choosing to treat or not to treat menopausal changes is an individual decision that you make with your health care provider. WHAT SHOULD I KNOW ABOUT HORMONE REPLACEMENT THERAPY AND SUPPLEMENTS? Hormone therapy products are effective for treating symptoms that are associated with menopause, such as hot flashes and night sweats. Hormone replacement carries certain risks, especially as you become older. If you are thinking about using estrogen or estrogen with progestin  treatments, discuss the benefits and risks with your health care provider. WHAT SHOULD I KNOW ABOUT HEART DISEASE AND STROKE? Heart disease, heart attack, and stroke become more likely as you age. This may be due, in part, to the hormonal changes that your body experiences during menopause. These can affect how your body processes dietary fats, triglycerides, and cholesterol. Heart attack and stroke are both medical emergencies. There are many things that you can do to help prevent heart disease and stroke:  Have your blood pressure checked at least every 1-2 years. High blood pressure causes heart disease and increases the risk of stroke.  If you are 65-1 years old, ask your health care provider if you should take aspirin to prevent a heart attack or a stroke.  Do not use any tobacco products, including cigarettes, chewing tobacco, or electronic cigarettes. If you need help quitting, ask your health care provider.  It is important to eat a healthy diet and maintain a healthy weight.  Be sure to include plenty of vegetables, fruits, low-fat dairy products, and lean protein.  Avoid eating foods that are high in solid fats, added sugars, or salt (sodium).  Get regular exercise. This is one of the most important things that you can do for your health.  Try to exercise for at least 150 minutes each week. The type of exercise that you do should increase your heart rate and make you sweat. This is known as moderate-intensity exercise.  Try to do strengthening exercises at least twice each week. Do these in addition to the moderate-intensity exercise.  Know  your numbers.Ask your health care provider to check your cholesterol and your blood glucose. Continue to have your blood tested as directed by your health care provider. WHAT SHOULD I KNOW ABOUT CANCER SCREENING? There are several types of cancer. Take the following steps to reduce your risk and to catch any cancer development as early as  possible. Breast Cancer  Practice breast self-awareness.  This means understanding how your breasts normally appear and feel.  It also means doing regular breast self-exams. Let your health care provider know about any changes, no matter how small.  If you are 40 or older, have a clinician do a breast exam (clinical breast exam or CBE) every year. Depending on your age, family history, and medical history, it may be recommended that you also have a yearly breast X-ray (mammogram).  If you have a family history of breast cancer, talk with your health care provider about genetic screening.  If you are at high risk for breast cancer, talk with your health care provider about having an MRI and a mammogram every year.  Breast cancer (BRCA) gene test is recommended for women who have family members with BRCA-related cancers. Results of the assessment will determine the need for genetic counseling and BRCA1 and for BRCA2 testing. BRCA-related cancers include these types:  Breast. This occurs in males or females.  Ovarian.  Tubal. This may also be called fallopian tube cancer.  Cancer of the abdominal or pelvic lining (peritoneal cancer).  Prostate.  Pancreatic. Cervical, Uterine, and Ovarian Cancer Your health care provider may recommend that you be screened regularly for cancer of the pelvic organs. These include your ovaries, uterus, and vagina. This screening involves a pelvic exam, which includes checking for microscopic changes to the surface of your cervix (Pap test).  For women ages 21-65, health care providers may recommend a pelvic exam and a Pap test every three years. For women ages 48-65, they may recommend the Pap test and pelvic exam, combined with testing for human papilloma virus (HPV), every five years. Some types of HPV increase your risk of cervical cancer. Testing for HPV may also be done on women of any age who have unclear Pap test results.  Other health care providers  may not recommend any screening for nonpregnant women who are considered low risk for pelvic cancer and have no symptoms. Ask your health care provider if a screening pelvic exam is right for you.  If you have had past treatment for cervical cancer or a condition that could lead to cancer, you need Pap tests and screening for cancer for at least 20 years after your treatment. If Pap tests have been discontinued for you, your risk factors (such as having a new sexual partner) need to be reassessed to determine if you should start having screenings again. Some women have medical problems that increase the chance of getting cervical cancer. In these cases, your health care provider may recommend that you have screening and Pap tests more often.  If you have a family history of uterine cancer or ovarian cancer, talk with your health care provider about genetic screening.  If you have vaginal bleeding after reaching menopause, tell your health care provider.  There are currently no reliable tests available to screen for ovarian cancer. Lung Cancer Lung cancer screening is recommended for adults 74-35 years old who are at high risk for lung cancer because of a history of smoking. A yearly low-dose CT scan of the lungs is recommended if  you:  Currently smoke.  Have a history of at least 30 pack-years of smoking and you currently smoke or have quit within the past 15 years. A pack-year is smoking an average of one pack of cigarettes per day for one year. Yearly screening should:  Continue until it has been 15 years since you quit.  Stop if you develop a health problem that would prevent you from having lung cancer treatment. Colorectal Cancer  This type of cancer can be detected and can often be prevented.  Routine colorectal cancer screening usually begins at age 82 and continues through age 53.  If you have risk factors for colon cancer, your health care provider may recommend that you be  screened at an earlier age.  If you have a family history of colorectal cancer, talk with your health care provider about genetic screening.  Your health care provider may also recommend using home test kits to check for hidden blood in your stool.  A small camera at the end of a tube can be used to examine your colon directly (sigmoidoscopy or colonoscopy). This is done to check for the earliest forms of colorectal cancer.  Direct examination of the colon should be repeated every 5-10 years until age 61. However, if early forms of precancerous polyps or small growths are found or if you have a family history or genetic risk for colorectal cancer, you may need to be screened more often. Skin Cancer  Check your skin from head to toe regularly.  Monitor any moles. Be sure to tell your health care provider:  About any new moles or changes in moles, especially if there is a change in a mole's shape or color.  If you have a mole that is larger than the size of a pencil eraser.  If any of your family members has a history of skin cancer, especially at a young age, talk with your health care provider about genetic screening.  Always use sunscreen. Apply sunscreen liberally and repeatedly throughout the day.  Whenever you are outside, protect yourself by wearing long sleeves, pants, a wide-brimmed hat, and sunglasses. WHAT SHOULD I KNOW ABOUT OSTEOPOROSIS? Osteoporosis is a condition in which bone destruction happens more quickly than new bone creation. After menopause, you may be at an increased risk for osteoporosis. To help prevent osteoporosis or the bone fractures that can happen because of osteoporosis, the following is recommended:  If you are 103-78 years old, get at least 1,000 mg of calcium and at least 600 mg of vitamin D per day.  If you are older than age 82 but younger than age 31, get at least 1,200 mg of calcium and at least 600 mg of vitamin D per day.  If you are older than  age 48, get at least 1,200 mg of calcium and at least 800 mg of vitamin D per day. Smoking and excessive alcohol intake increase the risk of osteoporosis. Eat foods that are rich in calcium and vitamin D, and do weight-bearing exercises several times each week as directed by your health care provider. WHAT SHOULD I KNOW ABOUT HOW MENOPAUSE AFFECTS [place]? Depression may occur at any age, but it is more common as you become older. Common symptoms of depression include:  Low or sad mood.  Changes in sleep patterns.  Changes in appetite or eating patterns.  Feeling an overall lack of motivation or enjoyment of activities that you previously enjoyed.  Frequent crying spells. Talk with your health  care provider if you think that you are experiencing depression. WHAT SHOULD I KNOW ABOUT IMMUNIZATIONS? It is important that you get and maintain your immunizations. These include:  Tetanus, diphtheria, and pertussis (Tdap) booster vaccine.  Influenza every year before the flu season begins.  Pneumonia vaccine.  Shingles vaccine. Your health care provider may also recommend other immunizations.   This information is not intended to replace advice given to you by your health care provider. Make sure you discuss any questions you have with your health care provider.   Document Released: 12/21/2005 Document Revised: 11/19/2014 Document Reviewed: 07/01/2014 Elsevier Interactive Patient Education [employer].

## 2016-03-09 ENCOUNTER — Other Ambulatory Visit: Payer: Self-pay | Admitting: Internal Medicine

## 2016-03-09 DIAGNOSIS — M5010 Cervical disc disorder with radiculopathy, unspecified cervical region: Secondary | ICD-10-CM | POA: Insufficient documentation

## 2016-03-09 DIAGNOSIS — M25511 Pain in right shoulder: Secondary | ICD-10-CM

## 2016-03-09 NOTE — Assessment & Plan Note (Signed)
Managed with prilosec.  Persistent cough reported .  Trial of Dexilant.  Consider EE as cause.

## 2016-03-09 NOTE — Assessment & Plan Note (Signed)
Secondary to DJD shoulder with bone cysts  Noted.  Referral  to Orthopedics

## 2016-03-09 NOTE — Assessment & Plan Note (Signed)
Suggested by history , exam and plain films.  MRI ordered.

## 2016-03-09 NOTE — Assessment & Plan Note (Signed)
Mild, untreated due to statin intolerance .    Lab Results  Component Value Date   CHOL 239* 03/07/2016   HDL 69.80 03/07/2016   LDLCALC 153* 03/07/2016   TRIG 81.0 03/07/2016   CHOLHDL 3 03/07/2016

## 2016-03-12 ENCOUNTER — Encounter: Payer: Self-pay | Admitting: *Deleted

## 2016-03-14 ENCOUNTER — Encounter: Payer: Self-pay | Admitting: Internal Medicine

## 2016-03-15 DIAGNOSIS — J36 Peritonsillar abscess: Secondary | ICD-10-CM | POA: Diagnosis not present

## 2016-03-15 DIAGNOSIS — R070 Pain in throat: Secondary | ICD-10-CM | POA: Diagnosis not present

## 2016-03-20 ENCOUNTER — Other Ambulatory Visit: Payer: Self-pay

## 2016-03-20 MED ORDER — FLUOXETINE HCL 20 MG PO CAPS: 20.0000 mg | ORAL_CAPSULE | Freq: Every day | ORAL | Status: DC

## 2016-03-21 ENCOUNTER — Telehealth: Payer: Self-pay | Admitting: Internal Medicine

## 2016-03-21 MED ORDER — DEXLANSOPRAZOLE 60 MG PO CPDR: 60.0000 mg | DELAYED_RELEASE_CAPSULE | Freq: Every day | ORAL | Status: DC

## 2016-03-21 NOTE — Telephone Encounter (Signed)
I do not see anything about changing this medication. Do you anything about this?

## 2016-03-21 NOTE — Telephone Encounter (Signed)
I have not heard anything about needing a prior approval so medication is dexilant, called patient and checked medication was put in as sample and never ordered. I ordered and notified patient script sent.

## 2016-03-21 NOTE — Telephone Encounter (Signed)
Pt stated that Dr. Derrel Nip was going to try to start her on a new medication for her heart burn. She has not heard anything about it so she does not know if her insurance denied it or not. She states that she is about out of her omeprazole and would like this refilled if she can not get her new medication.

## 2016-03-23 ENCOUNTER — Telehealth: Payer: Self-pay | Admitting: *Deleted

## 2016-03-23 MED ORDER — ESOMEPRAZOLE MAGNESIUM 40 MG PO PACK: 40.0000 mg | PACK | Freq: Every day | ORAL | Status: DC

## 2016-03-23 NOTE — Addendum Note (Signed)
Addended by: Crecencio Mc on: 03/23/2016 12:10 PM   Modules accepted: Orders, Medications

## 2016-03-23 NOTE — Telephone Encounter (Signed)
Generic nexium sent 90 day supply so she can take it once or twice daily

## 2016-03-23 NOTE — Telephone Encounter (Signed)
Please advise for a change in drug therapy, patient was on Dexilant and omeprazole. Thanks

## 2016-03-23 NOTE — Telephone Encounter (Signed)
[place] stated that patient would like to have the Rx for the generic of Nexium  Pharmacy [facility] on [address]

## 2016-03-26 ENCOUNTER — Ambulatory Visit
Admission: RE | Admit: 2016-03-26 | Discharge: 2016-03-26 | Disposition: A | Discharge status: Home / Self Care | Payer: PPO | Source: Ambulatory Visit | Attending: Internal Medicine | Admitting: Internal Medicine

## 2016-03-26 DIAGNOSIS — M2578 Osteophyte, vertebrae: Secondary | ICD-10-CM | POA: Diagnosis not present

## 2016-03-26 DIAGNOSIS — M50223 Other cervical disc displacement at C6-C7 level: Secondary | ICD-10-CM | POA: Diagnosis not present

## 2016-03-26 DIAGNOSIS — M479 Spondylosis, unspecified: Secondary | ICD-10-CM | POA: Diagnosis not present

## 2016-03-26 DIAGNOSIS — M5010 Cervical disc disorder with radiculopathy, unspecified cervical region: Secondary | ICD-10-CM | POA: Insufficient documentation

## 2016-03-26 DIAGNOSIS — M542 Cervicalgia: Secondary | ICD-10-CM | POA: Diagnosis not present

## 2016-03-27 ENCOUNTER — Ambulatory Visit (INDEPENDENT_AMBULATORY_CARE_PROVIDER_SITE_OTHER): Payer: PPO

## 2016-03-27 DIAGNOSIS — E538 Deficiency of other specified B group vitamins: Secondary | ICD-10-CM

## 2016-03-27 MED ORDER — CYANOCOBALAMIN 1000 MCG/ML IJ SOLN
1000.0000 ug | Freq: Once | INTRAMUSCULAR | Status: AC
  Administered 2016-03-27: 1000 ug via INTRAMUSCULAR

## 2016-03-27 NOTE — Progress Notes (Signed)
Patient came in for B12 injection.  Received in Left deltoid.  Patient tolerated well.  

## 2016-03-30 ENCOUNTER — Other Ambulatory Visit: Payer: Self-pay | Admitting: Internal Medicine

## 2016-03-30 DIAGNOSIS — M47812 Spondylosis without myelopathy or radiculopathy, cervical region: Secondary | ICD-10-CM

## 2016-03-30 MED ORDER — PANTOPRAZOLE SODIUM 40 MG PO TBEC: 40.0000 mg | DELAYED_RELEASE_TABLET | Freq: Every day | ORAL | Status: DC

## 2016-04-12 DIAGNOSIS — R030 Elevated blood-pressure reading, without diagnosis of hypertension: Secondary | ICD-10-CM | POA: Diagnosis not present

## 2016-04-12 DIAGNOSIS — M4802 Spinal stenosis, cervical region: Secondary | ICD-10-CM | POA: Diagnosis not present

## 2016-04-12 DIAGNOSIS — M542 Cervicalgia: Secondary | ICD-10-CM | POA: Diagnosis not present

## 2016-04-18 ENCOUNTER — Ambulatory Visit
Admission: RE | Admit: 2016-04-18 | Discharge: 2016-04-18 | Disposition: A | Discharge status: Home / Self Care | Payer: PPO | Source: Ambulatory Visit | Attending: Internal Medicine | Admitting: Internal Medicine

## 2016-04-18 DIAGNOSIS — Z1239 Encounter for other screening for malignant neoplasm of breast: Secondary | ICD-10-CM

## 2016-04-19 ENCOUNTER — Other Ambulatory Visit: Payer: Self-pay | Admitting: *Deleted

## 2016-04-19 MED ORDER — LEVOCETIRIZINE DIHYDROCHLORIDE 5 MG PO TABS: 5.0000 mg | ORAL_TABLET | Freq: Every evening | ORAL | Status: DC

## 2016-04-24 ENCOUNTER — Other Ambulatory Visit: Payer: Self-pay | Admitting: Internal Medicine

## 2016-04-24 ENCOUNTER — Ambulatory Visit
Admission: RE | Admit: 2016-04-24 | Discharge: 2016-04-24 | Disposition: A | Discharge status: Home / Self Care | Payer: PPO | Source: Ambulatory Visit | Attending: Internal Medicine | Admitting: Internal Medicine

## 2016-04-24 DIAGNOSIS — Z1231 Encounter for screening mammogram for malignant neoplasm of breast: Secondary | ICD-10-CM | POA: Diagnosis not present

## 2016-05-01 DIAGNOSIS — H43811 Vitreous degeneration, right eye: Secondary | ICD-10-CM | POA: Diagnosis not present

## 2016-05-02 ENCOUNTER — Ambulatory Visit (INDEPENDENT_AMBULATORY_CARE_PROVIDER_SITE_OTHER): Payer: PPO | Admitting: *Deleted

## 2016-05-02 DIAGNOSIS — E538 Deficiency of other specified B group vitamins: Secondary | ICD-10-CM | POA: Diagnosis not present

## 2016-05-02 MED ORDER — CYANOCOBALAMIN 1000 MCG/ML IJ SOLN
1000.0000 ug | Freq: Once | INTRAMUSCULAR | Status: AC
  Administered 2016-05-02: 1000 ug via INTRAMUSCULAR

## 2016-06-05 ENCOUNTER — Ambulatory Visit (INDEPENDENT_AMBULATORY_CARE_PROVIDER_SITE_OTHER): Payer: PPO

## 2016-06-05 DIAGNOSIS — E538 Deficiency of other specified B group vitamins: Secondary | ICD-10-CM

## 2016-06-05 MED ORDER — CYANOCOBALAMIN 1000 MCG/ML IJ SOLN
1000.0000 ug | Freq: Once | INTRAMUSCULAR | Status: AC
  Administered 2016-06-05: 1000 ug via INTRAMUSCULAR

## 2016-06-05 NOTE — Progress Notes (Signed)
Patient was in today receiving a B12 injection in the left deltoid. Patient tolerated well. 

## 2016-06-16 ENCOUNTER — Other Ambulatory Visit: Payer: Self-pay | Admitting: Internal Medicine

## 2016-06-21 ENCOUNTER — Other Ambulatory Visit: Payer: Self-pay | Admitting: Internal Medicine

## 2016-07-10 ENCOUNTER — Ambulatory Visit: Payer: PPO

## 2016-07-11 ENCOUNTER — Ambulatory Visit (INDEPENDENT_AMBULATORY_CARE_PROVIDER_SITE_OTHER): Payer: PPO

## 2016-07-11 ENCOUNTER — Encounter (INDEPENDENT_AMBULATORY_CARE_PROVIDER_SITE_OTHER): Payer: Self-pay

## 2016-07-11 DIAGNOSIS — E538 Deficiency of other specified B group vitamins: Secondary | ICD-10-CM

## 2016-07-11 MED ORDER — CYANOCOBALAMIN 1000 MCG/ML IJ SOLN
1000.0000 ug | Freq: Once | INTRAMUSCULAR | Status: AC
  Administered 2016-07-11: 1000 ug via INTRAMUSCULAR

## 2016-07-11 NOTE — Progress Notes (Signed)
Patient came in for b12 injection.  Received in Right deltoid.  Patient tolerated well.  

## 2016-08-14 ENCOUNTER — Ambulatory Visit (INDEPENDENT_AMBULATORY_CARE_PROVIDER_SITE_OTHER): Payer: PPO

## 2016-08-14 DIAGNOSIS — E538 Deficiency of other specified B group vitamins: Secondary | ICD-10-CM | POA: Diagnosis not present

## 2016-08-14 MED ORDER — CYANOCOBALAMIN 1000 MCG/ML IJ SOLN
1000.0000 ug | Freq: Once | INTRAMUSCULAR | Status: AC
  Administered 2016-08-14: 1000 ug via INTRAMUSCULAR

## 2016-08-14 NOTE — Progress Notes (Signed)
Patient came in for b12 injection, received in left deltoid.  Patient tolerated well.  

## 2016-08-15 NOTE — Progress Notes (Signed)
  I have reviewed the above information and agree with above.   Lonzo Saulter, MD 

## 2016-08-26 ENCOUNTER — Other Ambulatory Visit: Payer: Self-pay | Admitting: Internal Medicine

## 2016-09-14 ENCOUNTER — Ambulatory Visit (INDEPENDENT_AMBULATORY_CARE_PROVIDER_SITE_OTHER): Payer: PPO

## 2016-09-14 DIAGNOSIS — E538 Deficiency of other specified B group vitamins: Secondary | ICD-10-CM | POA: Diagnosis not present

## 2016-09-14 DIAGNOSIS — Z23 Encounter for immunization: Secondary | ICD-10-CM | POA: Diagnosis not present

## 2016-09-14 MED ORDER — CYANOCOBALAMIN 1000 MCG/ML IJ SOLN
1000.0000 ug | Freq: Once | INTRAMUSCULAR | Status: AC
  Administered 2016-09-14: 1000 ug via INTRAMUSCULAR

## 2016-09-14 NOTE — Progress Notes (Addendum)
Patient presents for B12 injection.  Injected right deltoid patient tolerated well.      I have reviewed the above information and agree with above.   Deborra Medina, MD

## 2016-09-26 ENCOUNTER — Other Ambulatory Visit: Payer: Self-pay | Admitting: Internal Medicine

## 2016-09-30 ENCOUNTER — Other Ambulatory Visit: Payer: Self-pay | Admitting: Internal Medicine

## 2016-10-16 ENCOUNTER — Ambulatory Visit (INDEPENDENT_AMBULATORY_CARE_PROVIDER_SITE_OTHER): Payer: PPO

## 2016-10-16 DIAGNOSIS — E538 Deficiency of other specified B group vitamins: Secondary | ICD-10-CM | POA: Diagnosis not present

## 2016-10-16 MED ORDER — CYANOCOBALAMIN 1000 MCG/ML IJ SOLN
1000.0000 ug | Freq: Once | INTRAMUSCULAR | Status: AC
  Administered 2016-10-16: 1000 ug via INTRAMUSCULAR

## 2016-10-16 NOTE — Progress Notes (Signed)
  I have reviewed the above information and agree with above.   Carston Riedl, MD 

## 2016-10-16 NOTE — Progress Notes (Signed)
Patient came in for b12 injection.  Received in Left deltoid.  Patient tolerated well  

## 2016-11-01 DIAGNOSIS — Z961 Presence of intraocular lens: Secondary | ICD-10-CM | POA: Diagnosis not present

## 2016-11-13 ENCOUNTER — Telehealth: Payer: Self-pay | Admitting: Internal Medicine

## 2016-11-13 NOTE — Telephone Encounter (Deleted)
Yes but needs to keep appt with Joycelyn Schmid to rule out other causes of vertiog

## 2016-11-13 NOTE — Telephone Encounter (Signed)
Patient advised of below and verbalized an understanding  

## 2016-11-13 NOTE — Telephone Encounter (Signed)
Pt called and stated that she is having inner ear issues. She thinks that she may have vertigo and has been getting sick, and the room is spinning. Pt wants to know if Dr. Derrel Nip would call her in something, scheduled pt with M. Arnett for 11/14/16 @ 7 am. Please advise, thank you!  Call pt @ [phone]

## 2016-11-13 NOTE — Telephone Encounter (Signed)
No , I cannot prescribe anything else  without seeing her,  Because she is already taking meclizine (dramamine)

## 2016-11-13 NOTE — Telephone Encounter (Addendum)
Reason for call: vertigo  Symptoms: vertigo, room is spinning, nausea, vomiting, when she turns head to right head just swims, symptoms worsen when laying down , no cold symptoms ,no fever Duration: since  last Thursday, better today Medications: Dramamine , previously tried Ibuprofen and Tylenol  Last seen for this problem: Seen by: Scheduled for appointment 11/15/15 with Mable Paris Np, FNP , would you be willing to call something in .

## 2016-11-14 ENCOUNTER — Encounter: Payer: Self-pay | Admitting: Family

## 2016-11-14 ENCOUNTER — Ambulatory Visit (INDEPENDENT_AMBULATORY_CARE_PROVIDER_SITE_OTHER): Payer: PPO | Admitting: Family

## 2016-11-14 VITALS — BP 132/76 | HR 66 | Temp 97.4°F | Wt 139.6 lb

## 2016-11-14 DIAGNOSIS — H8111 Benign paroxysmal vertigo, right ear: Secondary | ICD-10-CM

## 2016-11-14 MED ORDER — MECLIZINE HCL 12.5 MG PO TABS: 12.5000 mg | ORAL_TABLET | Freq: Three times a day (TID) | ORAL | 0 refills | Status: DC | PRN

## 2016-11-14 NOTE — Assessment & Plan Note (Signed)
Reassured by normal neurologic exam. Discussed at length with patient the causes of dizziness as well as my working diagnosis of BPPV. 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 Advised to keep ENT appointment next week who will further evaluate etiologies.  Explained that this gesture, with episodic, self-limiting episodes. Trial of meclizine. Advised patient to remain vigilant about triggers for dizziness as related to water intake, food and stress.

## 2016-11-14 NOTE — Progress Notes (Signed)
Subjective:    Patient ID: Nancy Hamilton, female    DOB: 06/25/1946, 71 y.o.   MRN: [id number]  CC: Nancy Hamilton is a 71 y.o. female who presents today for an acute visit.    HPI: CC: vertigo for past 7 days, unchanged. Worst when getting OOB in the morning, worse if got up on the right side. 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 No ear pain/pressure, changes in hearing, vision.  No sinus congestion. Tried dramamine with some relief.   Denies exertional chest pain or pressure, numbness or tingling radiating to left arm or jaw, palpitations, dizziness, frequent headaches, changes in vision, or shortness of breath.   One prior episode of vertigo 3 months ago.   No h/o CVA     HISTORY:  Past Medical History:  Diagnosis Date  . Allergy   . Anemia   . Anxiety   . Cancer ([place])    SKIN  . Cough    CHRONIC FROM REFLUX  . Depression   . GERD (gastroesophageal reflux disease)    SLEEPS WITH HOB ELEVATED  . Hyperlipidemia    Past Surgical History:  Procedure Laterality Date  . BACK SURGERY  1980   Ruptured Disc  . CATARACT EXTRACTION W/PHACO Right 11/28/2015   Procedure: CATARACT EXTRACTION PHACO AND INTRAOCULAR LENS PLACEMENT (IOC);  Surgeon: Estill Cotta, MD;  Location: [facility] ORS;  Service: Ophthalmology;  Laterality: Right;  [place] AP% CDE fluid pack lot # [id number]  . CATARACT EXTRACTION W/PHACO Left 12/19/2015   Procedure: CATARACT EXTRACTION PHACO AND INTRAOCULAR LENS PLACEMENT (IOC);  Surgeon: Estill Cotta, MD;  Location: [facility] ORS;  Service: Ophthalmology;  Laterality: Left;  US:1:08.2 AP%:56.7% CDE:28.10 Lot# [id number]  . COLONOSCOPY     Family History  Problem Relation Age of Onset  . Hyperlipidemia Sister   . Fibromyalgia Sister   . Breast cancer Maternal Aunt 75    Allergies: Tylox [oxycodone-acetaminophen] Current Outpatient Prescriptions on File Prior to Visit   Medication Sig Dispense Refill  . Calcium Carbonate-Vit D-Min (CALCIUM 1200 PO) Take 1 tablet by mouth daily.    . Cholecalciferol (HM VITAMIN D3) 2000 UNITS CAPS Take 1 capsule by mouth daily.    . Coenzyme Q10 (CO Q 10 PO) Take 200 mg by mouth daily. Reported on 12/19/2015    . cyanocobalamin (,VITAMIN B-12,) 1000 MCG/ML injection Inject 1 ml intrumuscularly daily x 3, then weekly x 4, then monthly thereafter 10 mL 2  . ferrous sulfate 325 (65 FE) MG tablet take 1 tablet by mouth once daily WITH BREAKFAST 90 tablet 1  . FLUoxetine (PROZAC) 20 MG capsule Take 1 capsule (20 mg total) by mouth daily. 30 capsule 5  . levocetirizine (XYZAL) 5 MG tablet take 1 tablet by mouth every evening 30 tablet 3  . Misc Natural Products (OSTEO BI-FLEX JOINT SHIELD PO) Take 1 tablet by mouth daily.    . pantoprazole (PROTONIX) 40 MG tablet take 1 tablet by mouth once daily 30 tablet 3  . RA COL-RITE 100 MG capsule take 1 capsule by mouth twice a day 60 capsule 5  . sodium chloride (OCEAN) 0.65 % nasal spray Place 2 sprays into the nose as needed for congestion.    . Syringe, Disposable, 1 ML MISC For use with B12 injections 25 each 1  . triamcinolone cream (KENALOG) 0.1 % Apply 1 application topically 2 (two) times daily as needed. Reported  on 12/19/2015  0  . omeprazole (PRILOSEC) 40 MG capsule Take 1 capsule (40 mg total) by mouth daily. (Patient not taking: Reported on 11/14/2016) 39 capsule 1   No current facility-administered medications on file prior to visit.     Social History  Substance Use Topics  . Smoking status: Former Smoker    Quit date: 08/10/1992  . Smokeless tobacco: Never Used  . Alcohol use No    Review of Systems  Constitutional: Negative for chills and fever.  HENT: Positive for tinnitus. Negative for congestion and sore throat.   Eyes: Negative for visual disturbance.  Respiratory: Negative for cough.   Cardiovascular: Negative for chest pain and palpitations.  Gastrointestinal:  Negative for nausea and vomiting.  Neurological: Negative for dizziness and headaches.  Psychiatric/Behavioral: Negative for confusion.      Objective:    BP 132/76   Pulse 66   Temp 97.4 F (36.3 C) (Oral)   Wt 139 lb 9.6 oz (63.3 kg)   SpO2 95%   BMI 25.53 kg/m    Physical Exam  Constitutional: She appears well-developed and well-nourished.  HENT:  Mouth/Throat: Uvula is midline, oropharynx is clear and moist and mucous membranes are normal.  Eyes: Conjunctivae and EOM are normal. Pupils are equal, round, and reactive to light.  Fundus normal bilaterally.   Cardiovascular: Normal rate, regular rhythm, normal heart sounds and normal pulses.   Pulmonary/Chest: Effort normal and breath sounds normal. She has no wheezes. She has no rhonchi. She has no rales.  Neurological: She is alert. She has normal strength. No cranial nerve deficit or sensory deficit. She displays a negative Romberg sign.  Reflex Scores:      Bicep reflexes are 2+ on the right side and 2+ on the left side.      Patellar reflexes are 2+ on the right side and 2+ on the left side. Grip equal and strong bilateral upper extremities. Gait strong and steady. Able to perform rapid alternating movement without difficulty.  Politely declines [employer] maneuver concerned that it will cause episode before she has to drive home.   Skin: Skin is warm and dry.  Psychiatric: She has a normal mood and affect. Her speech is normal and behavior is normal. Thought content normal.  Vitals reviewed.      Assessment & Plan:   Problem List Items Addressed This Visit      Nervous and Auditory   Benign paroxysmal positional vertigo of right ear - Primary    Reassured by normal neurologic exam. Discussed at length with patient the causes of dizziness as well as my working diagnosis of BPPV. 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 Advised to keep  ENT appointment next week who will further evaluate etiologies.  Explained that this gesture, with episodic, self-limiting episodes. Trial of meclizine. Advised patient to remain vigilant about triggers for dizziness as related to water intake, food and stress.         Relevant Medications   meclizine (ANTIVERT) 12.5 MG tablet        I am having Ms. Welp start on meclizine. I am also having her maintain her Coenzyme Q10 (CO Q 10 PO), sodium chloride, Cholecalciferol, Misc Natural Products (OSTEO BI-FLEX JOINT SHIELD PO), cyanocobalamin, Syringe (Disposable), triamcinolone cream, Calcium Carbonate-Vit D-Min (CALCIUM 1200 PO), ferrous sulfate, omeprazole, FLUoxetine, RA COL-RITE, levocetirizine, and pantoprazole.   Meds ordered this encounter  Medications  . meclizine (  ANTIVERT) 12.5 MG tablet    Sig: Take 1 tablet (12.5 mg total) by mouth 3 (three) times daily as needed for dizziness.    Dispense:  30 tablet    Refill:  0    Order Specific Question:   Supervising Provider    Answer:   Crecencio Mc [[id number]]    Return precautions given.   Risks, benefits, and alternatives of the medications and treatment plan prescribed today were discussed, and patient expressed understanding.   Education regarding symptom management and diagnosis given to patient on AVS.  Continue to follow with TULLO, Aris Everts, MD for routine health maintenance.   Nancy Hamilton and I agreed with plan.   Mable Paris, FNP

## 2016-11-14 NOTE — Patient Instructions (Addendum)
At this point, I suspect Benign paroxysmal positional vertigo (BPPV) and have included information from [facility] below.   You may look videos for Epley's maneuvers a- see additional print outs.   Please try meclizine. No driving while having symptoms.   Keep ENT appointment  If there is no improvement in your symptoms, or if there is any worsening of symptoms, or if you have any additional concerns, please return to this clinic for re-evaluation; or, if we are closed, consider going to the Emergency Room for evaluation.   What is BPPV?  BPPV  is one of the most common causes of vertigo - the sudden sensation that you're spinning or that the inside of your head is spinning. Benign paroxysmal positional vertigo causes brief episodes of mild to intense dizziness. Benign paroxysmal positional vertigo is usually triggered by specific changes in the position of your head. This might occur when you tip your head up or down, when you lie down, or when you turn over or sit up in bed. Although benign paroxysmal positional vertigo can be a bothersome problem, it's rarely serious except when it increases the chance of falls.  If you experience dizziness associated with benign paroxysmal positional vertigo (BPPV), consider these tips: Be aware of the possibility of losing your balance, which can lead to falling and serious injury.  Sit down immediately when you feel dizzy.  Use good lighting if you get up at night.  Walk with a cane for stability if you're at risk of falling.  Work closely with your doctor to manage your symptoms effectively. BPPV may recur even after successful therapy. Fortunately, although there's no cure, the condition can be managed with physical therapy and home treatments.   Dizziness [Uncertain Cause] Dizziness is a common symptom sometimes described as "lightheadedness" or feeling like you are going to faint. If it lasts for only a few seconds and is related to changes in  position (such as getting up after lying or sitting for a long time), it is usually not a sign of anything serious. Dizziness that lasts for minutes to hours, or comes on for no apparent reason, may be a sign of a more serious problem (such as dehydration, a medicine reaction, disease of the heart or brain). Today's exam did not show an exact cause for your dizzy spell . Sometimes additional tests are required before a cause can be found. Therefore, it is important to follow up with your doctor if your symptoms continue. Home Care: 1) If a dizzy spell occurs and lasts more than a few seconds, lie down until it passes. If you are lying down, then you cannot hurt yourself by falling if you do faint. 2) Do not drive or operate dangerous equipment until the dizzy spells have stopped for at least 48 hours. 3) If dizzy spells occur with sudden standing, this may be a sign of mild dehydration. Drink extra fluids over the next few days. 4) If you recently started a new medicine or if you had the dose of a current medicine increased (especially blood pressure medicine), talk with the prescribing doctor about your symptoms. Dose adjustments may be needed. Follow Up with your doctor for further evaluation within the next seven days, if your symptoms continue. Get Prompt Medical Attention if any of the following occur: -- Worsening of your symptoms -- Fainting, headache or seizure -- Repeated vomiting -- Feeling like you or the room is spinning -- Chest, arm, neck, back or jaw pain --  Palpitations (the sense that your heart is fluttering or beating fast or hard) -- Shortness of breath -- Blood in vomit or stool (black or red color) -- Weakness of an arm or leg or one side of the face -- Difficulty with speech or vision  2000-2015 [employer] [address], [place] [address]. All rights reserved. This information is not intended as a substitute for professional medical care. Always  follow your healthcare professional's instructions.

## 2016-11-14 NOTE — Progress Notes (Signed)
Pre visit review using our clinic review tool, if applicable. No additional management support is needed unless otherwise documented below in the visit note. 

## 2016-11-19 DIAGNOSIS — H8111 Benign paroxysmal vertigo, right ear: Secondary | ICD-10-CM | POA: Diagnosis not present

## 2016-11-19 DIAGNOSIS — R42 Dizziness and giddiness: Secondary | ICD-10-CM | POA: Diagnosis not present

## 2016-11-20 ENCOUNTER — Ambulatory Visit (INDEPENDENT_AMBULATORY_CARE_PROVIDER_SITE_OTHER): Payer: PPO

## 2016-11-20 ENCOUNTER — Ambulatory Visit: Payer: PPO

## 2016-11-20 DIAGNOSIS — E538 Deficiency of other specified B group vitamins: Secondary | ICD-10-CM | POA: Diagnosis not present

## 2016-11-20 MED ORDER — CYANOCOBALAMIN 1000 MCG/ML IJ SOLN
1000.0000 ug | Freq: Once | INTRAMUSCULAR | Status: AC
  Administered 2016-11-20: 1000 ug via INTRAMUSCULAR

## 2016-11-20 NOTE — Progress Notes (Addendum)
Patient comes in for B 12 injection.  Injected right deltoid.  Patient tolerated injection well.   Reviewed.  Dr Scott 

## 2016-11-23 DIAGNOSIS — H8111 Benign paroxysmal vertigo, right ear: Secondary | ICD-10-CM | POA: Diagnosis not present

## 2016-12-17 ENCOUNTER — Other Ambulatory Visit: Payer: Self-pay | Admitting: Internal Medicine

## 2016-12-25 ENCOUNTER — Ambulatory Visit (INDEPENDENT_AMBULATORY_CARE_PROVIDER_SITE_OTHER): Payer: PPO

## 2016-12-25 DIAGNOSIS — E538 Deficiency of other specified B group vitamins: Secondary | ICD-10-CM

## 2016-12-25 MED ORDER — CYANOCOBALAMIN 1000 MCG/ML IJ SOLN
1000.0000 ug | Freq: Once | INTRAMUSCULAR | Status: AC
  Administered 2016-12-25: 1000 ug via INTRAMUSCULAR

## 2016-12-25 NOTE — Progress Notes (Addendum)
Patient comes in for B 12 injection.  Injection left deltoid.  Patient tolerated injection well.     Reviewed.  Dr Scott 

## 2016-12-31 ENCOUNTER — Other Ambulatory Visit: Payer: Self-pay | Admitting: Internal Medicine

## 2017-01-04 DIAGNOSIS — D225 Melanocytic nevi of trunk: Secondary | ICD-10-CM | POA: Diagnosis not present

## 2017-01-04 DIAGNOSIS — L570 Actinic keratosis: Secondary | ICD-10-CM | POA: Diagnosis not present

## 2017-01-04 DIAGNOSIS — X32XXXA Exposure to sunlight, initial encounter: Secondary | ICD-10-CM | POA: Diagnosis not present

## 2017-01-04 DIAGNOSIS — D2262 Melanocytic nevi of left upper limb, including shoulder: Secondary | ICD-10-CM | POA: Diagnosis not present

## 2017-01-04 DIAGNOSIS — L821 Other seborrheic keratosis: Secondary | ICD-10-CM | POA: Diagnosis not present

## 2017-01-04 DIAGNOSIS — Z85828 Personal history of other malignant neoplasm of skin: Secondary | ICD-10-CM | POA: Diagnosis not present

## 2017-01-04 DIAGNOSIS — D2261 Melanocytic nevi of right upper limb, including shoulder: Secondary | ICD-10-CM | POA: Diagnosis not present

## 2017-01-04 DIAGNOSIS — D485 Neoplasm of uncertain behavior of skin: Secondary | ICD-10-CM | POA: Diagnosis not present

## 2017-01-28 ENCOUNTER — Other Ambulatory Visit: Payer: Self-pay | Admitting: Internal Medicine

## 2017-01-28 NOTE — Telephone Encounter (Signed)
Pt called and stated that she did a trial run of the Nexium and stated that it works much better than what she is taking now. Pt would like a script for Nexium sent over to [place], [place] (now [facility]). Please advise, thank you!  Call pt @ [phone] (cell) or [phone] (home)

## 2017-01-28 NOTE — Telephone Encounter (Signed)
Per the chart patient was taking Protonix but would like to switch to Nexium, please advise, thanks

## 2017-01-29 ENCOUNTER — Ambulatory Visit (INDEPENDENT_AMBULATORY_CARE_PROVIDER_SITE_OTHER): Payer: PPO | Admitting: *Deleted

## 2017-01-29 DIAGNOSIS — E538 Deficiency of other specified B group vitamins: Secondary | ICD-10-CM

## 2017-01-29 MED ORDER — ESOMEPRAZOLE MAGNESIUM 40 MG PO CPDR: 40.0000 mg | DELAYED_RELEASE_CAPSULE | Freq: Every day | ORAL | 5 refills | Status: DC

## 2017-01-29 MED ORDER — CYANOCOBALAMIN 1000 MCG/ML IJ SOLN
1000.0000 ug | Freq: Once | INTRAMUSCULAR | Status: AC
  Administered 2017-01-29: 1000 ug via INTRAMUSCULAR

## 2017-01-29 MED ORDER — ESOMEPRAZOLE MAGNESIUM 40 MG PO CPDR: 40.0000 mg | DELAYED_RELEASE_CAPSULE | Freq: Every day | ORAL | 3 refills | Status: DC

## 2017-01-29 NOTE — Telephone Encounter (Signed)
Please phone in ,  Could not e mail nexium rx

## 2017-01-29 NOTE — Progress Notes (Addendum)
Patient presented for B 12 injection to right deltoid , patient showed or voiced no words of discomfort.  Reviewed.  Dr Scott 

## 2017-01-29 NOTE — Telephone Encounter (Signed)
Medication has been phoned in 90 day supply as patient requested. To [facility] There is no [facility] any more that's why it could not be escribed.

## 2017-02-05 NOTE — Progress Notes (Signed)
  I have reviewed the above information and agree with above.   Mykenzie Ebanks, MD 

## 2017-03-05 ENCOUNTER — Ambulatory Visit: Payer: PPO

## 2017-03-08 ENCOUNTER — Encounter: Payer: PPO | Admitting: Internal Medicine

## 2017-03-11 ENCOUNTER — Ambulatory Visit (INDEPENDENT_AMBULATORY_CARE_PROVIDER_SITE_OTHER): Payer: PPO | Admitting: Internal Medicine

## 2017-03-11 ENCOUNTER — Encounter: Payer: Self-pay | Admitting: Internal Medicine

## 2017-03-11 DIAGNOSIS — D369 Benign neoplasm, unspecified site: Secondary | ICD-10-CM | POA: Diagnosis not present

## 2017-03-11 DIAGNOSIS — Z1239 Encounter for other screening for malignant neoplasm of breast: Secondary | ICD-10-CM

## 2017-03-11 DIAGNOSIS — E559 Vitamin D deficiency, unspecified: Secondary | ICD-10-CM | POA: Diagnosis not present

## 2017-03-11 DIAGNOSIS — R05 Cough: Secondary | ICD-10-CM

## 2017-03-11 DIAGNOSIS — R5383 Other fatigue: Secondary | ICD-10-CM

## 2017-03-11 DIAGNOSIS — E2839 Other primary ovarian failure: Secondary | ICD-10-CM

## 2017-03-11 DIAGNOSIS — E785 Hyperlipidemia, unspecified: Secondary | ICD-10-CM | POA: Diagnosis not present

## 2017-03-11 DIAGNOSIS — K219 Gastro-esophageal reflux disease without esophagitis: Secondary | ICD-10-CM | POA: Diagnosis not present

## 2017-03-11 DIAGNOSIS — D508 Other iron deficiency anemias: Secondary | ICD-10-CM | POA: Diagnosis not present

## 2017-03-11 DIAGNOSIS — E538 Deficiency of other specified B group vitamins: Secondary | ICD-10-CM

## 2017-03-11 DIAGNOSIS — R059 Cough, unspecified: Secondary | ICD-10-CM

## 2017-03-11 LAB — CBC WITH DIFFERENTIAL/PLATELET
Basophils Absolute: 0 10*3/uL (ref 0.0–0.1)
Basophils Relative: 0.7 % (ref 0.0–3.0)
Eosinophils Absolute: 0.1 10*3/uL (ref 0.0–0.7)
Eosinophils Relative: 3.1 % (ref 0.0–5.0)
HCT: 40 % (ref 36.0–46.0)
Hemoglobin: 13.1 g/dL (ref 12.0–15.0)
Lymphocytes Relative: 31.4 % (ref 12.0–46.0)
Lymphs Abs: 1.5 10*3/uL (ref 0.7–4.0)
MCHC: 32.9 g/dL (ref 30.0–36.0)
MCV: 87.6 fl (ref 78.0–100.0)
Monocytes Absolute: 0.4 10*3/uL (ref 0.1–1.0)
Monocytes Relative: 9.5 % (ref 3.0–12.0)
Neutro Abs: 2.6 10*3/uL (ref 1.4–7.7)
Neutrophils Relative %: 55.3 % (ref 43.0–77.0)
Platelets: 210 10*3/uL (ref 150.0–400.0)
RBC: 4.56 Mil/uL (ref 3.87–5.11)
RDW: 14.4 % (ref 11.5–15.5)
WBC: 4.7 10*3/uL (ref 4.0–10.5)

## 2017-03-11 LAB — IBC PANEL
Iron: 58 ug/dL (ref 42–145)
Saturation Ratios: 13.1 % — ABNORMAL LOW (ref 20.0–50.0)
Transferrin: 316 mg/dL (ref 212.0–360.0)

## 2017-03-11 LAB — FERRITIN: Ferritin: 15.8 ng/mL (ref 10.0–291.0)

## 2017-03-11 LAB — VITAMIN D 25 HYDROXY (VIT D DEFICIENCY, FRACTURES): VITD: 43.85 ng/mL (ref 30.00–100.00)

## 2017-03-11 LAB — COMPREHENSIVE METABOLIC PANEL
ALT: 10 U/L (ref 0–35)
AST: 13 U/L (ref 0–37)
Albumin: 4 g/dL (ref 3.5–5.2)
Alkaline Phosphatase: 79 U/L (ref 39–117)
BUN: 14 mg/dL (ref 6–23)
CO2: 31 mEq/L (ref 19–32)
Calcium: 9.1 mg/dL (ref 8.4–10.5)
Chloride: 105 mEq/L (ref 96–112)
Creatinine, Ser: 0.83 mg/dL (ref 0.40–1.20)
GFR: 72.26 mL/min (ref >60.00)
Glucose, Bld: 86 mg/dL (ref 70–99)
Potassium: 4.4 mEq/L (ref 3.5–5.1)
Sodium: 140 mEq/L (ref 135–145)
Total Bilirubin: 0.4 mg/dL (ref 0.2–1.2)
Total Protein: 6.5 g/dL (ref 6.0–8.3)

## 2017-03-11 LAB — LIPID PANEL
Cholesterol: 239 mg/dL — ABNORMAL HIGH (ref 0–200)
HDL: 73.4 mg/dL (ref >39.00)
LDL Cholesterol: 142 mg/dL — ABNORMAL HIGH (ref 0–99)
NonHDL: 165.59
Total CHOL/HDL Ratio: 3
Triglycerides: 119 mg/dL (ref 0.0–149.0)
VLDL: 23.8 mg/dL (ref 0.0–40.0)

## 2017-03-11 LAB — TSH: TSH: 2.61 u[IU]/mL (ref 0.35–4.50)

## 2017-03-11 MED ORDER — CYANOCOBALAMIN 1000 MCG/ML IJ SOLN
1000.0000 ug | Freq: Once | INTRAMUSCULAR | Status: AC
  Administered 2017-03-11: 1000 ug via INTRAMUSCULAR

## 2017-03-11 NOTE — Assessment & Plan Note (Signed)
Iron deficient,  Takes iron 2-3 week

## 2017-03-11 NOTE — Progress Notes (Signed)
Pre visit review using our clinic review tool, if applicable. No additional management support is needed unless otherwise documented below in the visit note. 

## 2017-03-11 NOTE — Patient Instructions (Addendum)
Continue nexium in the morning   Take the rest of your morning pills  With breakfast .  You can take both stool softeners at bedtime   pulmonary function tests and mammogram ordered  You are due for colonoscopy this year; becauuse your polyps were precancerous: referral  to dr Allen Norris is in progress   Health Maintenance for Postmenopausal Women Menopause is a normal process in which your reproductive ability comes to an end. This process happens gradually over a span of months to years, usually between the ages of 40 and 80. Menopause is complete when you have missed 12 consecutive menstrual periods. It is important to talk with your health care provider about some of the most common conditions that affect postmenopausal women, such as heart disease, cancer, and bone loss (osteoporosis). Adopting a healthy lifestyle and getting preventive care can help to promote your health and wellness. Those actions can also lower your chances of developing some of these common conditions. What should I know about menopause? During menopause, you may experience a number of symptoms, such as:  Moderate-to-severe hot flashes.  Night sweats.  Decrease in sex drive.  Mood swings.  Headaches.  Tiredness.  Irritability.  Memory problems.  Insomnia. Choosing to treat or not to treat menopausal changes is an individual decision that you make with your health care provider. What should I know about hormone replacement therapy and supplements? Hormone therapy products are effective for treating symptoms that are associated with menopause, such as hot flashes and night sweats. Hormone replacement carries certain risks, especially as you become older. If you are thinking about using estrogen or estrogen with progestin treatments, discuss the benefits and risks with your health care provider. What should I know about heart disease and stroke? Heart disease, heart attack, and stroke become more likely as you  age. This may be due, in part, to the hormonal changes that your body experiences during menopause. These can affect how your body processes dietary fats, triglycerides, and cholesterol. Heart attack and stroke are both medical emergencies. There are many things that you can do to help prevent heart disease and stroke:  Have your blood pressure checked at least every 1-2 years. High blood pressure causes heart disease and increases the risk of stroke.  If you are 60-66 years old, ask your health care provider if you should take aspirin to prevent a heart attack or a stroke.  Do not use any tobacco products, including cigarettes, chewing tobacco, or electronic cigarettes. If you need help quitting, ask your health care provider.  It is important to eat a healthy diet and maintain a healthy weight.  Be sure to include plenty of vegetables, fruits, low-fat dairy products, and lean protein.  Avoid eating foods that are high in solid fats, added sugars, or salt (sodium).  Get regular exercise. This is one of the most important things that you can do for your health.  Try to exercise for at least 150 minutes each week. The type of exercise that you do should increase your heart rate and make you sweat. This is known as moderate-intensity exercise.  Try to do strengthening exercises at least twice each week. Do these in addition to the moderate-intensity exercise.  Know your numbers.Ask your health care provider to check your cholesterol and your blood glucose. Continue to have your blood tested as directed by your health care provider. What should I know about cancer screening? There are several types of cancer. Take the following steps to  reduce your risk and to catch any cancer development as early as possible. Breast Cancer  Practice breast self-awareness.  This means understanding how your breasts normally appear and feel.  It also means doing regular breast self-exams. Let your health  care provider know about any changes, no matter how small.  If you are 73 or older, have a clinician do a breast exam (clinical breast exam or CBE) every year. Depending on your age, family history, and medical history, it may be recommended that you also have a yearly breast X-ray (mammogram).  If you have a family history of breast cancer, talk with your health care provider about genetic screening.  If you are at high risk for breast cancer, talk with your health care provider about having an MRI and a mammogram every year.  Breast cancer (BRCA) gene test is recommended for women who have family members with BRCA-related cancers. Results of the assessment will determine the need for genetic counseling and BRCA1 and for BRCA2 testing. BRCA-related cancers include these types:  Breast. This occurs in males or females.  Ovarian.  Tubal. This may also be called fallopian tube cancer.  Cancer of the abdominal or pelvic lining (peritoneal cancer).  Prostate.  Pancreatic. Cervical, Uterine, and Ovarian Cancer  Your health care provider may recommend that you be screened regularly for cancer of the pelvic organs. These include your ovaries, uterus, and vagina. This screening involves a pelvic exam, which includes checking for microscopic changes to the surface of your cervix (Pap test).  For women ages 21-65, health care providers may recommend a pelvic exam and a Pap test every three years. For women ages 70-65, they may recommend the Pap test and pelvic exam, combined with testing for human papilloma virus (HPV), every five years. Some types of HPV increase your risk of cervical cancer. Testing for HPV may also be done on women of any age who have unclear Pap test results.  Other health care providers may not recommend any screening for nonpregnant women who are considered low risk for pelvic cancer and have no symptoms. Ask your health care provider if a screening pelvic exam is right for  you.  If you have had past treatment for cervical cancer or a condition that could lead to cancer, you need Pap tests and screening for cancer for at least 20 years after your treatment. If Pap tests have been discontinued for you, your risk factors (such as having a new sexual partner) need to be reassessed to determine if you should start having screenings again. Some women have medical problems that increase the chance of getting cervical cancer. In these cases, your health care provider may recommend that you have screening and Pap tests more often.  If you have a family history of uterine cancer or ovarian cancer, talk with your health care provider about genetic screening.  If you have vaginal bleeding after reaching menopause, tell your health care provider.  There are currently no reliable tests available to screen for ovarian cancer. Lung Cancer  Lung cancer screening is recommended for adults 13-26 years old who are at high risk for lung cancer because of a history of smoking. A yearly low-dose CT scan of the lungs is recommended if you:  Currently smoke.  Have a history of at least 30 pack-years of smoking and you currently smoke or have quit within the past 15 years. A pack-year is smoking an average of one pack of cigarettes per day for one year.  Yearly screening should:  Continue until it has been 15 years since you quit.  Stop if you develop a health problem that would prevent you from having lung cancer treatment. Colorectal Cancer  This type of cancer can be detected and can often be prevented.  Routine colorectal cancer screening usually begins at age 69 and continues through age 3.  If you have risk factors for colon cancer, your health care provider may recommend that you be screened at an earlier age.  If you have a family history of colorectal cancer, talk with your health care provider about genetic screening.  Your health care provider may also recommend using  home test kits to check for hidden blood in your stool.  A small camera at the end of a tube can be used to examine your colon directly (sigmoidoscopy or colonoscopy). This is done to check for the earliest forms of colorectal cancer.  Direct examination of the colon should be repeated every 5-10 years until age 22. However, if early forms of precancerous polyps or small growths are found or if you have a family history or genetic risk for colorectal cancer, you may need to be screened more often. Skin Cancer  Check your skin from head to toe regularly.  Monitor any moles. Be sure to tell your health care provider:  About any new moles or changes in moles, especially if there is a change in a mole's shape or color.  If you have a mole that is larger than the size of a pencil eraser.  If any of your family members has a history of skin cancer, especially at a young age, talk with your health care provider about genetic screening.  Always use sunscreen. Apply sunscreen liberally and repeatedly throughout the day.  Whenever you are outside, protect yourself by wearing long sleeves, pants, a wide-brimmed hat, and sunglasses. What should I know about osteoporosis? Osteoporosis is a condition in which bone destruction happens more quickly than new bone creation. After menopause, you may be at an increased risk for osteoporosis. To help prevent osteoporosis or the bone fractures that can happen because of osteoporosis, the following is recommended:  If you are 48-75 years old, get at least 1,000 mg of calcium and at least 600 mg of vitamin D per day.  If you are older than age 101 but younger than age 65, get at least 1,200 mg of calcium and at least 600 mg of vitamin D per day.  If you are older than age 42, get at least 1,200 mg of calcium and at least 800 mg of vitamin D per day. Smoking and excessive alcohol intake increase the risk of osteoporosis. Eat foods that are rich in calcium and  vitamin D, and do weight-bearing exercises several times each week as directed by your health care provider. What should I know about how menopause affects my mental health? Depression may occur at any age, but it is more common as you become older. Common symptoms of depression include:  Low or sad mood.  Changes in sleep patterns.  Changes in appetite or eating patterns.  Feeling an overall lack of motivation or enjoyment of activities that you previously enjoyed.  Frequent crying spells. Talk with your health care provider if you think that you are experiencing depression. What should I know about immunizations? It is important that you get and maintain your immunizations. These include:  Tetanus, diphtheria, and pertussis (Tdap) booster vaccine.  Influenza every year before the flu  season begins.  Pneumonia vaccine.  Shingles vaccine. Your health care provider may also recommend other immunizations. This information is not intended to replace advice given to you by your health care provider. Make sure you discuss any questions you have with your health care provider. Document Released: 12/21/2005 Document Revised: 05/18/2016 Document Reviewed: 08/02/2015 Elsevier Interactive Patient Education  2017 [employer].

## 2017-03-11 NOTE — Assessment & Plan Note (Signed)
Screening ordered,  Exam done.

## 2017-03-11 NOTE — Assessment & Plan Note (Signed)
Referral to dr Allen Norris for screening colonoscopy

## 2017-03-11 NOTE — Assessment & Plan Note (Signed)
Noted on prior EGD,  Continue PPI

## 2017-03-11 NOTE — Progress Notes (Signed)
Patient ID: Nancy Hamilton, female    DOB: [birth year]  Age: 71 y.o. MRN: [id number]  The patient is here for follow up and  management of other chronic and acute problems.   requesting PAP smear  Colonoscopy 2013  2 polyps,  5 yr due tubular adenoma.   EGD 2014:  Has a HH and reflux,  Taking nexium in AM,   Along with stool softener and antidepressant, Takes iron  2-3 week,  Very constipating Due for b12 injection mammogram June 2017 DEXA 2014 osteopenia left hip ,  Wants to repeat it  Sees Dr Nancy Hamilton  for sun damage  Last eye exam in December,  Bilateral cataract excision in 2017,  Dingledein    the risk factors are reflected in the social history.  The roster of all physicians providing medical care to patient - is listed in the Snapshot section of the chart.  Activities of daily living:  The patient is 100% independent in all ADLs: dressing, toileting, feeding as well as independent mobility  Home safety : The patient has smoke detectors in the home. They wear seatbelts.  There are no firearms at home. There is no violence in the home.   There is no risks for hepatitis, STDs or HIV. There is no   history of blood transfusion. They have no travel history to infectious disease endemic areas of the world.  The patient has seen their dentist in the last six month. They have seen their eye doctor in the last year. They admit to slight hearing difficulty with regard to whispered voices and some television programs.  They have deferred audiologic testing in the last year.  They do not  have excessive sun exposure. Discussed the need for sun protection: hats, long sleeves and use of sunscreen if there is significant sun exposure.   Diet: the importance of a healthy diet is discussed. They do have a healthy diet.  The benefits of regular aerobic exercise were discussed. She walks 4 times per week ,  20 minutes.   Depression screen: there are no signs or vegative symptoms of depression-  irritability, change in appetite, anhedonia, sadness/tearfullness.  Cognitive assessment: the patient manages all their financial and personal affairs and is actively engaged. They could relate day,date,year and events; recalled 2/3 objects at 3 minutes; performed clock-face test normally.  The following portions of the patient's history were reviewed and updated as appropriate: allergies, current medications, past family history, past medical history,  past surgical history, past social history  and problem list.  Visual acuity was not assessed per patient preference since she has regular follow up with her ophthalmologist. Hearing and body mass index were assessed and reviewed.   During the course of the visit the patient was educated and counseled about appropriate screening and preventive services including : fall prevention , diabetes screening, nutrition counseling, colorectal cancer screening, and recommended immunizations.    CC: Diagnoses of Screening breast examination, Estrogen deficiency, Cough, Breast cancer screening, Hyperlipidemia LDL goal <160, Other iron deficiency anemia, Fatigue, unspecified type, Vitamin D deficiency, B12 deficiency, Gastroesophageal reflux disease without esophagitis, and Adenomatous polyps were pertinent to this visit.   Still coughing. Dry,  All day.,  Has been present for years.  Remote tobacco  Abuse , quit 25 years ago , previously on inhaler but did not work,  Type unclear.  No prior PFTs.   Attributed to reflux,  but Nexium has not been helping as much as with protonix or prilosec  History Nancy Hamilton has a past medical history of Allergy; Anemia; Anxiety; Cancer (Nancy Hamilton); Cough; Depression; GERD (gastroesophageal reflux disease); and Hyperlipidemia.   She has a past surgical history that includes Back surgery (1980); Cataract extraction w/PHACO (Right, 11/28/2015); Colonoscopy; and Cataract extraction w/PHACO (Left, 12/19/2015).   Her family history includes  Breast cancer (age of onset: 37) in her maternal aunt; Fibromyalgia in her sister; Hyperlipidemia in her sister.She reports that she quit smoking about 24 years ago. She has never used smokeless tobacco. She reports that she does not drink alcohol or use drugs.  Outpatient Medications Prior to Visit  Medication Sig Dispense Refill  . Calcium Carbonate-Vit D-Min (CALCIUM 1200 PO) Take 1 tablet by mouth daily.    . Cholecalciferol (HM VITAMIN D3) 2000 UNITS CAPS Take 1 capsule by mouth daily.    . Coenzyme Q10 (CO Q 10 PO) Take 200 mg by mouth daily. Reported on 12/19/2015    . cyanocobalamin (,VITAMIN B-12,) 1000 MCG/ML injection Inject 1 ml intrumuscularly daily x 3, then weekly x 4, then monthly thereafter 10 mL 2  . esomeprazole (NEXIUM) 40 MG capsule Take 1 capsule (40 mg total) by mouth daily. 90 capsule 3  . ferrous sulfate 325 (65 FE) MG tablet take 1 tablet by mouth once daily WITH BREAKFAST 90 tablet 1  . FLUoxetine (PROZAC) 20 MG capsule Take 1 capsule (20 mg total) by mouth daily. 30 capsule 5  . levocetirizine (XYZAL) 5 MG tablet take 1 tablet by mouth every evening 30 tablet 3  . meclizine (ANTIVERT) 12.5 MG tablet Take 1 tablet (12.5 mg total) by mouth 3 (three) times daily as needed for dizziness. 30 tablet 0  . Misc Natural Products (OSTEO BI-FLEX JOINT SHIELD PO) Take 1 tablet by mouth daily.    Marland Kitchen RA COL-RITE 100 MG capsule take 1 capsule by mouth twice a day 60 capsule 3  . sodium chloride (OCEAN) 0.65 % nasal spray Place 2 sprays into the nose as needed for congestion.    . Syringe, Disposable, 1 ML MISC For use with B12 injections 25 each 1  . triamcinolone cream (KENALOG) 0.1 % Apply 1 application topically 2 (two) times daily as needed. Reported on 12/19/2015  0   No facility-administered medications prior to visit.     Review of Systems   Patient denies headache, fevers, malaise, unintentional weight loss, skin rash, eye pain, sinus congestion and sinus pain, sore throat,  dysphagia,  Hemoptysis, dyspnea, wheezing, chest pain, palpitations, orthopnea, edema, abdominal pain, nausea, melena, diarrhea, constipation, flank pain, dysuria, hematuria, urinary  Frequency, nocturia, numbness, tingling, seizures,  Focal weakness, Loss of consciousness,  Tremor, insomnia, depression, anxiety, and suicidal ideation.      Objective:  BP 120/66 (BP Location: Left Arm, Patient Position: Sitting, Cuff Size: Normal)   Pulse (!) 57   Temp 97.8 F (36.6 C) (Oral)   Resp 16   Ht 5\' 2"  (1.575 m)   Wt 137 lb 9.6 oz (62.4 kg)   SpO2 96%   BMI 25.17 kg/m   Physical Exam   General Appearance:    Alert, cooperative, no distress, appears stated age  Head:    Normocephalic, without obvious abnormality, atraumatic  Eyes:    PERRL, conjunctiva/corneas clear, EOM's intact, fundi    benign, both eyes  Ears:    Normal TM's and external ear canals, both ears  Nose:   Nares normal, septum midline, mucosa normal, no drainage    or sinus tenderness  Throat:  Lips, mucosa, and tongue normal; teeth and gums normal  Neck:   Supple, symmetrical, trachea midline, no adenopathy;    thyroid:  no enlargement/tenderness/nodules; no carotid   bruit or JVD  Back:     Symmetric, no curvature, ROM normal, no CVA tenderness  Lungs:     Clear to auscultation bilaterally, respirations unlabored  Chest Wall:    No tenderness or deformity   Heart:    Regular rate and rhythm, S1 and S2 normal, no murmur, rub   or gallop  Breast Exam:    No tenderness, masses, or nipple abnormality  Abdomen:     Soft, non-tender, bowel sounds active all four quadrants,    no masses, no organomegaly  Genitalia:    Pelvic: cervix normal in appearance, external genitalia normal, no adnexal masses or tenderness, no cervical motion tenderness, rectovaginal septum normal, uterus normal size, shape, and consistency and vagina normal without discharge  Extremities:   Extremities normal, atraumatic, no cyanosis or edema   Pulses:   2+ and symmetric all extremities  Skin:   Skin color, texture, turgor normal, no rashes or lesions  Lymph nodes:   Cervical, supraclavicular, and axillary nodes normal  Neurologic:   CNII-XII intact, normal strength, sensation and reflexes    throughout      Assessment & Plan:   Problem List Items Addressed This Visit    Adenomatous polyps    Referral to dr Allen Norris for screening colonoscopy      Relevant Orders   Ambulatory referral to Gastroenterology   Anemia    Iron deficient,  Takes iron 2-3 week       Relevant Medications   cyanocobalamin ((VITAMIN B-12)) injection 1,000 mcg (Completed)   Other Relevant Orders   CBC with Differential/Platelet (Completed)   IBC panel (Completed)   Ferritin (Completed)   B12 deficiency   Relevant Medications   cyanocobalamin ((VITAMIN B-12)) injection 1,000 mcg (Completed)   Breast cancer screening    Screening ordered,  Exam done.       Relevant Orders   MM SCREENING BREAST TOMO BILATERAL   GERD (gastroesophageal reflux disease)    Noted on prior EGD,  Continue PPI      Hyperlipidemia LDL goal <160   Relevant Orders   Lipid panel (Completed)    Other Visit Diagnoses    Screening breast examination       Relevant Orders   MM SCREENING BREAST TOMO BILATERAL   Estrogen deficiency       Relevant Orders   DG Bone Density   Cough       Relevant Orders   Pulmonary Function Test [facility] Only   Pulmonary Function Test [facility] Only   Fatigue, unspecified type       Relevant Orders   Comprehensive metabolic panel (Completed)   TSH (Completed)   Vitamin D deficiency       Relevant Orders   VITAMIN D 25 Hydroxy (Vit-D Deficiency, Fractures) (Completed)     A total of 40 minutes was spent with patient more than half of which was spent in counseling patient on the above mentioned issues , reviewing and explaining recent labs and imaging studies done, and coordination of care.  I am having Ms. Clanton maintain her Coenzyme Q10  (CO Q 10 PO), sodium chloride, Cholecalciferol, Misc Natural Products (OSTEO BI-FLEX JOINT SHIELD PO), cyanocobalamin, Syringe (Disposable), triamcinolone cream, Calcium Carbonate-Vit D-Min (CALCIUM 1200 PO), ferrous sulfate, FLUoxetine, meclizine, RA COL-RITE, levocetirizine, and esomeprazole. We administered cyanocobalamin.  Meds  ordered this encounter  Medications  . cyanocobalamin ((VITAMIN B-12)) injection 1,000 mcg    There are no discontinued medications.  Follow-up: No Follow-up on file.   Crecencio Mc, MD

## 2017-03-14 ENCOUNTER — Encounter: Payer: Self-pay | Admitting: Internal Medicine

## 2017-03-14 ENCOUNTER — Telehealth: Payer: Self-pay | Admitting: *Deleted

## 2017-03-14 ENCOUNTER — Encounter: Payer: Self-pay | Admitting: Gastroenterology

## 2017-03-14 NOTE — Telephone Encounter (Signed)
Patient requested lab results [phone]

## 2017-03-14 NOTE — Telephone Encounter (Signed)
See result note message 

## 2017-04-04 ENCOUNTER — Ambulatory Visit: Payer: PPO | Attending: Internal Medicine

## 2017-04-04 DIAGNOSIS — R05 Cough: Secondary | ICD-10-CM | POA: Diagnosis not present

## 2017-04-04 DIAGNOSIS — J45909 Unspecified asthma, uncomplicated: Secondary | ICD-10-CM | POA: Insufficient documentation

## 2017-04-04 DIAGNOSIS — R059 Cough, unspecified: Secondary | ICD-10-CM

## 2017-04-04 MED ORDER — SODIUM CHLORIDE 0.9 % IN NEBU
3.0000 mL | INHALATION_SOLUTION | Freq: Once | RESPIRATORY_TRACT | Status: AC
  Administered 2017-04-04: 3 mL via RESPIRATORY_TRACT

## 2017-04-04 MED ORDER — METHACHOLINE 4 MG/ML NEB SOLN
2.0000 mL | Freq: Once | RESPIRATORY_TRACT | Status: AC
  Administered 2017-04-04: 8 mg via RESPIRATORY_TRACT
  Filled 2017-04-04: qty 2

## 2017-04-04 MED ORDER — METHACHOLINE 1 MG/ML NEB SOLN
2.0000 mL | Freq: Once | RESPIRATORY_TRACT | Status: AC
  Administered 2017-04-04: 2 mg via RESPIRATORY_TRACT
  Filled 2017-04-04: qty 2

## 2017-04-04 MED ORDER — METHACHOLINE 0.0625 MG/ML NEB SOLN
2.0000 mL | Freq: Once | RESPIRATORY_TRACT | Status: AC
  Administered 2017-04-04: 0.125 mg via RESPIRATORY_TRACT
  Filled 2017-04-04: qty 2

## 2017-04-04 MED ORDER — METHACHOLINE 16 MG/ML NEB SOLN
2.0000 mL | Freq: Once | RESPIRATORY_TRACT | Status: AC
  Administered 2017-04-04: 32 mg via RESPIRATORY_TRACT
  Filled 2017-04-04: qty 2

## 2017-04-04 MED ORDER — ALBUTEROL SULFATE (2.5 MG/3ML) 0.083% IN NEBU
2.5000 mg | INHALATION_SOLUTION | Freq: Once | RESPIRATORY_TRACT | Status: AC
  Administered 2017-04-04: 2.5 mg via RESPIRATORY_TRACT
  Filled 2017-04-04: qty 3

## 2017-04-04 MED ORDER — METHACHOLINE 0.25 MG/ML NEB SOLN
2.0000 mL | Freq: Once | RESPIRATORY_TRACT | Status: AC
  Administered 2017-04-04: 0.5 mg via RESPIRATORY_TRACT
  Filled 2017-04-04: qty 2

## 2017-04-05 ENCOUNTER — Other Ambulatory Visit: Payer: Self-pay | Admitting: Internal Medicine

## 2017-04-05 DIAGNOSIS — J4530 Mild persistent asthma, uncomplicated: Secondary | ICD-10-CM

## 2017-04-05 MED ORDER — MONTELUKAST SODIUM 10 MG PO TABS: 10.0000 mg | ORAL_TABLET | Freq: Every day | ORAL | 3 refills | Status: DC

## 2017-04-09 ENCOUNTER — Ambulatory Visit: Payer: PPO

## 2017-04-09 ENCOUNTER — Telehealth: Payer: Self-pay | Admitting: *Deleted

## 2017-04-09 NOTE — Telephone Encounter (Signed)
Please advise 

## 2017-04-09 NOTE — Telephone Encounter (Signed)
There was no evidence of bronchospasm with the methacholine challenge.

## 2017-04-09 NOTE — Telephone Encounter (Signed)
Patient requested results from broncho challenge  Pt contact  [phone]

## 2017-04-10 NOTE — Telephone Encounter (Signed)
left message to return call to office 

## 2017-04-11 NOTE — Telephone Encounter (Signed)
Patient will keep appointment.

## 2017-04-11 NOTE — Telephone Encounter (Signed)
Yes she should keep the pulmonary appointment

## 2017-04-11 NOTE — Telephone Encounter (Signed)
Do you still want patient to follow up with pulmonary? Patient given results and voiced understanding.

## 2017-04-16 ENCOUNTER — Ambulatory Visit (INDEPENDENT_AMBULATORY_CARE_PROVIDER_SITE_OTHER): Payer: PPO | Admitting: *Deleted

## 2017-04-16 DIAGNOSIS — E538 Deficiency of other specified B group vitamins: Secondary | ICD-10-CM | POA: Diagnosis not present

## 2017-04-16 MED ORDER — CYANOCOBALAMIN 1000 MCG/ML IJ SOLN
1000.0000 ug | Freq: Once | INTRAMUSCULAR | Status: AC
  Administered 2017-04-16: 1000 ug via INTRAMUSCULAR

## 2017-04-16 NOTE — Progress Notes (Addendum)
Patient presented for B 12 injection to right deltoid, patient voiced no concern nor showed any sign of distress during injection.  Reviewed.  Dr Nicki Reaper

## 2017-04-22 ENCOUNTER — Other Ambulatory Visit: Payer: Self-pay | Admitting: Internal Medicine

## 2017-05-06 NOTE — Progress Notes (Signed)
[facility] Consultation      Assessment and Plan:  Allergic rhinitis.  --Start flonase daily.   Chronic cough.  --Continue current measures for sympomatic relief of cough.  --May use lozenges and guaifenesin as needed for cough relief.  --If not improved after flonase, will likely need to consider bronchoscopy as a last step in the workup.  Reflux.  --Continue anti-reflux measures.  --Understands chocolate can contribute to reflux.   Date: 05/06/2017  MRN# [id number] Nancy Hamilton 02/24/1946    Nancy Hamilton is a 71 y.o. old female seen in consultation for chief complaint of:    Chief Complaint  Patient presents with  . Advice Only    referred by Dr. Derrel Nip  . Cough    x 10 years    HPI:   The patient is here for a chronic cough of 10 years duration.  She has seen an allergist several years ago, she was diagnosed with acid reflux and put on protonix which helped.  However she noticed a few years ago, it got worse again a few years ago. She started working in a greenhouse about 8 years ago.  She has no pets home, [place]. She has had allergy testing, which was apparently negative. She has been tried on breo and [employer] which did not help.  She eats 1 snickers per day and notes that it can make her reflux worse. She drinks decaf.  Cough lozenges and guaifenesin (delsym) prn helps with the cough.   She has been taking Xyzal, Singulair and Protonix daily.  I personally reviewed the x-ray films; chest x-ray 04/24/16: Unremarkable Lungs.  I personally reviewed methacholine challenge testing data; greatest change in FEV1 of 9% at maximum dose of methacholine. This is consistent with a negative test.  I personally reviewed PFt tracings: FEV= 105%; ratio=79%; no abnormalities detected.    PMHX:   Past Medical History:  Diagnosis Date  . Allergy   . Anemia   . Anxiety   . Cancer ([place])    SKIN  . Cough    CHRONIC FROM REFLUX  .  Depression   . GERD (gastroesophageal reflux disease)    SLEEPS WITH HOB ELEVATED  . Hyperlipidemia    Surgical Hx:  Past Surgical History:  Procedure Laterality Date  . BACK SURGERY  1980   Ruptured Disc  . CATARACT EXTRACTION W/PHACO Right 11/28/2015   Procedure: CATARACT EXTRACTION PHACO AND INTRAOCULAR LENS PLACEMENT (IOC);  Surgeon: Estill Cotta, MD;  Location: [facility] ORS;  Service: Ophthalmology;  Laterality: Right;  [place] AP% CDE fluid pack lot # [id number]  . CATARACT EXTRACTION W/PHACO Left 12/19/2015   Procedure: CATARACT EXTRACTION PHACO AND INTRAOCULAR LENS PLACEMENT (IOC);  Surgeon: Estill Cotta, MD;  Location: [facility] ORS;  Service: Ophthalmology;  Laterality: Left;  US:1:08.2 AP%:56.7% CDE:28.10 Lot# [id number]  . COLONOSCOPY     Family Hx:  Family History  Problem Relation Age of Onset  . Hyperlipidemia Sister   . Fibromyalgia Sister   . Breast cancer Maternal Aunt 75   Social Hx:   Social History  Substance Use Topics  . Smoking status: Former Smoker    Quit date: 08/10/1992  . Smokeless tobacco: Never Used  . Alcohol use No   Medication:    Current Outpatient Prescriptions:  .  Calcium Carbonate-Vit D-Min (CALCIUM 1200 PO), Take 1 tablet by mouth daily., Disp: , Rfl:  .  Cholecalciferol (HM VITAMIN D3) 2000 UNITS CAPS, Take 1 capsule  by mouth daily., Disp: , Rfl:  .  Coenzyme Q10 (CO Q 10 PO), Take 200 mg by mouth daily. Reported on 12/19/2015, Disp: , Rfl:  .  cyanocobalamin (,VITAMIN B-12,) 1000 MCG/ML injection, Inject 1 ml intrumuscularly daily x 3, then weekly x 4, then monthly thereafter, Disp: 10 mL, Rfl: 2 .  esomeprazole (NEXIUM) 40 MG capsule, Take 1 capsule (40 mg total) by mouth daily., Disp: 90 capsule, Rfl: 3 .  ferrous sulfate 325 (65 FE) MG tablet, take 1 tablet by mouth once daily WITH BREAKFAST, Disp: 90 tablet, Rfl: 1 .  FLUoxetine (PROZAC) 20 MG capsule, Take 1 capsule (20 mg total) by mouth daily., Disp: 30 capsule, Rfl: 5 .   levocetirizine (XYZAL) 5 MG tablet, take 1 tablet by mouth every evening, Disp: 30 tablet, Rfl: 3 .  meclizine (ANTIVERT) 12.5 MG tablet, Take 1 tablet (12.5 mg total) by mouth 3 (three) times daily as needed for dizziness., Disp: 30 tablet, Rfl: 0 .  Misc Natural Products (OSTEO BI-FLEX JOINT SHIELD PO), Take 1 tablet by mouth daily., Disp: , Rfl:  .  montelukast (SINGULAIR) 10 MG tablet, Take 1 tablet (10 mg total) by mouth at bedtime., Disp: 30 tablet, Rfl: 3 .  RA COL-RITE 100 MG capsule, take 1 capsule by mouth twice a day, Disp: 60 capsule, Rfl: 3 .  sodium chloride (OCEAN) 0.65 % nasal spray, Place 2 sprays into the nose as needed for congestion., Disp: , Rfl:  .  Syringe, Disposable, 1 ML MISC, For use with B12 injections, Disp: 25 each, Rfl: 1 .  triamcinolone cream (KENALOG) 0.1 %, Apply 1 application topically 2 (two) times daily as needed. Reported on 12/19/2015, Disp: , Rfl: 0   Allergies:  Tylox [oxycodone-acetaminophen]  Review of Systems: Gen:  Denies  fever, sweats, chills HEENT: Denies blurred vision, double vision. bleeds, sore throat Cvc:  No dizziness, chest pain. Resp:   Denies cough or sputum production, shortness of breath Gi: Denies swallowing difficulty, stomach pain. Gu:  Denies bladder incontinence, burning urine Ext:   No Joint pain, stiffness. Skin: No skin rash,  hives  Endoc:  No polyuria, polydipsia. Psych: No depression, insomnia. Other:  All other systems were reviewed with the patient and were negative other that what is mentioned in the HPI.   Physical Examination:   VS: BP 122/68 (BP Location: Left Arm, Cuff Size: Normal)   Pulse 74   SpO2 97%   General Appearance: No distress  Neuro:without focal findings,  speech normal,  HEENT: PERRLA, EOM intact.  Significant nasal drip.  Pulmonary: normal breath sounds, No wheezing.  CardiovascularNormal S1,S2.  No m/r/g.   Abdomen: Benign, Soft, non-tender. Renal:  No costovertebral tenderness  GU:  No  performed at this time. Endoc: No evident thyromegaly, no signs of acromegaly. Skin:   warm, no rashes, no ecchymosis  Extremities: normal, no cyanosis, clubbing.  Other findings:    LABORATORY PANEL:   CBC No results for input(s): WBC, HGB, HCT, PLT in the last 168 hours. ------------------------------------------------------------------------------------------------------------------  Chemistries  No results for input(s): NA, K, CL, CO2, GLUCOSE, BUN, CREATININE, CALCIUM, MG, AST, ALT, ALKPHOS, BILITOT in the last 168 hours.  Invalid input(s): GFRCGP ------------------------------------------------------------------------------------------------------------------  Cardiac Enzymes No results for input(s): TROPONINI in the last 168 hours. ------------------------------------------------------------  RADIOLOGY:  No results found.     Thank  you for the consultation and for allowing [facility] to assist in the care of your patient. Our recommendations are noted  above.  Please contact us if we can be of further service.   Marda Stalker, MD.  Board Certified in Internal Medicine, Pulmonary Medicine, [place], and Sleep Medicine.  [facility] Office Number: [phone]  Patricia Pesa, M.D.  Merton Border, M.D  05/06/2017

## 2017-05-07 ENCOUNTER — Other Ambulatory Visit: Payer: Self-pay | Admitting: *Deleted

## 2017-05-07 ENCOUNTER — Ambulatory Visit (INDEPENDENT_AMBULATORY_CARE_PROVIDER_SITE_OTHER): Payer: PPO | Admitting: Internal Medicine

## 2017-05-07 ENCOUNTER — Encounter: Payer: Self-pay | Admitting: Internal Medicine

## 2017-05-07 VITALS — BP 122/68 | HR 74

## 2017-05-07 DIAGNOSIS — R059 Cough, unspecified: Secondary | ICD-10-CM

## 2017-05-07 DIAGNOSIS — R05 Cough: Secondary | ICD-10-CM | POA: Diagnosis not present

## 2017-05-07 NOTE — Patient Instructions (Addendum)
--  use flonase 2 sprays each nostril once daily.   --If not improved then will require a bronchoscopy.   --Take her full dose antihistamine every day. You may take this medicine at night.

## 2017-05-08 ENCOUNTER — Telehealth: Payer: Self-pay | Admitting: Internal Medicine

## 2017-05-08 ENCOUNTER — Telehealth: Payer: Self-pay | Admitting: *Deleted

## 2017-05-08 MED ORDER — FLUTICASONE PROPIONATE 50 MCG/ACT NA SUSP: 2.0000 | Freq: Every day | NASAL | 2 refills | Status: DC

## 2017-05-08 NOTE — Telephone Encounter (Signed)
Patient needs 2 mos f/u with Dr. Ashby Dawes around 07/06/17.

## 2017-05-08 NOTE — Telephone Encounter (Signed)
done

## 2017-05-08 NOTE — Telephone Encounter (Signed)
Pt calling asking if we can send in a prescription for Flonase  Over counter is costing her more than she could do   Please Send to [facility] on [address]

## 2017-05-09 ENCOUNTER — Other Ambulatory Visit: Payer: Self-pay | Admitting: Internal Medicine

## 2017-05-20 ENCOUNTER — Telehealth: Payer: Self-pay | Admitting: Internal Medicine

## 2017-05-20 NOTE — Telephone Encounter (Signed)
Left pt message asking to call Ebony Hail back directly at [phone] to schedule AWV. Thanks!  Last AWV 03/07/16

## 2017-05-21 ENCOUNTER — Ambulatory Visit (INDEPENDENT_AMBULATORY_CARE_PROVIDER_SITE_OTHER): Payer: PPO | Admitting: *Deleted

## 2017-05-21 DIAGNOSIS — E538 Deficiency of other specified B group vitamins: Secondary | ICD-10-CM

## 2017-05-21 MED ORDER — CYANOCOBALAMIN 1000 MCG/ML IJ SOLN
1000.0000 ug | Freq: Once | INTRAMUSCULAR | Status: AC
  Administered 2017-05-21: 1000 ug via INTRAMUSCULAR

## 2017-05-21 NOTE — Progress Notes (Addendum)
cyanPatient presented for B 12 injection to left deltoid, patient voiced no concerns nor showed any signs of distress during injection  Reviewed  Dr Scott 

## 2017-05-22 ENCOUNTER — Ambulatory Visit
Admission: RE | Admit: 2017-05-22 | Discharge: 2017-05-22 | Disposition: A | Discharge status: Home / Self Care | Payer: PPO | Source: Ambulatory Visit | Attending: Internal Medicine | Admitting: Internal Medicine

## 2017-05-22 DIAGNOSIS — M85852 Other specified disorders of bone density and structure, left thigh: Secondary | ICD-10-CM | POA: Diagnosis not present

## 2017-05-22 DIAGNOSIS — Z1231 Encounter for screening mammogram for malignant neoplasm of breast: Secondary | ICD-10-CM | POA: Insufficient documentation

## 2017-05-22 DIAGNOSIS — Z1239 Encounter for other screening for malignant neoplasm of breast: Secondary | ICD-10-CM

## 2017-05-22 DIAGNOSIS — E2839 Other primary ovarian failure: Secondary | ICD-10-CM | POA: Insufficient documentation

## 2017-05-22 DIAGNOSIS — M8580 Other specified disorders of bone density and structure, unspecified site: Secondary | ICD-10-CM | POA: Diagnosis not present

## 2017-06-14 ENCOUNTER — Other Ambulatory Visit: Payer: Self-pay | Admitting: Internal Medicine

## 2017-06-21 NOTE — Telephone Encounter (Signed)
Scheduled 06/25/17

## 2017-06-25 ENCOUNTER — Ambulatory Visit: Payer: PPO

## 2017-06-25 ENCOUNTER — Ambulatory Visit (INDEPENDENT_AMBULATORY_CARE_PROVIDER_SITE_OTHER): Payer: PPO

## 2017-06-25 VITALS — BP 120/64 | HR 58 | Temp 98.0°F | Resp 16 | Ht 62.0 in | Wt 137.8 lb

## 2017-06-25 DIAGNOSIS — Z0000 Encounter for general adult medical examination without abnormal findings: Secondary | ICD-10-CM | POA: Diagnosis not present

## 2017-06-25 DIAGNOSIS — E538 Deficiency of other specified B group vitamins: Secondary | ICD-10-CM | POA: Diagnosis not present

## 2017-06-25 MED ORDER — CYANOCOBALAMIN 1000 MCG/ML IJ SOLN
1000.0000 ug | Freq: Once | INTRAMUSCULAR | Status: AC
Start: 2017-06-25 — End: 2017-06-25
  Administered 2017-06-25: 1000 ug via INTRAMUSCULAR

## 2017-06-25 NOTE — Patient Instructions (Addendum)
  Nancy Hamilton , Thank you for taking time to come for your Medicare Wellness Visit. I appreciate your ongoing commitment to your health goals. Please review the following plan we discussed and let me know if I can assist you in the future.   Follow up with Dr. Derrel Nip as needed.    Bring a copy of your [place] and/or Living Will to be scanned into chart once completed.  Have a great day!  These are the goals we discussed: Goals    . Increase lean proteins          Low carb foods    . Increase physical activity          Walk for exercise using the treadmill 3 days weekly, 30 minutes       This is a list of the screening recommended for you and due dates:  Health Maintenance  Topic Date Due  . Flu Shot  06/12/2017  . Colon Cancer Screening  10/01/2017  . Mammogram  05/22/2018  . Tetanus Vaccine  12/01/2020  . DEXA scan (bone density measurement)  Completed  .  Hepatitis C: One time screening is recommended by Center for Disease Control  (CDC) for  adults born from [birth year] through 1964.   Completed  . Pneumonia vaccines  Completed

## 2017-06-25 NOTE — Progress Notes (Signed)
I have reviewed the above note and agree.  Jayliah Benett, M.D.  

## 2017-06-25 NOTE — Progress Notes (Signed)
Subjective:   SHASTA CHINN is a 71 y.o. female who presents for Medicare Annual (Subsequent) preventive examination.  Review of Systems:  No ROS.  Medicare Wellness Visit. Additional risk factors are reflected in the social history.  Cardiac Risk Factors include: advanced age (>4men, >32 women)     Objective:     Vitals: BP 120/64 (BP Location: Left Arm, Patient Position: Sitting, Cuff Size: Normal)   Pulse (!) 58   Temp 98 F (36.7 C) (Oral)   Resp 16   Ht 5\' 2"  (1.575 m)   Wt 137 lb 12.8 oz (62.5 kg)   SpO2 96%   BMI 25.20 kg/m   Body mass index is 25.2 kg/m.   Tobacco History  Smoking Status  . Former Smoker  . Quit date: 08/10/1992  Smokeless Tobacco  . Never Used     Counseling given: Not Answered   Past Medical History:  Diagnosis Date  . Allergy   . Anemia   . Anxiety   . Cancer ([place])    SKIN  . Cough    CHRONIC FROM REFLUX  . Depression   . GERD (gastroesophageal reflux disease)    SLEEPS WITH HOB ELEVATED  . Hyperlipidemia    Past Surgical History:  Procedure Laterality Date  . BACK SURGERY  1980   Ruptured Disc  . CATARACT EXTRACTION W/PHACO Right 11/28/2015   Procedure: CATARACT EXTRACTION PHACO AND INTRAOCULAR LENS PLACEMENT (IOC);  Surgeon: Estill Cotta, MD;  Location: [facility] ORS;  Service: Ophthalmology;  Laterality: Right;  [place] AP% CDE fluid pack lot # [id number]  . CATARACT EXTRACTION W/PHACO Left 12/19/2015   Procedure: CATARACT EXTRACTION PHACO AND INTRAOCULAR LENS PLACEMENT (IOC);  Surgeon: Estill Cotta, MD;  Location: [facility] ORS;  Service: Ophthalmology;  Laterality: Left;  US:1:08.2 AP%:56.7% CDE:28.10 Lot# [id number]  . COLONOSCOPY     Family History  Problem Relation Age of Onset  . Hyperlipidemia Sister   . Fibromyalgia Sister   . Breast cancer Maternal Aunt 75  . Heart disease Brother   . Diabetes Brother   . Hypertension Brother   . Diabetes Paternal Grandmother    History  Sexual Activity  . Sexual  activity: Not Currently    Outpatient Encounter Prescriptions as of 06/25/2017  Medication Sig  . Calcium Carbonate-Vit D-Min (CALCIUM 1200 PO) Take 1 tablet by mouth daily.  . Cholecalciferol (HM VITAMIN D3) 2000 UNITS CAPS Take 1 capsule by mouth daily.  . Coenzyme Q10 (CO Q 10 PO) Take 200 mg by mouth daily. Reported on 12/19/2015  . cyanocobalamin (,VITAMIN B-12,) 1000 MCG/ML injection Inject 1 ml intrumuscularly daily x 3, then weekly x 4, then monthly thereafter  . ferrous sulfate 325 (65 FE) MG tablet take 1 tablet by mouth once daily WITH BREAKFAST  . FLUoxetine (PROZAC) 20 MG capsule take 1 capsule by mouth once daily  . fluticasone (FLONASE) 50 MCG/ACT nasal spray Place 2 sprays into both nostrils daily.  Marland Kitchen levocetirizine (XYZAL) 5 MG tablet take 1 tablet by mouth every evening  . meclizine (ANTIVERT) 12.5 MG tablet Take 1 tablet (12.5 mg total) by mouth 3 (three) times daily as needed for dizziness.  . Misc Natural Products (OSTEO BI-FLEX JOINT SHIELD PO) Take 1 tablet by mouth daily.  . montelukast (SINGULAIR) 10 MG tablet Take 1 tablet (10 mg total) by mouth at bedtime.  . pantoprazole (PROTONIX) 40 MG tablet Take 40 mg by mouth daily.  Marland Kitchen RA COL-RITE 100 MG capsule take 1 capsule by mouth  twice a day  . sodium chloride (OCEAN) 0.65 % nasal spray Place 2 sprays into the nose as needed for congestion.  . Syringe, Disposable, 1 ML MISC For use with B12 injections  . triamcinolone cream (KENALOG) 0.1 % Apply 1 application topically 2 (two) times daily as needed. Reported on 12/19/2015  . [DISCONTINUED] esomeprazole (NEXIUM) 40 MG capsule Take 1 capsule (40 mg total) by mouth daily.  . [EXPIRED] cyanocobalamin ((VITAMIN B-12)) injection 1,000 mcg    No facility-administered encounter medications on file as of 06/25/2017.     Activities of Daily Living In your present state of health, do you have any difficulty performing the following activities: 06/25/2017 03/11/2017  Hearing? N N    Vision? N N  Difficulty concentrating or making decisions? N Y  Walking or climbing stairs? N N  Dressing or bathing? N N  Doing errands, shopping? N N  Preparing Food and eating ? N -  Using the Toilet? N -  In the past six months, have you accidently leaked urine? N -  Do you have problems with loss of bowel control? N -  Managing your Medications? N -  Managing your Finances? N -  Housekeeping or managing your Housekeeping? N -  Some recent data might be hidden    Patient Care Team: Crecencio Mc, MD as PCP - General (Internal Medicine)    Assessment:    This is a routine wellness examination for Layah. The goal of the wellness visit is to assist the patient how to close the gaps in care and create a preventative care plan for the patient.   The roster of all physicians providing medical care to patient is listed in the Snapshot section of the chart.  Taking calcium VIT D as appropriate/Osteoporosis risk reviewed.    Safety issues reviewed; Smoke and carbon monoxide detectors in the home. Firearms locked up in the home. Wears seatbelts when driving or riding with others. Patient does wear sunscreen or protective clothing when in direct sunlight. No violence in the home.  Patient is alert, normal appearance, oriented to person/place/and time. Correctly identified the president of the [place], recall of 3/3 words, and performing simple calculations. Displays appropriate judgement and can read correct time from watch face.   No new identified risk were noted.  No failures at ADL's or IADL's.    BMI- discussed the importance of a healthy diet, water intake and the benefits of aerobic exercise. Educational material provided.   24 hour diet recall: Breakfast: breakfast bar Lunch: none Dinner: hamburger meat, fries Snack:cookie Daily fluid intake: 0 cups of caffeine, 4 cups of water  Dental- every 6 months.  Dr. Lynelle Smoke.  Eye- Visual acuity not assessed per patient  preference since they have regular follow up with the ophthalmologist.  Wears corrective lenses.   Sleep patterns- Sleeps 8 hours at night.  Wakes feeling rested.  B12 injection administered per PCP order.  R deltoid, tolerated well. Educational material provided.  Health maintenance gaps- closed.  Patient Concerns: None at this time. Follow up with PCP as needed.  Exercise Activities and Dietary recommendations Current Exercise Habits: Home exercise routine, Frequency (Times/Week): 6, Intensity: Moderate  Goals    . Increase lean proteins          Low carb foods    . Increase physical activity          Walk for exercise using the treadmill 3 days weekly, 30 minutes      Fall  Risk Fall Risk  06/25/2017 03/11/2017 03/02/2015 02/25/2014 12/01/2012  Falls in the past year? Yes No No No No  Number falls in past yr: 1 - - - -  Injury with Fall? No - - - -  Follow up Falls prevention discussed;Education provided - - - -   Depression Screen PHQ 2/9 Scores 06/25/2017 03/11/2017 03/02/2015 02/25/2014  PHQ - 2 Score 0 0 1 0  PHQ- 9 Score 0 1 - -     Cognitive Function        Immunization History  Administered Date(s) Administered  . Influenza Split 08/10/2012  . Influenza, High Dose Seasonal PF 09/14/2016  . Influenza,inj,Quad PF,36+ Mos 08/20/2013, 08/26/2014, 09/06/2015  . Pneumococcal Conjugate-13 02/25/2014  . Pneumococcal Polysaccharide-23 02/18/2013  . Tdap 12/01/2010  . Zoster 10/20/2015   Screening Tests Health Maintenance  Topic Date Due  . INFLUENZA VACCINE  06/12/2017  . COLONOSCOPY  10/01/2017  . MAMMOGRAM  05/22/2018  . TETANUS/TDAP  12/01/2020  . DEXA SCAN  Completed  . Hepatitis C Screening  Completed  . PNA vac Low Risk Adult  Completed      Plan:   End e planning; Advanced aging; Advanced directives discussed.  No HCPOA/Living Will.  Additional information provided to help them start the conversation with family.  Copy of HCPOA/Living Will requested  upon completion. Time spent on this topic is 20 minutes.  I have personally reviewed and noted the following in the patient's chart:   . Medical and social history . Use of alcohol, tobacco or illicit drugs  . Current medications and supplements . Functional ability and status . Nutritional status . Physical activity . Advanced directives . List of other physicians . Hospitalizations, surgeries, and ER visits in previous 12 months . Vitals . Screenings to include cognitive, depression, and falls . Referrals and appointments  In addition, I have reviewed and discussed with patient certain preventive protocols, quality metrics, and best practice recommendations. A written personalized care plan for preventive services as well as general preventive health recommendations were provided to patient.     Varney Biles, LPN  [id number]/9622

## 2017-07-18 ENCOUNTER — Other Ambulatory Visit: Payer: Self-pay | Admitting: Internal Medicine

## 2017-07-30 ENCOUNTER — Ambulatory Visit (INDEPENDENT_AMBULATORY_CARE_PROVIDER_SITE_OTHER): Payer: PPO | Admitting: *Deleted

## 2017-07-30 DIAGNOSIS — E538 Deficiency of other specified B group vitamins: Secondary | ICD-10-CM

## 2017-07-30 MED ORDER — FLUOXETINE HCL 20 MG PO CAPS: 20.0000 mg | ORAL_CAPSULE | Freq: Every day | ORAL | 1 refills | Status: DC

## 2017-07-30 MED ORDER — CYANOCOBALAMIN 1000 MCG/ML IJ SOLN
1000.0000 ug | Freq: Once | INTRAMUSCULAR | Status: AC
  Administered 2017-07-30: 1000 ug via INTRAMUSCULAR

## 2017-07-30 MED ORDER — FLUTICASONE PROPIONATE 50 MCG/ACT NA SUSP: 2.0000 | Freq: Every day | NASAL | 2 refills | Status: DC

## 2017-07-30 MED ORDER — DOCUSATE SODIUM 100 MG PO CAPS: 100.0000 mg | ORAL_CAPSULE | Freq: Two times a day (BID) | ORAL | 1 refills | Status: DC

## 2017-07-30 MED ORDER — LEVOCETIRIZINE DIHYDROCHLORIDE 5 MG PO TABS: 5.0000 mg | ORAL_TABLET | Freq: Every evening | ORAL | 1 refills | Status: DC

## 2017-07-30 MED ORDER — MONTELUKAST SODIUM 10 MG PO TABS: 10.0000 mg | ORAL_TABLET | Freq: Every day | ORAL | 1 refills | Status: DC

## 2017-07-30 NOTE — Progress Notes (Addendum)
Patient presented for B 12 injection to left deltoid, patient voiced no concerns nor showed any signs of distress during injection.  Patient requested 90 day supply on medications filled all for 90 day supply except any control.  Reviewed.  Dr Nicki Reaper

## 2017-09-03 ENCOUNTER — Ambulatory Visit (INDEPENDENT_AMBULATORY_CARE_PROVIDER_SITE_OTHER): Payer: PPO | Admitting: *Deleted

## 2017-09-03 DIAGNOSIS — Z23 Encounter for immunization: Secondary | ICD-10-CM

## 2017-09-03 DIAGNOSIS — E538 Deficiency of other specified B group vitamins: Secondary | ICD-10-CM | POA: Diagnosis not present

## 2017-09-03 MED ORDER — CYANOCOBALAMIN 1000 MCG/ML IJ SOLN
1000.0000 ug | Freq: Once | INTRAMUSCULAR | Status: AC
  Administered 2017-09-03: 1000 ug via INTRAMUSCULAR

## 2017-09-03 NOTE — Progress Notes (Addendum)
Patient presented for B 12 injection to right deltoid, patient voiced no concerns nor showed any signs of distress during injection.  Reviewed.  Dr Scott 

## 2017-09-12 ENCOUNTER — Telehealth: Payer: Self-pay | Admitting: Gastroenterology

## 2017-09-12 NOTE — Telephone Encounter (Signed)
Patient called and is ready to schedule her colonoscopy with Dr. Allen Norris. Would like for it to be done this month.

## 2017-09-12 NOTE — Telephone Encounter (Signed)
Returned patient's call to schedule.   No answer. Unable to lv voicemail due to no setup.

## 2017-09-19 ENCOUNTER — Telehealth: Payer: Self-pay | Admitting: Gastroenterology

## 2017-09-19 IMAGING — MG MM DIGITAL SCREENING BILAT W/ TOMO W/ CAD
9 of 13 series · 9 of 29 positions shown · non-contrast
Comparison: Previous exam(s).

CLINICAL DATA: Screening.

EXAM:
2D DIGITAL SCREENING BILATERAL MAMMOGRAM WITH CAD AND ADJUNCT TOMO

[L MLO (1 of 2)]
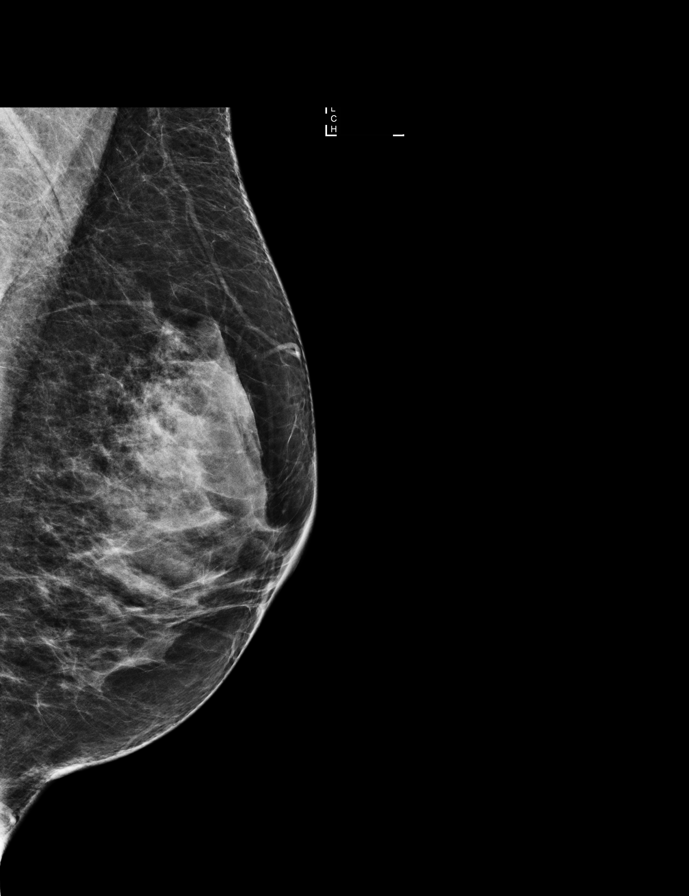

[R MLO synth-2D]
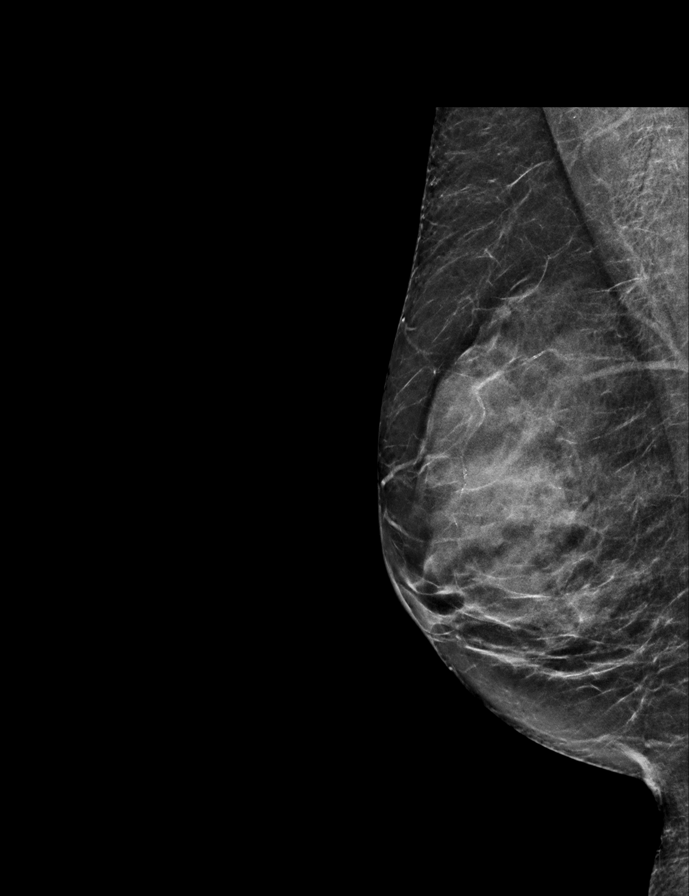

[L CC synth-2D]
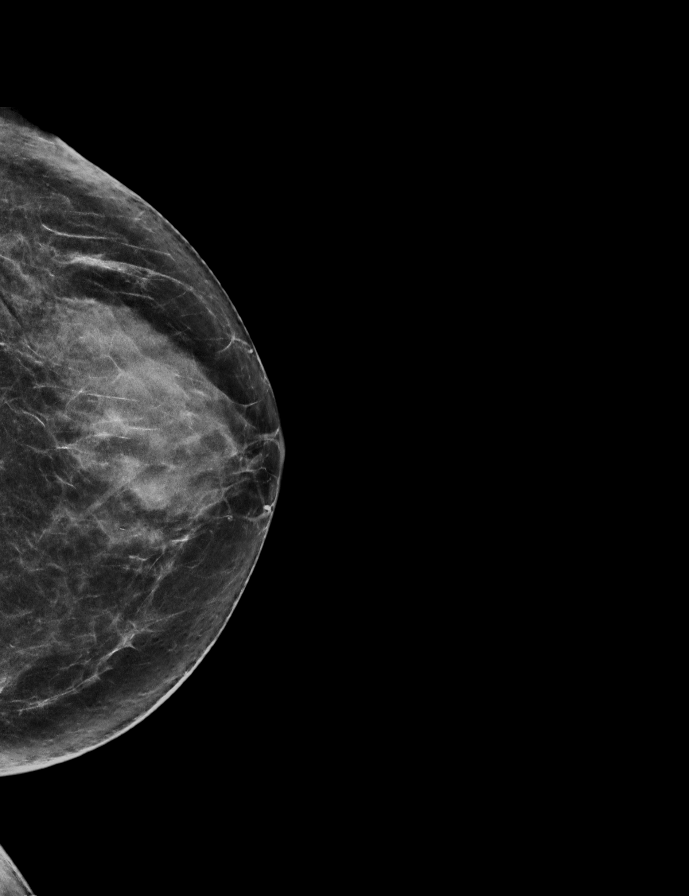

[R CC synth-2D]
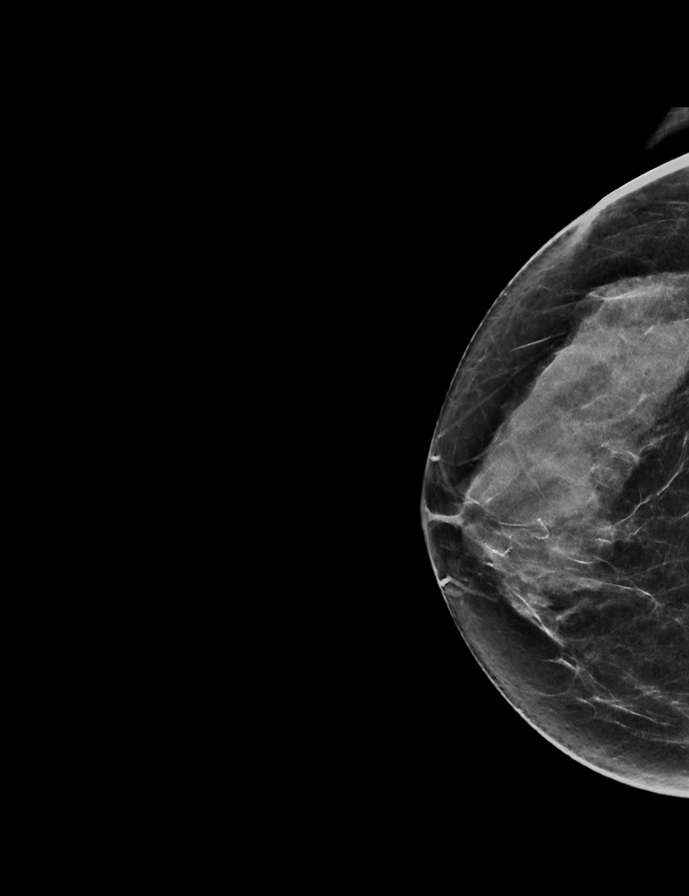

[R CC]
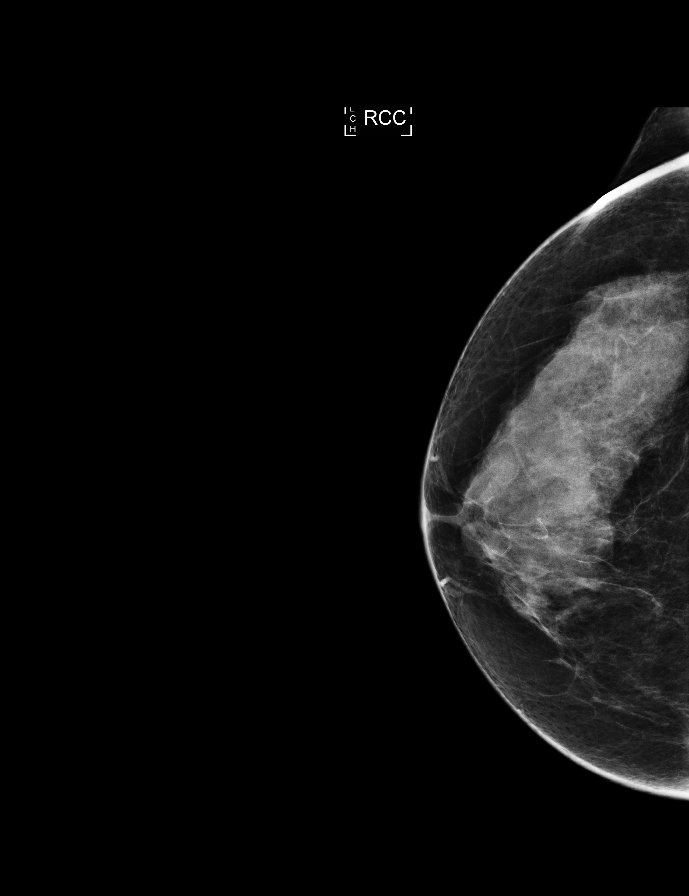

[R MLO]
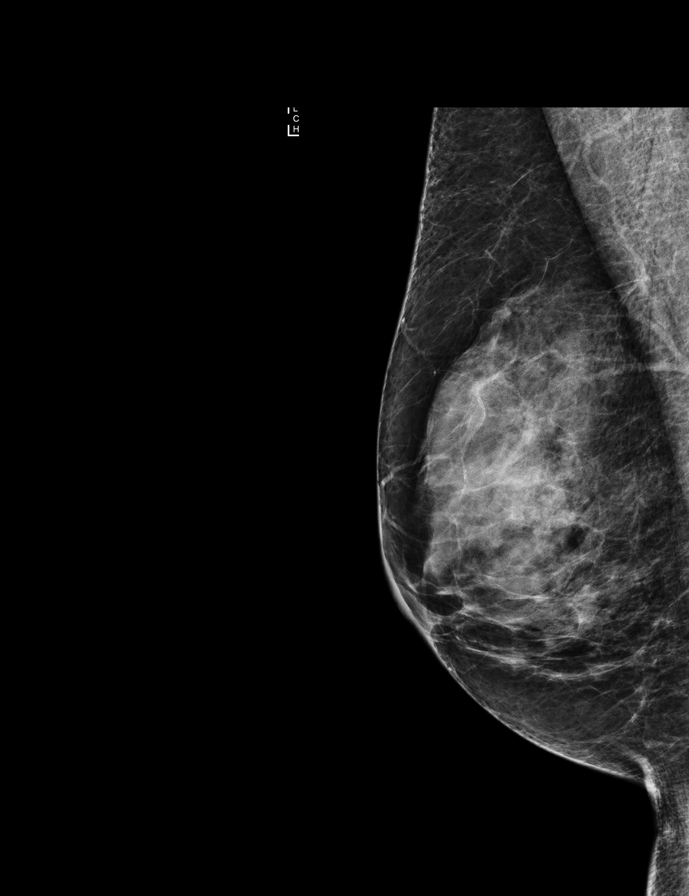

[L MLO synth-2D]
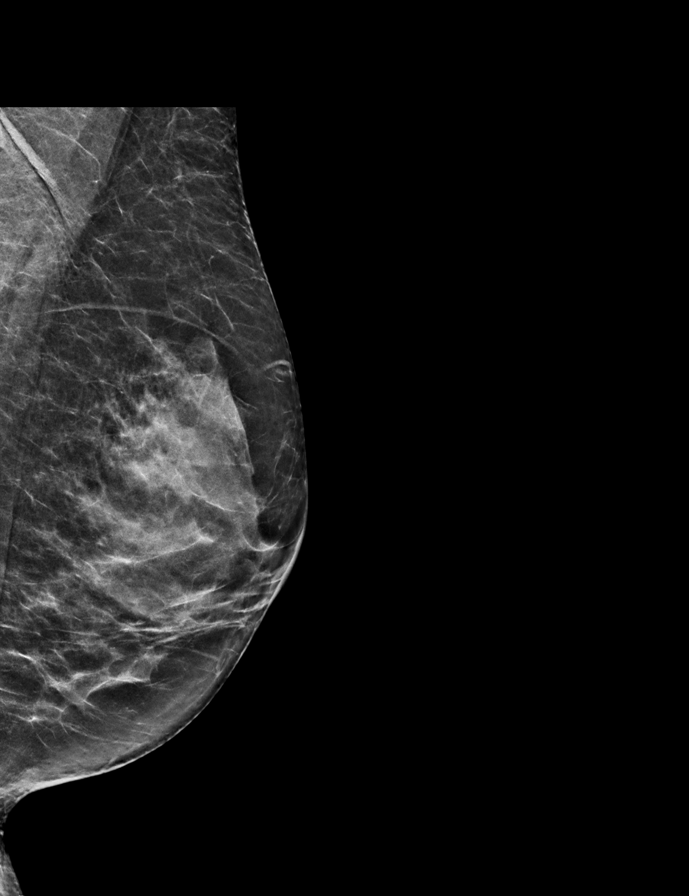

[L CC]
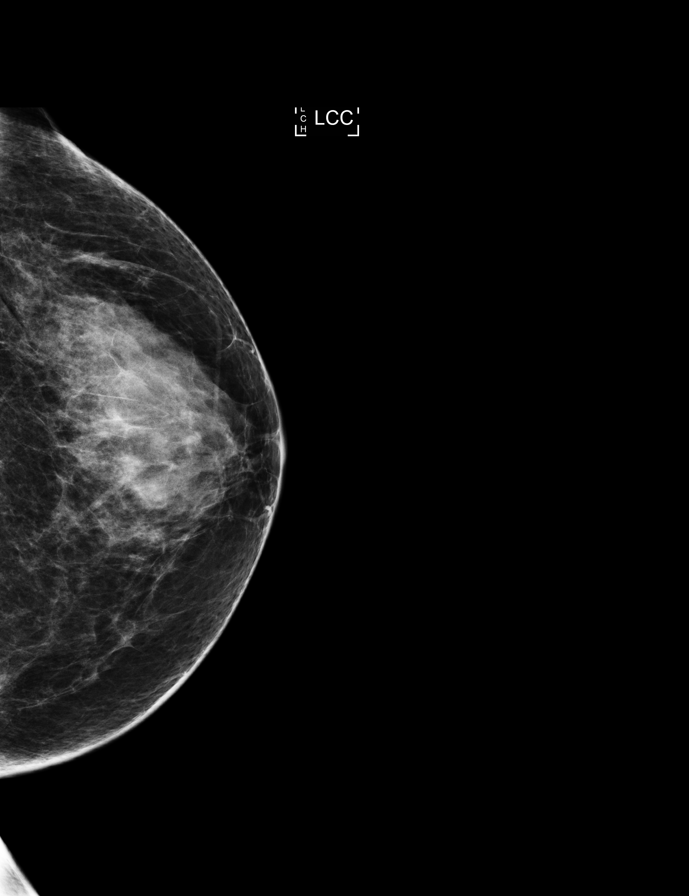

[L MLO (2 of 2)]
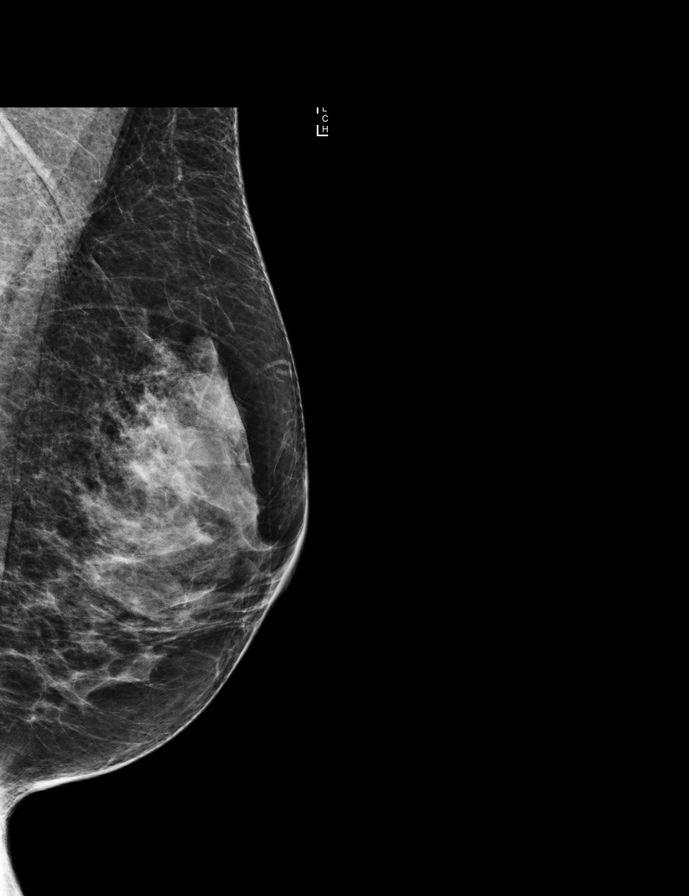

[9 of 29 positions shown; findings below may reference images not displayed]

ACR Breast Density Category d: The breast tissue is extremely dense,
which lowers the sensitivity of mammography.
FINDINGS: There are no findings suspicious for malignancy. Images were
processed with CAD.
IMPRESSION: No mammographic evidence of malignancy. A result letter of this
screening mammogram will be mailed directly to the patient.

RECOMMENDATION:
Screening mammogram in one year. (Code:US-D-RZ7)

BI-RADS CATEGORY  1: Negative.

## 2017-09-19 NOTE — Telephone Encounter (Signed)
Patient left a voice message that she is once again trying to set up her colonoscopy. Please call her ASAP. She wants it done this year.

## 2017-09-20 ENCOUNTER — Other Ambulatory Visit: Payer: Self-pay

## 2017-09-20 DIAGNOSIS — Z86010 Personal history of colonic polyps: Secondary | ICD-10-CM

## 2017-09-20 MED ORDER — PEG 3350-KCL-NA BICARB-NACL 420 G PO SOLR: 4000.0000 mL | Freq: Once | ORAL | 0 refills | Status: AC

## 2017-09-27 ENCOUNTER — Telehealth: Payer: Self-pay | Admitting: Gastroenterology

## 2017-09-27 NOTE — Telephone Encounter (Signed)
Please call patient as she is confused about her procedure date?????

## 2017-09-30 NOTE — Telephone Encounter (Signed)
Returned patient's call pertaining to procedure schedule date.   11/29 @ [place]

## 2017-10-02 ENCOUNTER — Telehealth: Payer: Self-pay | Admitting: Gastroenterology

## 2017-10-02 ENCOUNTER — Other Ambulatory Visit: Payer: Self-pay

## 2017-10-02 ENCOUNTER — Encounter: Payer: Self-pay | Admitting: *Deleted

## 2017-10-02 NOTE — Telephone Encounter (Signed)
Tried contacting pt but vm was not set up yet.  

## 2017-10-02 NOTE — Telephone Encounter (Signed)
Spoke with pt and went over instructions for the Golytely. Pt received that vs the Suprep. All questions were answered.

## 2017-10-02 NOTE — Telephone Encounter (Signed)
Please call patient as she has prepping question she LVM.

## 2017-10-05 ENCOUNTER — Other Ambulatory Visit: Payer: Self-pay | Admitting: Internal Medicine

## 2017-10-07 ENCOUNTER — Telehealth: Payer: Self-pay | Admitting: Gastroenterology

## 2017-10-07 NOTE — Telephone Encounter (Signed)
Went over prepping instructions with pt as she was confused about when to stop having fluids. All questions answered and pt verbalized understanding.

## 2017-10-07 NOTE — Telephone Encounter (Signed)
Please call patient as she is confused on her prepping information.

## 2017-10-08 ENCOUNTER — Ambulatory Visit (INDEPENDENT_AMBULATORY_CARE_PROVIDER_SITE_OTHER): Payer: PPO | Admitting: *Deleted

## 2017-10-08 DIAGNOSIS — E538 Deficiency of other specified B group vitamins: Secondary | ICD-10-CM | POA: Diagnosis not present

## 2017-10-08 MED ORDER — CYANOCOBALAMIN 1000 MCG/ML IJ SOLN
1000.0000 ug | Freq: Once | INTRAMUSCULAR | Status: AC
  Administered 2017-10-08: 1000 ug via INTRAMUSCULAR

## 2017-10-08 NOTE — Progress Notes (Addendum)
Patient presented for B 12 injection to left deltoid, patient voiced no concerns nor showed any signs of distress during injection.  Reviewed.  Dr Scott 

## 2017-10-09 NOTE — Discharge Instructions (Signed)
General Anesthesia, Adult, Care After °These instructions provide you with information about caring for yourself after your procedure. Your health care provider may also give you more specific instructions. Your treatment has been planned according to current medical practices, but problems sometimes occur. Call your health care provider if you have any problems or questions after your procedure. °What can I expect after the procedure? °After the procedure, it is common to have: °· Vomiting. °· A sore throat. °· Mental slowness. ° °It is common to feel: °· Nauseous. °· Cold or shivery. °· Sleepy. °· Tired. °· Sore or achy, even in parts of your body where you did not have surgery. ° °Follow these instructions at home: °For at least 24 hours after the procedure: °· Do not: °? Participate in activities where you could fall or become injured. °? Drive. °? Use heavy machinery. °? Drink alcohol. °? Take sleeping pills or medicines that cause drowsiness. °? Make important decisions or sign legal documents. °? Take care of children on your own. °· Rest. °Eating and drinking °· If you vomit, drink water, juice, or soup when you can drink without vomiting. °· Drink enough fluid to keep your urine clear or pale yellow. °· Make sure you have little or no nausea before eating solid foods. °· Follow the diet recommended by your health care provider. °General instructions °· Have a responsible adult stay with you until you are awake and alert. °· Return to your normal activities as told by your health care provider. Ask your health care provider what activities are safe for you. °· Take over-the-counter and prescription medicines only as told by your health care provider. °· If you smoke, do not smoke without supervision. °· Keep all follow-up visits as told by your health care provider. This is important. °Contact a health care provider if: °· You continue to have nausea or vomiting at home, and medicines are not helpful. °· You  cannot drink fluids or start eating again. °· You cannot urinate after 8-12 hours. °· You develop a skin rash. °· You have fever. °· You have increasing redness at the site of your procedure. °Get help right away if: °· You have difficulty breathing. °· You have chest pain. °· You have unexpected bleeding. °· You feel that you are having a life-threatening or urgent problem. °This information is not intended to replace advice given to you by your health care provider. Make sure you discuss any questions you have with your health care provider. °Document Released: 02/04/2001 Document Revised: 04/02/2016 Document Reviewed: 10/13/2015 °Elsevier Interactive Patient Education © 2018 [employer] ° °

## 2017-10-10 ENCOUNTER — Ambulatory Visit: Payer: PPO | Admitting: Anesthesiology

## 2017-10-10 ENCOUNTER — Encounter
Admission: RE | Disposition: A | Discharge status: Home / Self Care | Payer: Self-pay | Source: Ambulatory Visit | Attending: Gastroenterology

## 2017-10-10 ENCOUNTER — Ambulatory Visit
Admission: RE | Admit: 2017-10-10 | Discharge: 2017-10-10 | Disposition: A | Discharge status: Home / Self Care | Payer: PPO | Source: Ambulatory Visit | Attending: Gastroenterology | Admitting: Gastroenterology

## 2017-10-10 DIAGNOSIS — Z860100 Personal history of colon polyps, unspecified: Secondary | ICD-10-CM

## 2017-10-10 DIAGNOSIS — Z8249 Family history of ischemic heart disease and other diseases of the circulatory system: Secondary | ICD-10-CM | POA: Insufficient documentation

## 2017-10-10 DIAGNOSIS — K5730 Diverticulosis of large intestine without perforation or abscess without bleeding: Secondary | ICD-10-CM | POA: Diagnosis not present

## 2017-10-10 DIAGNOSIS — K640 First degree hemorrhoids: Secondary | ICD-10-CM | POA: Insufficient documentation

## 2017-10-10 DIAGNOSIS — M47812 Spondylosis without myelopathy or radiculopathy, cervical region: Secondary | ICD-10-CM | POA: Diagnosis not present

## 2017-10-10 DIAGNOSIS — Z85828 Personal history of other malignant neoplasm of skin: Secondary | ICD-10-CM | POA: Insufficient documentation

## 2017-10-10 DIAGNOSIS — D124 Benign neoplasm of descending colon: Secondary | ICD-10-CM | POA: Diagnosis not present

## 2017-10-10 DIAGNOSIS — F329 Major depressive disorder, single episode, unspecified: Secondary | ICD-10-CM | POA: Insufficient documentation

## 2017-10-10 DIAGNOSIS — E785 Hyperlipidemia, unspecified: Secondary | ICD-10-CM | POA: Diagnosis not present

## 2017-10-10 DIAGNOSIS — Z79899 Other long term (current) drug therapy: Secondary | ICD-10-CM | POA: Diagnosis not present

## 2017-10-10 DIAGNOSIS — Z87891 Personal history of nicotine dependence: Secondary | ICD-10-CM | POA: Diagnosis not present

## 2017-10-10 DIAGNOSIS — K219 Gastro-esophageal reflux disease without esophagitis: Secondary | ICD-10-CM | POA: Insufficient documentation

## 2017-10-10 DIAGNOSIS — F419 Anxiety disorder, unspecified: Secondary | ICD-10-CM | POA: Diagnosis not present

## 2017-10-10 DIAGNOSIS — Z1211 Encounter for screening for malignant neoplasm of colon: Secondary | ICD-10-CM | POA: Insufficient documentation

## 2017-10-10 HISTORY — DX: Unspecified osteoarthritis, unspecified site: M19.90

## 2017-10-10 HISTORY — PX: POLYPECTOMY: SHX5525

## 2017-10-10 HISTORY — PX: COLONOSCOPY WITH PROPOFOL: SHX5780

## 2017-10-10 SURGERY — COLONOSCOPY WITH PROPOFOL
Anesthesia: General | Wound class: Contaminated

## 2017-10-10 MED ORDER — MEPERIDINE HCL 25 MG/ML IJ SOLN: 6.2500 mg | INTRAMUSCULAR | Status: DC | PRN

## 2017-10-10 MED ORDER — PROPOFOL 10 MG/ML IV BOLUS
INTRAVENOUS | Status: DC | PRN
  Administered 2017-10-10 (×3): 50 mg via INTRAVENOUS
  Administered 2017-10-10: 30 mg via INTRAVENOUS
  Administered 2017-10-10: 20 mg via INTRAVENOUS
  Administered 2017-10-10: 100 mg via INTRAVENOUS

## 2017-10-10 MED ORDER — STERILE WATER FOR IRRIGATION IR SOLN
Status: DC | PRN
  Administered 2017-10-10: 08:00:00

## 2017-10-10 MED ORDER — FENTANYL CITRATE (PF) 100 MCG/2ML IJ SOLN: 25.0000 ug | INTRAMUSCULAR | Status: DC | PRN

## 2017-10-10 MED ORDER — LIDOCAINE HCL (CARDIAC) 20 MG/ML IV SOLN
INTRAVENOUS | Status: DC | PRN
Start: 2017-10-10 — End: 2017-10-10
  Administered 2017-10-10: 40 mg via INTRAVENOUS

## 2017-10-10 MED ORDER — LACTATED RINGERS IV SOLN
10.0000 mL/h | INTRAVENOUS | Status: DC
  Administered 2017-10-10: 08:00:00 via INTRAVENOUS

## 2017-10-10 MED ORDER — PROMETHAZINE HCL 25 MG/ML IJ SOLN: 6.2500 mg | INTRAMUSCULAR | Status: DC | PRN

## 2017-10-10 SURGICAL SUPPLY — 23 items
CANISTER SUCT 1200ML W/VALVE (MISCELLANEOUS) ×3 IMPLANT
CLIP HMST 235XBRD CATH ROT (MISCELLANEOUS) IMPLANT
CLIP RESOLUTION 360 11X235 (MISCELLANEOUS)
FCP ESCP3.2XJMB 240X2.8X (MISCELLANEOUS)
FORCEPS BIOP RAD 4 LRG CAP 4 (CUTTING FORCEPS) ×1 IMPLANT
FORCEPS BIOP RJ4 240 W/NDL (MISCELLANEOUS)
FORCEPS ESCP3.2XJMB 240X2.8X (MISCELLANEOUS) IMPLANT
GOWN CVR UNV OPN BCK APRN NK (MISCELLANEOUS) ×4 IMPLANT
GOWN ISOL THUMB LOOP REG UNIV (MISCELLANEOUS) ×6
INJECTOR VARIJECT VIN23 (MISCELLANEOUS) IMPLANT
KIT DEFENDO VALVE AND CONN (KITS) IMPLANT
KIT ENDO PROCEDURE OLY (KITS) ×3 IMPLANT
MARKER SPOT ENDO TATTOO 5ML (MISCELLANEOUS) IMPLANT
PAD GROUND ADULT SPLIT (MISCELLANEOUS) IMPLANT
PROBE APC STR FIRE (PROBE) IMPLANT
RETRIEVER NET ROTH 2.5X230 LF (MISCELLANEOUS) IMPLANT
SNARE SHORT THROW 13M SML OVAL (MISCELLANEOUS) IMPLANT
SNARE SHORT THROW 30M LRG OVAL (MISCELLANEOUS) IMPLANT
SNARE SNG USE RND 15MM (INSTRUMENTS) IMPLANT
SPOT EX ENDOSCOPIC TATTOO (MISCELLANEOUS)
TRAP ETRAP POLY (MISCELLANEOUS) IMPLANT
VARIJECT INJECTOR VIN23 (MISCELLANEOUS)
WATER STERILE IRR 250ML POUR (IV SOLUTION) ×3 IMPLANT

## 2017-10-10 NOTE — Op Note (Signed)
[facility] Patient Name: Nancy Hamilton Procedure Date: 10/10/2017 8:01 AM MRN: [id number] Account #: [id number] Date of Birth: 03-29-1946 Admit Type: Outpatient Age: 71 Room: [facility] OR ROOM 01 Gender: Female Note Status: Finalized Procedure:            Colonoscopy Indications:          High risk colon cancer surveillance: Personal history                        of colonic polyps Providers:            Lucilla Lame MD, MD Referring MD:         Deborra Medina, MD (Referring MD) Medicines:            Propofol per Anesthesia Complications:        No immediate complications. Procedure:            Pre-Anesthesia Assessment:                       - Prior to the procedure, a History and Physical was                        performed, and patient medications and allergies were                        reviewed. The patient's tolerance of previous                        anesthesia was also reviewed. The risks and benefits of                        the procedure and the sedation options and risks were                        discussed with the patient. All questions were                        answered, and informed consent was obtained. Prior                        Anticoagulants: The patient has taken no previous                        anticoagulant or antiplatelet agents. ASA Grade                        Assessment: II - A patient with mild systemic disease.                        After reviewing the risks and benefits, the patient was                        deemed in satisfactory condition to undergo the                        procedure.                       After obtaining informed consent, the colonoscope was  passed under direct vision. Throughout the procedure,                        the patient's blood pressure, pulse, and oxygen                        saturations were monitored continuously. The Olympus                         Colonoscope [id number]) was introduced through the                        anus and advanced to the the cecum, identified by                        appendiceal orifice and ileocecal valve. The                        colonoscopy was performed without difficulty. The                        patient tolerated the procedure well. The quality of                        the bowel preparation was excellent. Findings:      The perianal and digital rectal examinations were normal.      A 2 mm polyp was found in the descending colon. The polyp was sessile.       The polyp was removed with a cold biopsy forceps. Resection and       retrieval were complete.      A few small-mouthed diverticula were found in the sigmoid colon.      Non-bleeding internal hemorrhoids were found during retroflexion. The       hemorrhoids were Grade I (internal hemorrhoids that do not prolapse). Impression:           - One 2 mm polyp in the descending colon, removed with                        a cold biopsy forceps. Resected and retrieved.                       - Diverticulosis in the sigmoid colon.                       - Non-bleeding internal hemorrhoids. Recommendation:       - Discharge patient to home.                       - Resume previous diet.                       - Continue present medications.                       - Repeat colonoscopy in 5 years for surveillance. Procedure Code(s):    --- Professional ---                       [phone], Colonoscopy, flexible; with biopsy, single or  multiple Diagnosis Code(s):    --- Professional ---                       Z86.010, Personal history of colonic polyps                       D12.4, Benign neoplasm of descending colon CPT copyright 2016 American [employer] Association. All rights reserved. The codes documented in this report are preliminary and upon coder review may  be revised to meet current compliance requirements. Lucilla Lame MD,  MD 10/10/2017 8:21:17 AM This report has been signed electronically. Number of Addenda: 0 Note Initiated On: 10/10/2017 8:01 AM Scope Withdrawal Time: 0 hours 7 minutes 29 seconds  Total Procedure Duration: 0 hours 11 minutes 27 seconds       [facility]

## 2017-10-10 NOTE — Transfer of Care (Signed)
Immediate Anesthesia Transfer of Care Note  Patient: Nancy Hamilton  Procedure(s) Performed: COLONOSCOPY WITH PROPOFOL (N/A ) POLYPECTOMY  Patient Location: PACU  Anesthesia Type: General  Level of Consciousness: awake, alert  and patient cooperative  Airway and Oxygen Therapy: Patient Spontanous Breathing and Patient connected to supplemental oxygen  Post-op Assessment: Post-op Vital signs reviewed, Patient's Cardiovascular Status Stable, Respiratory Function Stable, Patent Airway and No signs of Nausea or vomiting  Post-op Vital Signs: Reviewed and stable  Complications: No apparent anesthesia complications

## 2017-10-10 NOTE — Anesthesia Preprocedure Evaluation (Signed)
Anesthesia Evaluation  Patient identified by MRN, date of birth, ID band Patient awake    History of Anesthesia Complications Negative for: history of anesthetic complications  Airway Mallampati: II  TM Distance: >3 FB Neck ROM: Full    Dental  (+) Implants No loose or chipped teeth; permanent implants:   Pulmonary former smoker (quit 1993),    Pulmonary exam normal breath sounds clear to auscultation       Cardiovascular Exercise Tolerance: Good negative cardio ROS Normal cardiovascular exam Rhythm:Regular Rate:Normal     Neuro/Psych PSYCHIATRIC DISORDERS Anxiety Depression    GI/Hepatic Neg liver ROS, GERD  Medicated,  Endo/Other  negative endocrine ROS  Renal/GU negative Renal ROS     Musculoskeletal  (+) Arthritis ,   Abdominal   Peds  Hematology  (+) Blood dyscrasia, anemia ,   Anesthesia Other Findings   Reproductive/Obstetrics                             Anesthesia Physical Anesthesia Plan  ASA: II  Anesthesia Plan: General   Post-op Pain Management:    Induction: Intravenous  PONV Risk Score and Plan: 1 and Propofol infusion and Ondansetron  Airway Management Planned: Natural Airway  Additional Equipment:   Intra-op Plan:   Post-operative Plan:   Informed Consent: I have reviewed the patients History and Physical, chart, labs and discussed the procedure including the risks, benefits and alternatives for the proposed anesthesia with the patient or authorized representative who has indicated his/her understanding and acceptance.     Plan Discussed with: CRNA  Anesthesia Plan Comments:         Anesthesia Quick Evaluation

## 2017-10-10 NOTE — Anesthesia Postprocedure Evaluation (Signed)
Anesthesia Post Note  Patient: Nancy Hamilton  Procedure(s) Performed: COLONOSCOPY WITH PROPOFOL (N/A ) POLYPECTOMY  Patient location during evaluation: PACU Anesthesia Type: General Level of consciousness: awake and alert, oriented and patient cooperative Pain management: pain level controlled Vital Signs Assessment: post-procedure vital signs reviewed and stable Respiratory status: spontaneous breathing, nonlabored ventilation and respiratory function stable Cardiovascular status: blood pressure returned to baseline and stable Postop Assessment: adequate PO intake Anesthetic complications: no    Darrin Nipper

## 2017-10-10 NOTE — H&P (Signed)
Lucilla Lame, MD [address] [place], [place] [address] Phone:[phone] Fax : [phone]  Primary Care Physician:  Crecencio Mc, MD Primary Gastroenterologist:  Dr. Allen Norris  Pre-Procedure History & Physical: HPI:  Nancy Hamilton is a 71 y.o. female is here for an colonoscopy.   Past Medical History:  Diagnosis Date  . Allergy   . Anemia   . Anxiety   . Arthritis    neck, no limitations  . Cancer ([place])    SKIN  . Cough    CHRONIC FROM REFLUX  . Depression   . GERD (gastroesophageal reflux disease)    SLEEPS WITH HOB ELEVATED  . Hyperlipidemia     Past Surgical History:  Procedure Laterality Date  . BACK SURGERY  1980   Ruptured Disc  . CATARACT EXTRACTION W/PHACO Right 11/28/2015   Procedure: CATARACT EXTRACTION PHACO AND INTRAOCULAR LENS PLACEMENT (IOC);  Surgeon: Estill Cotta, MD;  Location: [facility] ORS;  Service: Ophthalmology;  Laterality: Right;  [place] AP% CDE fluid pack lot # [id number]  . CATARACT EXTRACTION W/PHACO Left 12/19/2015   Procedure: CATARACT EXTRACTION PHACO AND INTRAOCULAR LENS PLACEMENT (IOC);  Surgeon: Estill Cotta, MD;  Location: [facility] ORS;  Service: Ophthalmology;  Laterality: Left;  US:1:08.2 AP%:56.7% CDE:28.10 Lot# [id number]  . COLONOSCOPY      Prior to Admission medications   Medication Sig Start Date End Date Taking? Authorizing Provider  Calcium Carbonate-Vit D-Min (CALCIUM 1200 PO) Take 1 tablet by mouth daily.   Yes [provider]  Cholecalciferol (HM VITAMIN D3) 2000 UNITS CAPS Take 1 capsule by mouth daily.   Yes [provider]  cyanocobalamin (,VITAMIN B-12,) 1000 MCG/ML injection Inject 1 ml intrumuscularly daily x 3, then weekly x 4, then monthly thereafter 06/08/14  Yes Crecencio Mc, MD  docusate sodium (RA COL-RITE) 100 MG capsule Take 1 capsule (100 mg total) by mouth 2 (two) times daily. 07/30/17  Yes Crecencio Mc, MD  ferrous sulfate 325 (65 FE) MG tablet take 1 tablet by mouth  once daily WITH BREAKFAST 05/10/17  Yes Crecencio Mc, MD  FLUoxetine (PROZAC) 20 MG capsule Take 1 capsule (20 mg total) by mouth daily. 07/30/17  Yes Crecencio Mc, MD  fluticasone (FLONASE) 50 MCG/ACT nasal spray Place 2 sprays into both nostrils daily. 07/30/17  Yes Crecencio Mc, MD  levocetirizine (XYZAL) 5 MG tablet Take 1 tablet (5 mg total) by mouth every evening. 07/30/17  Yes Crecencio Mc, MD  meclizine (ANTIVERT) 12.5 MG tablet Take 1 tablet (12.5 mg total) by mouth 3 (three) times daily as needed for dizziness. 11/14/16  Yes Arnett, Yvetta Coder, FNP  Misc Natural Products (OSTEO BI-FLEX JOINT SHIELD PO) Take 1 tablet by mouth daily.   Yes [provider]  montelukast (SINGULAIR) 10 MG tablet Take 1 tablet (10 mg total) by mouth at bedtime. 07/30/17  Yes Crecencio Mc, MD  pantoprazole (PROTONIX) 40 MG tablet take 1 tablet by mouth once daily 10/07/17  Yes Crecencio Mc, MD  sodium chloride (OCEAN) 0.65 % nasal spray Place 2 sprays into the nose as needed for congestion.   Yes [provider]  Syringe, Disposable, 1 ML MISC For use with B12 injections 06/08/14  Yes Crecencio Mc, MD  triamcinolone cream (KENALOG) 0.1 % Apply 1 application topically 2 (two) times daily as needed. Reported on 12/19/2015 12/29/14  Yes [provider]  Coenzyme Q10 (CO Q 10 PO) Take 200 mg by mouth daily.  Reported on 12/19/2015 11/13/15   [provider]    Allergies as of 09/20/2017 - Review Complete 06/25/2017  Allergen Reaction Noted  . Tylox [oxycodone-acetaminophen] Rash 08/10/2012    Family History  Problem Relation Age of Onset  . Hyperlipidemia Sister   . Fibromyalgia Sister   . Breast cancer Maternal Aunt 75  . Heart disease Brother   . Diabetes Brother   . Hypertension Brother   . Diabetes Paternal Grandmother     Social History   Socioeconomic History  . Marital status: Widowed    Spouse name: Not on file  . Number of children: Not on file  .  Years of education: Not on file  . Highest education level: Not on file  Social Needs  . Financial resource strain: Not on file  . Food insecurity - worry: Not on file  . Food insecurity - inability: Not on file  . Transportation needs - medical: Not on file  . Transportation needs - non-medical: Not on file  Occupational History  . Not on file  Tobacco Use  . Smoking status: Former Smoker    Last attempt to quit: 08/10/1992    Years since quitting: 25.1  . Smokeless tobacco: Never Used  Substance and Sexual Activity  . Alcohol use: No  . Drug use: No  . Sexual activity: Not Currently  Other Topics Concern  . Not on file  Social History Narrative   Marital status: married; husband with progressively worsening neurological disorder; Hospice involved in 2013.     Living with: husband.     Employment: working 40 hours per week.      Tobacco: former user.      Alcohol: none      Drugs: none      Exercise: none; physically demanding job.    Review of Systems: See HPI, otherwise negative ROS  Physical Exam: BP (!) 150/85   Pulse 66   Temp 97.6 F (36.4 C) (Temporal)   Ht 5\' 2"  (1.575 m)   Wt 135 lb (61.2 kg)   SpO2 97%   BMI 24.69 kg/m  General:   Alert,  pleasant and cooperative in NAD Head:  Normocephalic and atraumatic. Neck:  Supple; no masses or thyromegaly. Lungs:  Clear throughout to auscultation.    Heart:  Regular rate and rhythm. Abdomen:  Soft, nontender and nondistended. Normal bowel sounds, without guarding, and without rebound.   Neurologic:  Alert and  oriented x4;  grossly normal neurologically.  Impression/Plan: Nancy Hamilton is here for an colonoscopy to be performed for history of colon polyps  Risks, benefits, limitations, and alternatives regarding  colonoscopy have been reviewed with the patient.  Questions have been answered.  All parties agreeable.   Lucilla Lame, MD  10/10/2017, 7:41 AM

## 2017-10-10 NOTE — Anesthesia Procedure Notes (Signed)
Procedure Name: MAC Date/Time: 10/10/2017 8:01 AM Performed by: Janna Arch, CRNA Pre-anesthesia Checklist: Emergency Drugs available, Patient identified, Suction available and Patient being monitored Patient Re-evaluated:Patient Re-evaluated prior to induction Oxygen Delivery Method: Nasal cannula

## 2017-10-11 ENCOUNTER — Encounter: Payer: Self-pay | Admitting: Gastroenterology

## 2017-10-12 ENCOUNTER — Encounter: Payer: Self-pay | Admitting: Gastroenterology

## 2017-10-31 DIAGNOSIS — D3132 Benign neoplasm of left choroid: Secondary | ICD-10-CM | POA: Diagnosis not present

## 2017-11-07 ENCOUNTER — Telehealth: Payer: Self-pay | Admitting: Internal Medicine

## 2017-11-07 MED ORDER — PANTOPRAZOLE SODIUM 40 MG PO TBEC: 40.0000 mg | DELAYED_RELEASE_TABLET | Freq: Every day | ORAL | 2 refills | Status: DC

## 2017-11-07 NOTE — Telephone Encounter (Signed)
Copied from [place] [phone]. Topic: Quick Communication - See Telephone Encounter >> Nov 07, 2017  2:02 PM Hewitt Shorts wrote: CRM for notification. See Telephone encounter for:  Pt is needing a refill on protonix 40 mg  She states that she gets enough for 3 months best number [phone] til 5 then [phone]   11/07/17.

## 2017-11-13 ENCOUNTER — Ambulatory Visit (INDEPENDENT_AMBULATORY_CARE_PROVIDER_SITE_OTHER): Payer: PPO | Admitting: *Deleted

## 2017-11-13 DIAGNOSIS — E538 Deficiency of other specified B group vitamins: Secondary | ICD-10-CM

## 2017-11-13 MED ORDER — CYANOCOBALAMIN 1000 MCG/ML IJ SOLN
1000.0000 ug | Freq: Once | INTRAMUSCULAR | Status: AC
  Administered 2017-11-13: 1000 ug via INTRAMUSCULAR

## 2017-11-13 NOTE — Progress Notes (Addendum)
Patient presented for B 12 injection to right deltoid, patient voiced no concerns nor showed any signs of distress during injection.  ,  I have reviewed the above information and agree with above.   Deborra Medina, MD

## 2017-11-19 ENCOUNTER — Other Ambulatory Visit: Payer: Self-pay | Admitting: Internal Medicine

## 2017-12-05 ENCOUNTER — Telehealth: Payer: Self-pay | Admitting: Internal Medicine

## 2017-12-05 MED ORDER — PANTOPRAZOLE SODIUM 40 MG PO TBEC: 40.0000 mg | DELAYED_RELEASE_TABLET | Freq: Every day | ORAL | 1 refills | Status: DC

## 2017-12-05 NOTE — Telephone Encounter (Signed)
Please advise 

## 2017-12-05 NOTE — Telephone Encounter (Signed)
Medication has been refilled.

## 2017-12-05 NOTE — Telephone Encounter (Signed)
Copied from [place] [phone]. Topic: Quick Communication - See Telephone Encounter >> Dec 05, 2017  1:10 PM Aurelio Brash B wrote: CRM for notification. See Telephone encounter for:  Pt is requesting rx for pantoprazole (PROTONIX) 40 MG tablet  for 90 day supply    [id number], [place] [phone] (Phone) [phone] (Fax)    12/05/17.

## 2017-12-17 ENCOUNTER — Ambulatory Visit (INDEPENDENT_AMBULATORY_CARE_PROVIDER_SITE_OTHER): Payer: PPO | Admitting: *Deleted

## 2017-12-17 DIAGNOSIS — E538 Deficiency of other specified B group vitamins: Secondary | ICD-10-CM

## 2017-12-17 MED ORDER — CYANOCOBALAMIN 1000 MCG/ML IJ SOLN
1000.0000 ug | Freq: Once | INTRAMUSCULAR | Status: AC
  Administered 2017-12-17: 1000 ug via INTRAMUSCULAR

## 2017-12-17 NOTE — Progress Notes (Addendum)
Patient presented for B 12 injection to left deltoid, patient voiced no concerns nor showed any signs of distress during injection.  Reviewed.  Dr Scott 

## 2018-01-02 ENCOUNTER — Telehealth: Payer: Self-pay | Admitting: Internal Medicine

## 2018-01-02 ENCOUNTER — Other Ambulatory Visit: Payer: Self-pay | Admitting: *Deleted

## 2018-01-02 MED ORDER — FLUTICASONE PROPIONATE 50 MCG/ACT NA SUSP: 2.0000 | Freq: Every day | NASAL | 0 refills | Status: DC

## 2018-01-02 NOTE — Telephone Encounter (Signed)
Copied from [place] [phone]. Topic: General - Other >> Jan 02, 2018  1:14 PM Darl Householder, RMA wrote: Reason for CRM: Medication refill request for fluticasone (FLONASE) 50 MCG/ACT nasal spray to be sent to [facility]

## 2018-01-03 DIAGNOSIS — X32XXXA Exposure to sunlight, initial encounter: Secondary | ICD-10-CM | POA: Diagnosis not present

## 2018-01-03 DIAGNOSIS — D225 Melanocytic nevi of trunk: Secondary | ICD-10-CM | POA: Diagnosis not present

## 2018-01-03 DIAGNOSIS — D2261 Melanocytic nevi of right upper limb, including shoulder: Secondary | ICD-10-CM | POA: Diagnosis not present

## 2018-01-03 DIAGNOSIS — D2272 Melanocytic nevi of left lower limb, including hip: Secondary | ICD-10-CM | POA: Diagnosis not present

## 2018-01-03 DIAGNOSIS — Z85828 Personal history of other malignant neoplasm of skin: Secondary | ICD-10-CM | POA: Diagnosis not present

## 2018-01-03 DIAGNOSIS — L570 Actinic keratosis: Secondary | ICD-10-CM | POA: Diagnosis not present

## 2018-01-21 ENCOUNTER — Ambulatory Visit (INDEPENDENT_AMBULATORY_CARE_PROVIDER_SITE_OTHER): Payer: PPO

## 2018-01-21 DIAGNOSIS — E538 Deficiency of other specified B group vitamins: Secondary | ICD-10-CM

## 2018-01-21 MED ORDER — CYANOCOBALAMIN 1000 MCG/ML IJ SOLN
1000.0000 ug | Freq: Once | INTRAMUSCULAR | Status: AC
  Administered 2018-01-21: 1000 ug via INTRAMUSCULAR

## 2018-01-21 NOTE — Progress Notes (Addendum)
Patient presented today for B12 injection. Right deltoid. Patient voiced no concerns nor showed any signs of distress during injection.   Reviewed.  Dr Nicki Reaper

## 2018-01-31 ENCOUNTER — Other Ambulatory Visit: Payer: Self-pay | Admitting: Internal Medicine

## 2018-02-25 ENCOUNTER — Ambulatory Visit (INDEPENDENT_AMBULATORY_CARE_PROVIDER_SITE_OTHER): Payer: PPO | Admitting: *Deleted

## 2018-02-25 DIAGNOSIS — E538 Deficiency of other specified B group vitamins: Secondary | ICD-10-CM | POA: Diagnosis not present

## 2018-02-25 MED ORDER — CYANOCOBALAMIN 1000 MCG/ML IJ SOLN
1000.0000 ug | Freq: Once | INTRAMUSCULAR | Status: AC
  Administered 2018-02-25: 1000 ug via INTRAMUSCULAR

## 2018-02-25 NOTE — Progress Notes (Signed)
Patient presented for B 12 injection to left deltoid, patient voiced no concerns nor showed any signs of distress during injection. 

## 2018-02-26 ENCOUNTER — Other Ambulatory Visit: Payer: Self-pay | Admitting: Internal Medicine

## 2018-03-19 ENCOUNTER — Encounter: Payer: Self-pay | Admitting: Internal Medicine

## 2018-03-19 ENCOUNTER — Ambulatory Visit (INDEPENDENT_AMBULATORY_CARE_PROVIDER_SITE_OTHER): Payer: PPO | Admitting: Internal Medicine

## 2018-03-19 VITALS — BP 118/66 | HR 70 | Temp 98.1°F | Resp 14 | Ht 62.0 in | Wt 134.2 lb

## 2018-03-19 DIAGNOSIS — D508 Other iron deficiency anemias: Secondary | ICD-10-CM | POA: Diagnosis not present

## 2018-03-19 DIAGNOSIS — Z1231 Encounter for screening mammogram for malignant neoplasm of breast: Secondary | ICD-10-CM

## 2018-03-19 DIAGNOSIS — E559 Vitamin D deficiency, unspecified: Secondary | ICD-10-CM | POA: Diagnosis not present

## 2018-03-19 DIAGNOSIS — R5383 Other fatigue: Secondary | ICD-10-CM | POA: Diagnosis not present

## 2018-03-19 DIAGNOSIS — N393 Stress incontinence (female) (male): Secondary | ICD-10-CM

## 2018-03-19 DIAGNOSIS — Z1239 Encounter for other screening for malignant neoplasm of breast: Secondary | ICD-10-CM

## 2018-03-19 DIAGNOSIS — Z79899 Other long term (current) drug therapy: Secondary | ICD-10-CM

## 2018-03-19 DIAGNOSIS — M5010 Cervical disc disorder with radiculopathy, unspecified cervical region: Secondary | ICD-10-CM | POA: Diagnosis not present

## 2018-03-19 DIAGNOSIS — K219 Gastro-esophageal reflux disease without esophagitis: Secondary | ICD-10-CM

## 2018-03-19 DIAGNOSIS — Z0000 Encounter for general adult medical examination without abnormal findings: Secondary | ICD-10-CM | POA: Diagnosis not present

## 2018-03-19 DIAGNOSIS — B351 Tinea unguium: Secondary | ICD-10-CM

## 2018-03-19 DIAGNOSIS — E611 Iron deficiency: Secondary | ICD-10-CM

## 2018-03-19 DIAGNOSIS — E785 Hyperlipidemia, unspecified: Secondary | ICD-10-CM

## 2018-03-19 LAB — COMPREHENSIVE METABOLIC PANEL
ALT: 9 U/L (ref 0–35)
AST: 13 U/L (ref 0–37)
Albumin: 3.8 g/dL (ref 3.5–5.2)
Alkaline Phosphatase: 65 U/L (ref 39–117)
BUN: 9 mg/dL (ref 6–23)
CO2: 30 mEq/L (ref 19–32)
Calcium: 9 mg/dL (ref 8.4–10.5)
Chloride: 104 mEq/L (ref 96–112)
Creatinine, Ser: 0.85 mg/dL (ref 0.40–1.20)
GFR: 70.1 mL/min (ref >60.00)
Glucose, Bld: 93 mg/dL (ref 70–99)
Potassium: 4 mEq/L (ref 3.5–5.1)
Sodium: 142 mEq/L (ref 135–145)
Total Bilirubin: 0.4 mg/dL (ref 0.2–1.2)
Total Protein: 6.3 g/dL (ref 6.0–8.3)

## 2018-03-19 LAB — CBC WITH DIFFERENTIAL/PLATELET
Basophils Absolute: 0 10*3/uL (ref 0.0–0.1)
Basophils Relative: 0.4 % (ref 0.0–3.0)
Eosinophils Absolute: 0.1 10*3/uL (ref 0.0–0.7)
Eosinophils Relative: 3.2 % (ref 0.0–5.0)
HCT: 37.7 % (ref 36.0–46.0)
Hemoglobin: 12.6 g/dL (ref 12.0–15.0)
Lymphocytes Relative: 26.1 % (ref 12.0–46.0)
Lymphs Abs: 1.1 10*3/uL (ref 0.7–4.0)
MCHC: 33.6 g/dL (ref 30.0–36.0)
MCV: 86.4 fl (ref 78.0–100.0)
Monocytes Absolute: 0.3 10*3/uL (ref 0.1–1.0)
Monocytes Relative: 7.7 % (ref 3.0–12.0)
Neutro Abs: 2.7 10*3/uL (ref 1.4–7.7)
Neutrophils Relative %: 62.6 % (ref 43.0–77.0)
Platelets: 207 10*3/uL (ref 150.0–400.0)
RBC: 4.36 Mil/uL (ref 3.87–5.11)
RDW: 14.5 % (ref 11.5–15.5)
WBC: 4.3 10*3/uL (ref 4.0–10.5)

## 2018-03-19 LAB — URINALYSIS, ROUTINE W REFLEX MICROSCOPIC
Bilirubin Urine: NEGATIVE
Hgb urine dipstick: NEGATIVE
Ketones, ur: NEGATIVE
Leukocytes, UA: NEGATIVE
Nitrite: NEGATIVE
RBC / HPF: NONE SEEN (ref 0–?)
Specific Gravity, Urine: 1.015 (ref 1.000–1.030)
Total Protein, Urine: NEGATIVE
Urine Glucose: NEGATIVE
Urobilinogen, UA: 0.2 (ref 0.0–1.0)
pH: 8 (ref 5.0–8.0)

## 2018-03-19 LAB — LIPID PANEL
Cholesterol: 201 mg/dL — ABNORMAL HIGH (ref 0–200)
HDL: 61.9 mg/dL (ref >39.00)
LDL Cholesterol: 119 mg/dL — ABNORMAL HIGH (ref 0–99)
NonHDL: 138.6
Total CHOL/HDL Ratio: 3
Triglycerides: 97 mg/dL (ref 0.0–149.0)
VLDL: 19.4 mg/dL (ref 0.0–40.0)

## 2018-03-19 LAB — VITAMIN D 25 HYDROXY (VIT D DEFICIENCY, FRACTURES): VITD: 52.46 ng/mL (ref 30.00–100.00)

## 2018-03-19 LAB — IRON,TIBC AND FERRITIN PANEL
%SAT: 14 % (calc) (ref 11–50)
Ferritin: 23 ng/mL (ref 20–288)
Iron: 47 ug/dL (ref 45–160)
TIBC: 348 mcg/dL (calc) (ref 250–450)

## 2018-03-19 LAB — TSH: TSH: 2.36 u[IU]/mL (ref 0.35–4.50)

## 2018-03-19 MED ORDER — TERBINAFINE HCL 250 MG PO TABS: 250.0000 mg | ORAL_TABLET | Freq: Every day | ORAL | 2 refills | Status: DC

## 2018-03-19 NOTE — Patient Instructions (Signed)
I am starting you on terbinafine for your toenail fungus.  Pleaser return in 3-4 weeks for a nonfasting lab appt to check liver enzymes     Health Maintenance for Postmenopausal Women Menopause is a normal process in which your reproductive ability comes to an end. This process happens gradually over a span of months to years, usually between the ages of 30 and 39. Menopause is complete when you have missed 12 consecutive menstrual periods. It is important to talk with your health care provider about some of the most common conditions that affect postmenopausal women, such as heart disease, cancer, and bone loss (osteoporosis). Adopting a healthy lifestyle and getting preventive care can help to promote your health and wellness. Those actions can also lower your chances of developing some of these common conditions. What should I know about menopause? During menopause, you may experience a number of symptoms, such as:  Moderate-to-severe hot flashes.  Night sweats.  Decrease in sex drive.  Mood swings.  Headaches.  Tiredness.  Irritability.  Memory problems.  Insomnia.  Choosing to treat or not to treat menopausal changes is an individual decision that you make with your health care provider. What should I know about hormone replacement therapy and supplements? Hormone therapy products are effective for treating symptoms that are associated with menopause, such as hot flashes and night sweats. Hormone replacement carries certain risks, especially as you become older. If you are thinking about using estrogen or estrogen with progestin treatments, discuss the benefits and risks with your health care provider. What should I know about heart disease and stroke? Heart disease, heart attack, and stroke become more likely as you age. This may be due, in part, to the hormonal changes that your body experiences during menopause. These can affect how your body processes dietary fats,  triglycerides, and cholesterol. Heart attack and stroke are both medical emergencies. There are many things that you can do to help prevent heart disease and stroke:  Have your blood pressure checked at least every 1-2 years. High blood pressure causes heart disease and increases the risk of stroke.  If you are 4-56 years old, ask your health care provider if you should take aspirin to prevent a heart attack or a stroke.  Do not use any tobacco products, including cigarettes, chewing tobacco, or electronic cigarettes. If you need help quitting, ask your health care provider.  It is important to eat a healthy diet and maintain a healthy weight. ? Be sure to include plenty of vegetables, fruits, low-fat dairy products, and lean protein. ? Avoid eating foods that are high in solid fats, added sugars, or salt (sodium).  Get regular exercise. This is one of the most important things that you can do for your health. ? Try to exercise for at least 150 minutes each week. The type of exercise that you do should increase your heart rate and make you sweat. This is known as moderate-intensity exercise. ? Try to do strengthening exercises at least twice each week. Do these in addition to the moderate-intensity exercise.  Know your numbers.Ask your health care provider to check your cholesterol and your blood glucose. Continue to have your blood tested as directed by your health care provider.  What should I know about cancer screening? There are several types of cancer. Take the following steps to reduce your risk and to catch any cancer development as early as possible. Breast Cancer  Practice breast self-awareness. ? This means understanding how your breasts normally  appear and feel. ? It also means doing regular breast self-exams. Let your health care provider know about any changes, no matter how small.  If you are 60 or older, have a clinician do a breast exam (clinical breast exam or CBE)  every year. Depending on your age, family history, and medical history, it may be recommended that you also have a yearly breast X-ray (mammogram).  If you have a family history of breast cancer, talk with your health care provider about genetic screening.  If you are at high risk for breast cancer, talk with your health care provider about having an MRI and a mammogram every year.  Breast cancer (BRCA) gene test is recommended for women who have family members with BRCA-related cancers. Results of the assessment will determine the need for genetic counseling and BRCA1 and for BRCA2 testing. BRCA-related cancers include these types: ? Breast. This occurs in males or females. ? Ovarian. ? Tubal. This may also be called fallopian tube cancer. ? Cancer of the abdominal or pelvic lining (peritoneal cancer). ? Prostate. ? Pancreatic.  Cervical, Uterine, and Ovarian Cancer Your health care provider may recommend that you be screened regularly for cancer of the pelvic organs. These include your ovaries, uterus, and vagina. This screening involves a pelvic exam, which includes checking for microscopic changes to the surface of your cervix (Pap test).  For women ages 21-65, health care providers may recommend a pelvic exam and a Pap test every three years. For women ages 21-65, they may recommend the Pap test and pelvic exam, combined with testing for human papilloma virus (HPV), every five years. Some types of HPV increase your risk of cervical cancer. Testing for HPV may also be done on women of any age who have unclear Pap test results.  Other health care providers may not recommend any screening for nonpregnant women who are considered low risk for pelvic cancer and have no symptoms. Ask your health care provider if a screening pelvic exam is right for you.  If you have had past treatment for cervical cancer or a condition that could lead to cancer, you need Pap tests and screening for cancer for at  least 20 years after your treatment. If Pap tests have been discontinued for you, your risk factors (such as having a new sexual partner) need to be reassessed to determine if you should start having screenings again. Some women have medical problems that increase the chance of getting cervical cancer. In these cases, your health care provider may recommend that you have screening and Pap tests more often.  If you have a family history of uterine cancer or ovarian cancer, talk with your health care provider about genetic screening.  If you have vaginal bleeding after reaching menopause, tell your health care provider.  There are currently no reliable tests available to screen for ovarian cancer.  Lung Cancer Lung cancer screening is recommended for adults 34-2 years old who are at high risk for lung cancer because of a history of smoking. A yearly low-dose CT scan of the lungs is recommended if you:  Currently smoke.  Have a history of at least 30 pack-years of smoking and you currently smoke or have quit within the past 15 years. A pack-year is smoking an average of one pack of cigarettes per day for one year.  Yearly screening should:  Continue until it has been 15 years since you quit.  Stop if you develop a health problem that would prevent you  from having lung cancer treatment.  Colorectal Cancer  This type of cancer can be detected and can often be prevented.  Routine colorectal cancer screening usually begins at age 52 and continues through age 50.  If you have risk factors for colon cancer, your health care provider may recommend that you be screened at an earlier age.  If you have a family history of colorectal cancer, talk with your health care provider about genetic screening.  Your health care provider may also recommend using home test kits to check for hidden blood in your stool.  A small camera at the end of a tube can be used to examine your colon directly  (sigmoidoscopy or colonoscopy). This is done to check for the earliest forms of colorectal cancer.  Direct examination of the colon should be repeated every 5-10 years until age 50. However, if early forms of precancerous polyps or small growths are found or if you have a family history or genetic risk for colorectal cancer, you may need to be screened more often.  Skin Cancer  Check your skin from head to toe regularly.  Monitor any moles. Be sure to tell your health care provider: ? About any new moles or changes in moles, especially if there is a change in a mole's shape or color. ? If you have a mole that is larger than the size of a pencil eraser.  If any of your family members has a history of skin cancer, especially at a young age, talk with your health care provider about genetic screening.  Always use sunscreen. Apply sunscreen liberally and repeatedly throughout the day.  Whenever you are outside, protect yourself by wearing long sleeves, pants, a wide-brimmed hat, and sunglasses.  What should I know about osteoporosis? Osteoporosis is a condition in which bone destruction happens more quickly than new bone creation. After menopause, you may be at an increased risk for osteoporosis. To help prevent osteoporosis or the bone fractures that can happen because of osteoporosis, the following is recommended:  If you are 3-44 years old, get at least 1,000 mg of calcium and at least 600 mg of vitamin D per day.  If you are older than age 35 but younger than age 36, get at least 1,200 mg of calcium and at least 600 mg of vitamin D per day.  If you are older than age 76, get at least 1,200 mg of calcium and at least 800 mg of vitamin D per day.  Smoking and excessive alcohol intake increase the risk of osteoporosis. Eat foods that are rich in calcium and vitamin D, and do weight-bearing exercises several times each week as directed by your health care provider. What should I know about  how menopause affects my mental health? Depression may occur at any age, but it is more common as you become older. Common symptoms of depression include:  Low or sad mood.  Changes in sleep patterns.  Changes in appetite or eating patterns.  Feeling an overall lack of motivation or enjoyment of activities that you previously enjoyed.  Frequent crying spells.  Talk with your health care provider if you think that you are experiencing depression. What should I know about immunizations? It is important that you get and maintain your immunizations. These include:  Tetanus, diphtheria, and pertussis (Tdap) booster vaccine.  Influenza every year before the flu season begins.  Pneumonia vaccine.  Shingles vaccine.  Your health care provider may also recommend other immunizations. This information is  not intended to replace advice given to you by your health care provider. Make sure you discuss any questions you have with your health care provider. Document Released: 12/21/2005 Document Revised: 05/18/2016 Document Reviewed: 08/02/2015 Elsevier Interactive Patient Education  2018 [employer].

## 2018-03-19 NOTE — Progress Notes (Signed)
Patient ID: SINIYA Hamilton, female    DOB: 1946/08/14  Age: 72 y.o. MRN: [id number]  The patient is here for annual preventive examination and management of other chronic and acute problems.  Mammogram due in July     The risk factors are reflected in the social history.  The roster of all physicians providing medical care to patient - is listed in the Snapshot section of the chart.  Activities of daily living:  The patient is 100% independent in all ADLs: dressing, toileting, feeding as well as independent mobility  Home safety : The patient has smoke detectors in the home. They wear seatbelts.  There are no firearms at home. There is no violence in the home.   There is no risks for hepatitis, STDs or HIV. There is no   history of blood transfusion. They have no travel history to infectious disease endemic areas of the world.  The patient has seen their dentist in the last six month. They have seen their eye doctor in the last year. They admit to slight hearing difficulty with regard to whispered voices and some television programs.  They have deferred audiologic testing in the last year.  They do not  have excessive sun exposure. Discussed the need for sun protection: hats, long sleeves and use of sunscreen if there is significant sun exposure.   Diet: the importance of a healthy diet is discussed. They do have a healthy diet.  The benefits of regular aerobic exercise were discussed. She walks 4 times per week ,  20 minutes.   Depression screen: there are no signs or vegative symptoms of depression- irritability, change in appetite, anhedonia, sadness/tearfullness.  Cognitive assessment: the patient manages all their financial and personal affairs and is actively engaged. They could relate day,date,year and events; recalled 2/3 objects at 3 minutes; performed clock-face test normally.  The following portions of the patient's history were reviewed and updated as appropriate: allergies,  current medications, past family history, past medical history,  past surgical history, past social history  and problem list.  Visual acuity was not assessed per patient preference since she has regular follow up with her ophthalmologist. Hearing and body mass index were assessed and reviewed.   During the course of the visit the patient was educated and counseled about appropriate screening and preventive services including : fall prevention , diabetes screening, nutrition counseling, colorectal cancer screening, and recommended immunizations.    CC: The primary encounter diagnosis was Breast cancer screening. Diagnoses of Hyperlipidemia LDL goal <160, Iron deficiency, Long-term use of high-risk medication, Fatigue, unspecified type, Stress incontinence, Vitamin D deficiency, Gastroesophageal reflux disease without esophagitis, Other iron deficiency anemia, Cervical disc disorder with radiculopathy of cervical region, Onychomycosis, and Visit for preventive health examination were also pertinent to this visit.  Reflux cough has improved ,   Stress incontinence still an issue .  Uses a pad,    Toenail fungus  Neck pain and shoulder pain still bothersome ,  Aggravated by carrying heavy plants back and forth at the [place] has a past medical history of Allergy, Anemia, Anxiety, Arthritis, Cancer ([place]), Cough, Depression, GERD (gastroesophageal reflux disease), and Hyperlipidemia.   She has a past surgical history that includes Back surgery (1980); Cataract extraction w/PHACO (Right, 11/28/2015); Colonoscopy; Cataract extraction w/PHACO (Left, 12/19/2015); Colonoscopy with propofol (N/A, 10/10/2017); and polypectomy (10/10/2017).   Her family history includes Breast cancer (age of onset: 36) in her maternal aunt; Diabetes in her brother  and paternal grandmother; Fibromyalgia in her sister; Heart disease in her brother; Hyperlipidemia in her sister; Hypertension in her  brother.She reports that she quit smoking about 25 years ago. She has never used smokeless tobacco. She reports that she does not drink alcohol or use drugs.  Outpatient Medications Prior to Visit  Medication Sig Dispense Refill  . Calcium Carbonate-Vit D-Min (CALCIUM 1200 PO) Take 1 tablet by mouth daily.    . Cholecalciferol (HM VITAMIN D3) 2000 UNITS CAPS Take 1 capsule by mouth daily.    . Coenzyme Q10 (CO Q 10 PO) Take 200 mg by mouth daily. Reported on 12/19/2015    . cyanocobalamin (,VITAMIN B-12,) 1000 MCG/ML injection Inject 1 ml intrumuscularly daily x 3, then weekly x 4, then monthly thereafter 10 mL 2  . docusate sodium (COLACE) 100 MG capsule TAKE 1 CAPSULE BY MOUTH TWICE A DAY 180 capsule 1  . ferrous sulfate 325 (65 FE) MG tablet take 1 tablet by mouth once daily with BREAKFAST 90 tablet 0  . FLUoxetine (PROZAC) 20 MG capsule Take 1 capsule (20 mg total) by mouth daily. 90 capsule 1  . fluticasone (FLONASE) 50 MCG/ACT nasal spray INSTILL 2 SPRAYS INTO BOTH NOSTRIL EVERY DAY 16 g 1  . levocetirizine (XYZAL) 5 MG tablet TAKE 1 TABLET BY MOUTH EVERY EVENING 90 tablet 1  . meclizine (ANTIVERT) 12.5 MG tablet Take 1 tablet (12.5 mg total) by mouth 3 (three) times daily as needed for dizziness. 30 tablet 0  . Misc Natural Products (OSTEO BI-FLEX JOINT SHIELD PO) Take 1 tablet by mouth daily.    . montelukast (SINGULAIR) 10 MG tablet TAKE 1 TABLET BY MOUTH AT BEDTIME 90 tablet 1  . pantoprazole (PROTONIX) 40 MG tablet Take 1 tablet (40 mg total) by mouth daily. 90 tablet 1  . sodium chloride (OCEAN) 0.65 % nasal spray Place 2 sprays into the nose as needed for congestion.    . Syringe, Disposable, 1 ML MISC For use with B12 injections 25 each 1  . triamcinolone cream (KENALOG) 0.1 % Apply 1 application topically 2 (two) times daily as needed. Reported on 12/19/2015  0   No facility-administered medications prior to visit.     Review of Systems   Patient denies headache, fevers, malaise,  unintentional weight loss, skin rash, eye pain, sinus congestion and sinus pain, sore throat, dysphagia,  hemoptysis , cough, dyspnea, wheezing, chest pain, palpitations, orthopnea, edema, abdominal pain, nausea, melena, diarrhea, constipation, flank pain, dysuria, hematuria, urinary  Frequency, nocturia, numbness, tingling, seizures,  Focal weakness, Loss of consciousness,  Tremor, insomnia, depression, anxiety, and suicidal ideation.      Objective:  BP 118/66 (BP Location: Left Arm, Patient Position: Sitting, Cuff Size: Normal)   Pulse 70   Temp 98.1 F (36.7 C) (Oral)   Resp 14   Ht 5\' 2"  (1.575 m)   Wt 134 lb 3.2 oz (60.9 kg)   SpO2 96%   BMI 24.55 kg/m   Physical Exam   General appearance: alert, cooperative and appears stated age Head: Normocephalic, without obvious abnormality, atraumatic Eyes: conjunctivae/corneas clear. PERRL, EOM's intact. Fundi benign. Ears: normal TM's and external ear canals both ears Nose: Nares normal. Septum midline. Mucosa normal. No drainage or sinus tenderness. Throat: lips, mucosa, and tongue normal; teeth and gums normal Neck: no adenopathy, no carotid bruit, no JVD, supple, symmetrical, trachea midline and thyroid not enlarged, symmetric, no tenderness/mass/nodules Lungs: clear to auscultation bilaterally Breasts: normal appearance, no masses or tenderness Heart: regular  rate and rhythm, S1, S2 normal, no murmur, click, rub or gallop Abdomen: soft, non-tender; bowel sounds normal; no masses,  no organomegaly Extremities: extremities normal, atraumatic, no cyanosis or edema Pulses: 2+ and symmetric Skin: Skin color, texture, turgor normal. No rashes or lesions Neurologic: Alert and oriented X 3, normal strength and tone. Normal symmetric reflexes. Normal coordination and gait.   Ext: bilateral thickened discolored great toe nails c/w onychomycosis     Assessment & Plan:   Problem List Items Addressed This Visit    Iron deficiency    Relevant Orders   Iron, TIBC and Ferritin Panel (Completed)   Visit for preventive health examination - Primary    Annual comprehensive preventive exam was done as well as an evaluation and management of chronic conditions .  During the course of the visit the patient was educated and counseled about appropriate screening and preventive services including :  diabetes screening, lipid analysis with projected  10 year  risk for CAD , nutrition counseling, breast, cervical and colorectal cancer screening, and recommended immunizations.  Printed recommendations for health maintenance screenings was give      Stress incontinence    UA done to rule out causative infection.  Kegel exercises described and advised.       Relevant Orders   Urinalysis, Routine w reflex microscopic (Completed)   Onychomycosis    3 month trial of terbinafine offered.  Return for liver enzymes after 3-4 weeks       Relevant Medications   terbinafine (LAMISIL) 250 MG tablet   Hyperlipidemia LDL goal <160    Mild, untreated due to statin intolerance .  10 yr risk of CAD is 7% using FRC   Lab Results  Component Value Date   CHOL 201 (H) 03/19/2018   HDL 61.90 03/19/2018   LDLCALC 119 (H) 03/19/2018   TRIG 97.0 03/19/2018   CHOLHDL 3 03/19/2018           Relevant Orders   Lipid panel (Completed)   GERD (gastroesophageal reflux disease)    Noted on prior EGD, with hiatal hernia also present.  No eosinophilic esophagitis by 1497 path report .  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         Anemia    Resolved with iron supplementation.  She is up to date on GI screening (last scope 2018  ,  5 yr follow up )  Lab Results  Component Value Date   WBC 4.3 03/19/2018   HGB 12.6 03/19/2018   HCT 37.7 03/19/2018   MCV 86.4 03/19/2018   PLT 207.0 03/19/2018   Lab Results  Component Value Date   IRON 47 03/19/2018   TIBC 348 03/19/2018   FERRITIN 23 03/19/2018          Other Visit Diagnoses    Long-term use of high-risk medication       Fatigue, unspecified type       Relevant Orders   Comprehensive metabolic panel (Completed)   CBC with Differential/Platelet (Completed)  TSH (Completed)   Vitamin D deficiency       Relevant Orders   VITAMIN D 25 Hydroxy (Vit-D Deficiency, Fractures) (Completed)      I am having Nancy Hamilton start on terbinafine. I am also having her maintain her Coenzyme Q10 (CO Q 10 PO), sodium chloride, Cholecalciferol, Misc Natural Products (OSTEO BI-FLEX JOINT SHIELD PO), cyanocobalamin, Syringe (Disposable), triamcinolone cream, Calcium Carbonate-Vit D-Min (CALCIUM 1200 PO), meclizine, FLUoxetine, ferrous sulfate, pantoprazole, fluticasone, levocetirizine, montelukast, and docusate sodium.  Meds ordered this encounter  Medications  . terbinafine (LAMISIL) 250 MG tablet    Sig: Take 1 tablet (250 mg total) by mouth daily.    Dispense:  30 tablet    Refill:  2    There are no discontinued medications.  Follow-up: Return in about 1 year (around 03/20/2019) for cmet  in 3-4 weeks .   Crecencio Mc, MD

## 2018-03-22 DIAGNOSIS — N393 Stress incontinence (female) (male): Secondary | ICD-10-CM | POA: Insufficient documentation

## 2018-03-22 DIAGNOSIS — B351 Tinea unguium: Secondary | ICD-10-CM | POA: Insufficient documentation

## 2018-03-22 HISTORY — DX: Tinea unguium: B35.1

## 2018-03-22 NOTE — Assessment & Plan Note (Signed)
Resolved with iron supplementation.  She is up to date on GI screening (last scope 2018  ,  5 yr follow up )  Lab Results  Component Value Date   WBC 4.3 03/19/2018   HGB 12.6 03/19/2018   HCT 37.7 03/19/2018   MCV 86.4 03/19/2018   PLT 207.0 03/19/2018   Lab Results  Component Value Date   IRON 47 03/19/2018   TIBC 348 03/19/2018   FERRITIN 23 03/19/2018

## 2018-03-22 NOTE — Assessment & Plan Note (Signed)
3 month trial of terbinafine offered.  Return for liver enzymes after 3-4 weeks

## 2018-03-22 NOTE — Assessment & Plan Note (Addendum)
Confirmed by cervical spine MRI  With DJD resulting in large osteophyte formations resulting in spinal stenosis at level C5 and below .  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

## 2018-03-22 NOTE — Assessment & Plan Note (Signed)
Annual comprehensive preventive exam was done as well as an evaluation and management of chronic conditions .  During the course of the visit the patient was educated and counseled about appropriate screening and preventive services including :  diabetes screening, lipid analysis with projected  10 year  risk for CAD , nutrition counseling, breast, cervical and colorectal cancer screening, and recommended immunizations.  Printed recommendations for health maintenance screenings was give 

## 2018-03-22 NOTE — Assessment & Plan Note (Signed)
Mild, untreated due to statin intolerance .  10 yr risk of CAD is 7% using FRC   Lab Results  Component Value Date   CHOL 201 (H) 03/19/2018   HDL 61.90 03/19/2018   LDLCALC 119 (H) 03/19/2018   TRIG 97.0 03/19/2018   CHOLHDL 3 03/19/2018

## 2018-03-22 NOTE — Assessment & Plan Note (Signed)
UA done to rule out causative infection.  Kegel exercises described and advised.

## 2018-03-22 NOTE — Assessment & Plan Note (Signed)
Noted on prior EGD, with hiatal hernia also present.  No eosinophilic esophagitis by 8948 path report .  Continue daily PPI to control cough

## 2018-04-01 ENCOUNTER — Ambulatory Visit (INDEPENDENT_AMBULATORY_CARE_PROVIDER_SITE_OTHER): Payer: PPO | Admitting: *Deleted

## 2018-04-01 DIAGNOSIS — E538 Deficiency of other specified B group vitamins: Secondary | ICD-10-CM

## 2018-04-01 MED ORDER — CYANOCOBALAMIN 1000 MCG/ML IJ SOLN
1000.0000 ug | Freq: Once | INTRAMUSCULAR | Status: AC
  Administered 2018-04-01: 1000 ug via INTRAMUSCULAR

## 2018-04-01 NOTE — Progress Notes (Signed)
Patient presented for B 12 injection to left deltoid, patient voiced no concerns nor showed any signs of distress during injection. 

## 2018-04-14 ENCOUNTER — Telehealth: Payer: Self-pay | Admitting: Radiology

## 2018-04-14 DIAGNOSIS — Z79899 Other long term (current) drug therapy: Secondary | ICD-10-CM

## 2018-04-14 NOTE — Telephone Encounter (Signed)
cmet ordered 

## 2018-04-14 NOTE — Addendum Note (Signed)
Addended by: Crecencio Mc on: 04/14/2018 08:47 AM   Modules accepted: Orders

## 2018-04-14 NOTE — Telephone Encounter (Signed)
Pt coming in for labs tomorrow, please place future orders. Thank you.  

## 2018-04-15 ENCOUNTER — Other Ambulatory Visit (INDEPENDENT_AMBULATORY_CARE_PROVIDER_SITE_OTHER): Payer: PPO

## 2018-04-15 DIAGNOSIS — Z79899 Other long term (current) drug therapy: Secondary | ICD-10-CM

## 2018-04-15 LAB — COMPREHENSIVE METABOLIC PANEL
ALT: 10 U/L (ref 0–35)
AST: 12 U/L (ref 0–37)
Albumin: 3.9 g/dL (ref 3.5–5.2)
Alkaline Phosphatase: 77 U/L (ref 39–117)
BUN: 15 mg/dL (ref 6–23)
CO2: 31 mEq/L (ref 19–32)
Calcium: 9 mg/dL (ref 8.4–10.5)
Chloride: 103 mEq/L (ref 96–112)
Creatinine, Ser: 0.89 mg/dL (ref 0.40–1.20)
GFR: 66.46 mL/min (ref >60.00)
Glucose, Bld: 102 mg/dL — ABNORMAL HIGH (ref 70–99)
Potassium: 4.3 mEq/L (ref 3.5–5.1)
Sodium: 141 mEq/L (ref 135–145)
Total Bilirubin: 0.4 mg/dL (ref 0.2–1.2)
Total Protein: 6.5 g/dL (ref 6.0–8.3)

## 2018-05-06 ENCOUNTER — Ambulatory Visit (INDEPENDENT_AMBULATORY_CARE_PROVIDER_SITE_OTHER): Payer: PPO | Admitting: *Deleted

## 2018-05-06 DIAGNOSIS — E538 Deficiency of other specified B group vitamins: Secondary | ICD-10-CM

## 2018-05-06 MED ORDER — CYANOCOBALAMIN 1000 MCG/ML IJ SOLN
1000.0000 ug | Freq: Once | INTRAMUSCULAR | Status: AC
  Administered 2018-05-06: 1000 ug via INTRAMUSCULAR

## 2018-05-06 NOTE — Progress Notes (Signed)
Patient presented for B 12 injection to left deltoid, patient voiced no concerns nor showed any signs of distress during injection. 

## 2018-05-09 ENCOUNTER — Ambulatory Visit: Payer: PPO

## 2018-05-26 ENCOUNTER — Ambulatory Visit
Admission: RE | Admit: 2018-05-26 | Discharge: 2018-05-26 | Disposition: A | Discharge status: Home / Self Care | Payer: PPO | Source: Ambulatory Visit | Attending: Internal Medicine | Admitting: Internal Medicine

## 2018-05-26 DIAGNOSIS — Z1231 Encounter for screening mammogram for malignant neoplasm of breast: Secondary | ICD-10-CM | POA: Insufficient documentation

## 2018-05-26 DIAGNOSIS — Z1239 Encounter for other screening for malignant neoplasm of breast: Secondary | ICD-10-CM

## 2018-06-10 ENCOUNTER — Ambulatory Visit (INDEPENDENT_AMBULATORY_CARE_PROVIDER_SITE_OTHER): Payer: PPO | Admitting: *Deleted

## 2018-06-10 DIAGNOSIS — E538 Deficiency of other specified B group vitamins: Secondary | ICD-10-CM | POA: Diagnosis not present

## 2018-06-10 MED ORDER — CYANOCOBALAMIN 1000 MCG/ML IJ SOLN
1000.0000 ug | Freq: Once | INTRAMUSCULAR | Status: AC
  Administered 2018-06-10: 1000 ug via INTRAMUSCULAR

## 2018-06-10 NOTE — Progress Notes (Signed)
Patient presented for B 12 injection to right deltoid, patient voiced no concerns nor showed any signs of distress during injection. 

## 2018-06-17 ENCOUNTER — Other Ambulatory Visit: Payer: Self-pay

## 2018-06-17 MED ORDER — PANTOPRAZOLE SODIUM 40 MG PO TBEC: 40.0000 mg | DELAYED_RELEASE_TABLET | Freq: Every day | ORAL | 1 refills | Status: DC

## 2018-06-17 MED ORDER — FLUOXETINE HCL 20 MG PO CAPS: 20.0000 mg | ORAL_CAPSULE | Freq: Every day | ORAL | 1 refills | Status: DC

## 2018-06-26 ENCOUNTER — Ambulatory Visit (INDEPENDENT_AMBULATORY_CARE_PROVIDER_SITE_OTHER): Payer: PPO

## 2018-06-26 VITALS — BP 122/62 | HR 65 | Temp 98.2°F | Resp 14 | Ht 62.0 in | Wt 134.8 lb

## 2018-06-26 DIAGNOSIS — Z0000 Encounter for general adult medical examination without abnormal findings: Secondary | ICD-10-CM

## 2018-06-26 DIAGNOSIS — E538 Deficiency of other specified B group vitamins: Secondary | ICD-10-CM | POA: Diagnosis not present

## 2018-06-26 NOTE — Progress Notes (Addendum)
Subjective:   Nancy Hamilton is a 72 y.o. female who presents for Medicare Annual (Subsequent) preventive examination.  Review of Systems:  No ROS.  Medicare Wellness Visit. Additional risk factors are reflected in the social history. Cardiac Risk Factors include: advanced age (>38men, >30 women)     Objective:     Vitals: BP 122/62 (BP Location: Left Arm, Patient Position: Sitting, Cuff Size: Normal)   Pulse 65   Temp 98.2 F (36.8 C) (Oral)   Resp 14   Ht 5\' 2"  (1.575 m)   Wt 134 lb 12.8 oz (61.1 kg)   SpO2 94%   BMI 24.66 kg/m   Body mass index is 24.66 kg/m.  Advanced Directives 06/26/2018 10/10/2017 06/25/2017 12/19/2015 11/28/2015  Does Patient Have a Medical Advance Directive? Yes Yes No No No  Type of Paramedic of [place];Living will [place];Living will - - -  Does patient want to make changes to medical advance directive? No - Patient declined - Yes (MAU/Ambulatory/Procedural Areas - Information given) - -  Copy of [place] in Chart? No - copy requested No - copy requested - - -  Would patient like information on creating a medical advance directive? - - - - No - patient declined information    Tobacco Social History   Tobacco Use  Smoking Status Former Smoker  . Last attempt to quit: 08/10/1992  . Years since quitting: 25.8  Smokeless Tobacco Never Used     Counseling given: Not Answered   Clinical Intake:  Pre-visit preparation completed: Yes  Pain : No/denies pain     Nutritional Status: BMI of 19-24  Normal Diabetes: No  How often do you need to have someone help you when you read instructions, pamphlets, or other written materials from your doctor or pharmacy?: 1 - Never  Interpreter Needed?: No     Past Medical History:  Diagnosis Date  . Allergy   . Anemia   . Anxiety   . Arthritis    neck, no limitations  . Cancer ([place])    SKIN  . Cough    CHRONIC FROM REFLUX  .  Depression   . GERD (gastroesophageal reflux disease)    SLEEPS WITH HOB ELEVATED  . Hyperlipidemia    Past Surgical History:  Procedure Laterality Date  . BACK SURGERY  1980   Ruptured Disc  . CATARACT EXTRACTION W/PHACO Right 11/28/2015   Procedure: CATARACT EXTRACTION PHACO AND INTRAOCULAR LENS PLACEMENT (IOC);  Surgeon: Estill Cotta, MD;  Location: [facility];  Service: Ophthalmology;  Laterality: Right;  [place] AP% CDE fluid pack lot # [id number]  . CATARACT EXTRACTION W/PHACO Left 12/19/2015   Procedure: CATARACT EXTRACTION PHACO AND INTRAOCULAR LENS PLACEMENT (IOC);  Surgeon: Estill Cotta, MD;  Location: [facility];  Service: Ophthalmology;  Laterality: Left;  US:1:08.2 AP%:56.7% CDE:28.10 Lot# [id number]  . COLONOSCOPY    . COLONOSCOPY WITH PROPOFOL N/A 10/10/2017   Procedure: COLONOSCOPY WITH PROPOFOL;  Surgeon: Lucilla Lame, MD;  Location: [place];  Service: Endoscopy;  Laterality: N/A;  . POLYPECTOMY  10/10/2017   Procedure: POLYPECTOMY;  Surgeon: Lucilla Lame, MD;  Location: [place];  Service: Endoscopy;;   Family History  Problem Relation Age of Onset  . Hyperlipidemia Sister   . Fibromyalgia Sister   . Breast cancer Maternal Aunt 75  . Heart disease Brother   . Diabetes Brother   . Hypertension Brother   . Diabetes Paternal Grandmother  Social History   Socioeconomic History  . Marital status: Widowed    Spouse name: Not on file  . Number of children: Not on file  . Years of education: Not on file  . Highest education level: Not on file  Occupational History  . Not on file  Social Needs  . Financial resource strain: Not hard at all  . Food insecurity:    Worry: Never true    Inability: Never true  . Transportation needs:    Medical: No    Non-medical: No  Tobacco Use  . Smoking status: Former Smoker    Last attempt to quit: 08/10/1992    Years since quitting: 25.8  . Smokeless tobacco: Never Used  Substance and Sexual  Activity  . Alcohol use: No  . Drug use: No  . Sexual activity: Not Currently  Lifestyle  . Physical activity:    Days per week: Not on file    Minutes per session: Not on file  . Stress: Not at all  Relationships  . Social connections:    Talks on phone: Not on file    Gets together: Not on file    Attends religious service: Not on file    Active member of club or organization: Not on file    Attends meetings of clubs or organizations: Not on file    Relationship status: Not on file  Other Topics Concern  . Not on file  Social History Narrative   Marital status: widowed.      Living with: alone.      Employment: none.      Tobacco: former user.      Alcohol: none      Drugs: none      Exercise: none; she tends to the farm.     Outpatient Encounter Medications as of 06/26/2018  Medication Sig  . Calcium Carbonate-Vit D-Min (CALCIUM 1200 PO) Take 1 tablet by mouth daily.  . Cholecalciferol (HM VITAMIN D3) 2000 UNITS CAPS Take 1 capsule by mouth daily.  . Coenzyme Q10 (CO Q 10 PO) Take 200 mg by mouth daily. Reported on 12/19/2015  . cyanocobalamin (,VITAMIN B-12,) 1000 MCG/ML injection Inject 1 ml intrumuscularly daily x 3, then weekly x 4, then monthly thereafter  . docusate sodium (COLACE) 100 MG capsule TAKE 1 CAPSULE BY MOUTH TWICE A DAY  . ferrous sulfate 325 (65 FE) MG tablet take 1 tablet by mouth once daily with BREAKFAST  . FLUoxetine (PROZAC) 20 MG capsule Take 1 capsule (20 mg total) by mouth daily.  . fluticasone (FLONASE) 50 MCG/ACT nasal spray INSTILL 2 SPRAYS INTO BOTH NOSTRIL EVERY DAY  . levocetirizine (XYZAL) 5 MG tablet TAKE 1 TABLET BY MOUTH EVERY EVENING  . meclizine (ANTIVERT) 12.5 MG tablet Take 1 tablet (12.5 mg total) by mouth 3 (three) times daily as needed for dizziness.  . Misc Natural Products (OSTEO BI-FLEX JOINT SHIELD PO) Take 1 tablet by mouth daily.  . montelukast (SINGULAIR) 10 MG tablet TAKE 1 TABLET BY MOUTH AT BEDTIME  . pantoprazole  (PROTONIX) 40 MG tablet Take 1 tablet (40 mg total) by mouth daily.  . sodium chloride (OCEAN) 0.65 % nasal spray Place 2 sprays into the nose as needed for congestion.  . Syringe, Disposable, 1 ML MISC For use with B12 injections  . terbinafine (LAMISIL) 250 MG tablet Take 1 tablet (250 mg total) by mouth daily.  Marland Kitchen triamcinolone cream (KENALOG) 0.1 % Apply 1 application topically 2 (two) times daily as  needed. Reported on 12/19/2015   No facility-administered encounter medications on file as of 06/26/2018.     Activities of Daily Living In your present state of health, do you have any difficulty performing the following activities: 06/26/2018 10/10/2017  Hearing? N N  Vision? N N  Difficulty concentrating or making decisions? N N  Walking or climbing stairs? N N  Dressing or bathing? N N  Doing errands, shopping? N -  Preparing Food and eating ? N -  Using the Toilet? N -  In the past six months, have you accidently leaked urine? N -  Do you have problems with loss of bowel control? N -  Managing your Medications? N -  Managing your Finances? N -  Housekeeping or managing your Housekeeping? N -  Some recent data might be hidden    Patient Care Team: Crecencio Mc, MD as PCP - General (Internal Medicine)    Assessment:   This is a routine wellness examination for [place].  The goal of the wellness visit is to assist the patient how to close the gaps in care and create a preventative care plan for the patient.   The roster of all physicians providing medical care to patient is listed in the Snapshot section of the chart.  Taking calcium VIT D as appropriate/Osteoporosis risk reviewed.    Safety issues reviewed; Lives alone. Smoke and carbon monoxide detectors in the home. Firearms locked in a safe within the home. Wears seatbelts when driving or riding with others. No violence in the home.  They do not have excessive sun exposure.  Discussed the need for sun protection: hats,  long sleeves and the use of sunscreen if there is significant sun exposure.  Patient is alert, normal appearance, oriented to person/place/and time.  Correctly identified the president of the [place] and recalls of 3/3 words. Performs simple calculations and can read correct time from watch face.  Displays appropriate judgement.  No new identified risk were noted.  No failures at ADL's or IADL's.    BMI- discussed the importance of a healthy diet, water intake and the benefits of aerobic exercise. Educational material provided.   24 hour diet recall: Regular diet  Dental- every 6 months.  Sleep patterns- Sleeps 6-8 hours at night.  Wakes feeling rested.  Health maintenance gaps- closed.  Patient Concerns: Requests B12 level to be drawn at next visit in the office. Last level drawn more than 1 year ago. Future lab ordered prior to next B12 administration.   Exercise Activities and Dietary recommendations Current Exercise Habits: The patient does not participate in regular exercise at present  Goals    . DIET - INCREASE LEAN PROTEINS     Weight 127lb Low carb diet    . Increase physical activity     Walk for exercise        Fall Risk Fall Risk  06/26/2018 06/25/2017 03/11/2017 03/02/2015 02/25/2014  Falls in the past year? No Yes No No No  Number falls in past yr: - 1 - - -  Injury with Fall? - No - - -  Follow up - Falls prevention discussed;Education provided - - -   Depression Screen PHQ 2/9 Scores 06/26/2018 06/25/2017 03/11/2017 03/02/2015  PHQ - 2 Score 0 0 0 1  PHQ- 9 Score - 0 1 -     Cognitive Function MMSE - Mini Mental State Exam 06/26/2018  Orientation to time 5  Orientation to Place 5  Registration 3  Attention/  Calculation 5  Recall 3  Language- name 2 objects 2  Language- repeat 1  Language- follow 3 step command 3  Language- read & follow direction 1  Write a sentence 1  Copy design 1  Total score 30        Immunization History  Administered  Date(s) Administered  . Influenza Split 08/10/2012  . Influenza, High Dose Seasonal PF 09/14/2016, 09/03/2017  . Influenza,inj,Quad PF,6+ Mos 08/20/2013, 08/26/2014, 09/06/2015  . Pneumococcal Conjugate-13 02/25/2014  . Pneumococcal Polysaccharide-23 02/18/2013  . Tdap 12/01/2010  . Zoster 10/20/2015   Screening Tests Health Maintenance  Topic Date Due  . INFLUENZA VACCINE  06/12/2018  . MAMMOGRAM  05/27/2019  . TETANUS/TDAP  12/01/2020  . COLONOSCOPY  10/10/2022  . DEXA SCAN  Completed  . Hepatitis C Screening  Completed  . PNA vac Low Risk Adult  Completed      Plan:    End of life planning; Advance aging; Advanced directives discussed. Copy of current HCPOA/Living Will requested.    I have personally reviewed and noted the following in the patient's chart:   . Medical and social history . Use of alcohol, tobacco or illicit drugs  . Current medications and supplements . Functional ability and status . Nutritional status . Physical activity . Advanced directives . List of other physicians . Hospitalizations, surgeries, and ER visits in previous 12 months . Vitals . Screenings to include cognitive, depression, and falls . Referrals and appointments  In addition, I have reviewed and discussed with patient certain preventive protocols, quality metrics, and best practice recommendations. A written personalized care plan for preventive services as well as general preventive health recommendations were provided to patient.     OBrien-Blaney, Ardenia Stiner L, LPN  7/68/0881    I have reviewed the above information and agree with above.   Deborra Medina, MD

## 2018-06-26 NOTE — Patient Instructions (Addendum)
  Ms. Kahre , Thank you for taking time to come for your Medicare Wellness Visit. I appreciate your ongoing commitment to your health goals. Please review the following plan we discussed and let me know if I can assist you in the future.   Follow up as needed.    Bring a copy of your [place] and/or Living Will to be scanned into chart.  Have a great day! These are the goals we discussed: Goals    . DIET - INCREASE LEAN PROTEINS     Weight 127lb Low carb diet    . Increase physical activity     Walk for exercise        This is a list of the screening recommended for you and due dates:  Health Maintenance  Topic Date Due  . Flu Shot  06/12/2018  . Mammogram  05/27/2019  . Tetanus Vaccine  12/01/2020  . Colon Cancer Screening  10/10/2022  . DEXA scan (bone density measurement)  Completed  .  Hepatitis C: One time screening is recommended by Center for Disease Control  (CDC) for  adults born from [birth year] through 1964.   Completed  . Pneumonia vaccines  Completed

## 2018-07-15 ENCOUNTER — Ambulatory Visit (INDEPENDENT_AMBULATORY_CARE_PROVIDER_SITE_OTHER): Payer: PPO

## 2018-07-15 DIAGNOSIS — E538 Deficiency of other specified B group vitamins: Secondary | ICD-10-CM

## 2018-07-15 LAB — VITAMIN B12: Vitamin B-12: 444 pg/mL (ref 211–911)

## 2018-07-15 MED ORDER — CYANOCOBALAMIN 1000 MCG/ML IJ SOLN
1000.0000 ug | Freq: Once | INTRAMUSCULAR | Status: AC
  Administered 2018-07-15: 1000 ug via INTRAMUSCULAR

## 2018-07-15 NOTE — Progress Notes (Addendum)
Pt presents today for B12 injection. Left deltoid, IM. Pt voiced no concerns nor showed any signs of distress during injection.  Reviewed.  Dr Nicki Reaper

## 2018-08-05 ENCOUNTER — Other Ambulatory Visit: Payer: Self-pay | Admitting: Internal Medicine

## 2018-08-19 ENCOUNTER — Ambulatory Visit (INDEPENDENT_AMBULATORY_CARE_PROVIDER_SITE_OTHER): Payer: PPO | Admitting: *Deleted

## 2018-08-19 DIAGNOSIS — E538 Deficiency of other specified B group vitamins: Secondary | ICD-10-CM

## 2018-08-19 DIAGNOSIS — Z23 Encounter for immunization: Secondary | ICD-10-CM

## 2018-08-19 MED ORDER — CYANOCOBALAMIN 1000 MCG/ML IJ SOLN
1000.0000 ug | Freq: Once | INTRAMUSCULAR | Status: AC
  Administered 2018-08-19: 1000 ug via INTRAMUSCULAR

## 2018-08-19 NOTE — Progress Notes (Signed)
Patient presented for B 12 injection to right deltoid, patient voiced no concerns nor showed any signs of distress during injection. 

## 2018-08-29 ENCOUNTER — Other Ambulatory Visit: Payer: Self-pay | Admitting: Internal Medicine

## 2018-09-01 DIAGNOSIS — C44729 Squamous cell carcinoma of skin of left lower limb, including hip: Secondary | ICD-10-CM | POA: Diagnosis not present

## 2018-09-01 DIAGNOSIS — L570 Actinic keratosis: Secondary | ICD-10-CM | POA: Diagnosis not present

## 2018-09-01 DIAGNOSIS — D485 Neoplasm of uncertain behavior of skin: Secondary | ICD-10-CM | POA: Diagnosis not present

## 2018-09-23 ENCOUNTER — Other Ambulatory Visit: Payer: Self-pay

## 2018-09-23 ENCOUNTER — Ambulatory Visit (INDEPENDENT_AMBULATORY_CARE_PROVIDER_SITE_OTHER): Payer: PPO

## 2018-09-23 DIAGNOSIS — E538 Deficiency of other specified B group vitamins: Secondary | ICD-10-CM | POA: Diagnosis not present

## 2018-09-23 MED ORDER — CYANOCOBALAMIN 1000 MCG/ML IJ SOLN
1000.0000 ug | Freq: Once | INTRAMUSCULAR | Status: AC
  Administered 2018-09-23: 1000 ug via INTRAMUSCULAR

## 2018-09-23 MED ORDER — FERROUS SULFATE 325 (65 FE) MG PO TABS: ORAL_TABLET | ORAL | 0 refills | Status: DC

## 2018-09-23 NOTE — Progress Notes (Addendum)
Patient comes in for B 12 injection.  Injected left deltoid.  Patient tolerated injection well.   Reviewed.  Dr Scott 

## 2018-10-27 DIAGNOSIS — L298 Other pruritus: Secondary | ICD-10-CM | POA: Diagnosis not present

## 2018-10-27 DIAGNOSIS — L538 Other specified erythematous conditions: Secondary | ICD-10-CM | POA: Diagnosis not present

## 2018-10-27 DIAGNOSIS — C44729 Squamous cell carcinoma of skin of left lower limb, including hip: Secondary | ICD-10-CM | POA: Diagnosis not present

## 2018-10-27 DIAGNOSIS — L820 Inflamed seborrheic keratosis: Secondary | ICD-10-CM | POA: Diagnosis not present

## 2018-10-28 ENCOUNTER — Encounter: Payer: Self-pay | Admitting: Family Medicine

## 2018-10-28 ENCOUNTER — Ambulatory Visit (INDEPENDENT_AMBULATORY_CARE_PROVIDER_SITE_OTHER): Payer: PPO | Admitting: Family Medicine

## 2018-10-28 ENCOUNTER — Ambulatory Visit: Payer: PPO

## 2018-10-28 VITALS — BP 140/72 | HR 72 | Temp 98.3°F | Ht 62.0 in | Wt 142.0 lb

## 2018-10-28 DIAGNOSIS — R059 Cough, unspecified: Secondary | ICD-10-CM

## 2018-10-28 DIAGNOSIS — K219 Gastro-esophageal reflux disease without esophagitis: Secondary | ICD-10-CM

## 2018-10-28 DIAGNOSIS — E538 Deficiency of other specified B group vitamins: Secondary | ICD-10-CM

## 2018-10-28 DIAGNOSIS — R05 Cough: Secondary | ICD-10-CM

## 2018-10-28 MED ORDER — CYANOCOBALAMIN 1000 MCG/ML IJ SOLN
1000.0000 ug | Freq: Once | INTRAMUSCULAR | Status: AC
  Administered 2018-10-28: 1000 ug via INTRAMUSCULAR

## 2018-10-28 MED ORDER — FAMOTIDINE 20 MG PO TABS: 20.0000 mg | ORAL_TABLET | Freq: Every day | ORAL | 0 refills | Status: DC

## 2018-10-28 MED ORDER — HYDROCOD POLST-CPM POLST ER 10-8 MG/5ML PO SUER
5.0000 mL | Freq: Two times a day (BID) | ORAL | 0 refills | Status: DC | PRN
Start: 2018-10-28 — End: 2019-05-12

## 2018-10-28 NOTE — Progress Notes (Signed)
Subjective:    Patient ID: Nancy Hamilton, female    DOB: [birth year], 72 y.o.   MRN: [id number]  HPI  Presents to clinic c/o worsening cough for 2+ weeks.  Patient states that she has a longstanding history of chronic cough that is related to GERD.  Patient states she has been worked up multiple times for cough including seeing pulmonology, but nothing is ever found wrong with her lungs.  Denies any phlegm with cough.  Denies fever or chills.  Denies shortness of breath or wheezing.  Does report a heartburn type sensation in upper abdomen.  Patient states she did eat a large amount of chocolate over the past 2 days, and this has made her cough seem worse.  Patient does take Protonix 40 mg daily for GERD control.  Also reports she was recently treated 2 weeks ago with antibiotic for a sinus infection from the walkin, currently sinus congestion and postnasal drip symptoms have improved.  Also would like her b12 shot today, gets this monthly   Patient Active Problem List   Diagnosis Date Noted  . Stress incontinence 03/22/2018  . Onychomycosis 03/22/2018  . Personal history of colonic polyps   . Benign neoplasm of descending colon   . Benign paroxysmal positional vertigo of right ear 11/14/2016  . Cervical disc disorder with radiculopathy of cervical region 03/09/2016  . Shoulder pain, right 03/09/2016  . Statin intolerance 06/08/2014  . B12 deficiency 06/07/2014  . Iron deficiency 06/07/2014  . Adenomatous polyps 02/28/2014  . Anemia 02/26/2014  . Visit for preventive health examination 02/20/2013  . Generalized anxiety disorder 12/01/2012  . GERD (gastroesophageal reflux disease) 12/01/2012  . Hyperlipidemia LDL goal <160 12/01/2012   Social History   Tobacco Use  . Smoking status: Former Smoker    Last attempt to quit: 08/10/1992    Years since quitting: 26.2  . Smokeless tobacco: Never Used  Substance Use Topics  . Alcohol use: No   Review of Systems    Constitutional: Negative for chills, fatigue and fever.  HENT: Negative for congestion, ear pain, sinus pain and sore throat.   Eyes: Negative.   Respiratory: +cough, dry.  No SOB or wheezes. Cardiovascular: Negative for chest pain, palpitations and leg swelling.  Gastrointestinal: +heartburn. Negative for abdominal pain, diarrhea, nausea and vomiting.  Genitourinary: Negative for dysuria, frequency and urgency.  Musculoskeletal: Negative for arthralgias and myalgias.  Skin: Negative for color change, pallor and rash.  Neurological: Negative for syncope, light-headedness and headaches.  Psychiatric/Behavioral: The patient is not nervous/anxious.       Objective:   Physical Exam  Constitutional: She appears well-developed and well-nourished. No distress.  HENT:  Head: Normocephalic and atraumatic.  Eyes: EOM are normal. No scleral icterus.  Neck: Normal range of motion. Neck supple. No tracheal deviation present.  Cardiovascular: Normal rate, regular rhythm and normal heart sounds.  Pulmonary/Chest: Effort normal and breath sounds normal. No respiratory distress. She has no wheezes. She has no rales.  Abdominal: Soft. Bowel sounds are normal. There is no tenderness.  Neurological: She is alert and oriented to person, place, and time. Gait normal  Skin: Skin is warm and dry. No pallor.  Psychiatric: She has a normal mood and affect. Her behavior is normal. Thought content normal.   Nursing note and vitals reviewed.   Vitals:   10/28/18 0922  BP: 140/72  Pulse: 72  Temp: 98.3 F (36.8 C)  SpO2: 92%      Assessment & Plan:  Cough/GERD - Lungs clear on exam, suspect cough is related to her GERD, patient declines chest x-ray in clinic today.  She will use Tussionex syrup as needed to see if this helps calm cough and allow her to get some sleep at night; [place] PMP registry checked and si appropriate for this Rx.  Patient also will add on Pepcid 20 mg once daily to her medication  regimen, I am hoping that the Pepcid on top of pantoprazole will help improve acid reflux and in turn improve cough.  Patient also will avoid very acidic foods that seem to upset her GERD like chocolate.  After about a month, patient will stop taking the Pepcid and see how her GERD symptoms do with going back to just taking Protonix 40 mg once daily.  B12 deficiency -B12 injection given in clinic today. Administrations This Visit    cyanocobalamin ((VITAMIN B-12)) injection 1,000 mcg    Admin Date 10/28/2018 Action Given Dose 1000 mcg Route Intramuscular Administered By Neta Ehlers, RMA          Patient will keep scheduled upcoming follow-up with PCP as planned.  She will return to clinic sooner if any issues arise.

## 2018-11-03 DIAGNOSIS — D3132 Benign neoplasm of left choroid: Secondary | ICD-10-CM | POA: Diagnosis not present

## 2018-12-02 ENCOUNTER — Ambulatory Visit (INDEPENDENT_AMBULATORY_CARE_PROVIDER_SITE_OTHER): Payer: PPO

## 2018-12-02 ENCOUNTER — Other Ambulatory Visit: Payer: Self-pay

## 2018-12-02 DIAGNOSIS — E538 Deficiency of other specified B group vitamins: Secondary | ICD-10-CM | POA: Diagnosis not present

## 2018-12-02 MED ORDER — FLUTICASONE PROPIONATE 50 MCG/ACT NA SUSP: NASAL | 1 refills | Status: DC

## 2018-12-02 MED ORDER — CYANOCOBALAMIN 1000 MCG/ML IJ SOLN
1000.0000 ug | Freq: Once | INTRAMUSCULAR | Status: AC
  Administered 2018-12-02: 1000 ug via INTRAMUSCULAR

## 2018-12-02 MED ORDER — PANTOPRAZOLE SODIUM 40 MG PO TBEC: 40.0000 mg | DELAYED_RELEASE_TABLET | Freq: Every day | ORAL | 1 refills | Status: DC

## 2018-12-02 MED ORDER — DOCUSATE SODIUM 100 MG PO CAPS: 100.0000 mg | ORAL_CAPSULE | Freq: Two times a day (BID) | ORAL | 1 refills | Status: DC

## 2018-12-02 MED ORDER — LEVOCETIRIZINE DIHYDROCHLORIDE 5 MG PO TABS: 5.0000 mg | ORAL_TABLET | Freq: Every evening | ORAL | 1 refills | Status: DC

## 2018-12-02 NOTE — Progress Notes (Addendum)
Patient presents today for a B12 injection. Right deltoid. Patient voiced no concerns nor showed any signs of distress during injection.   Reviewed.  Dr Nicki Reaper

## 2018-12-19 ENCOUNTER — Ambulatory Visit (INDEPENDENT_AMBULATORY_CARE_PROVIDER_SITE_OTHER): Payer: PPO

## 2018-12-19 ENCOUNTER — Ambulatory Visit (INDEPENDENT_AMBULATORY_CARE_PROVIDER_SITE_OTHER): Payer: PPO | Admitting: Internal Medicine

## 2018-12-19 ENCOUNTER — Encounter: Payer: Self-pay | Admitting: Internal Medicine

## 2018-12-19 VITALS — BP 128/72 | HR 64 | Temp 98.0°F | Resp 15 | Ht 62.0 in | Wt 137.4 lb

## 2018-12-19 DIAGNOSIS — R059 Cough, unspecified: Secondary | ICD-10-CM

## 2018-12-19 DIAGNOSIS — B351 Tinea unguium: Secondary | ICD-10-CM

## 2018-12-19 DIAGNOSIS — R05 Cough: Secondary | ICD-10-CM | POA: Diagnosis not present

## 2018-12-19 DIAGNOSIS — R053 Chronic cough: Secondary | ICD-10-CM

## 2018-12-19 DIAGNOSIS — I700 Atherosclerosis of aorta: Secondary | ICD-10-CM

## 2018-12-19 MED ORDER — PANTOPRAZOLE SODIUM 40 MG PO TBEC: 40.0000 mg | DELAYED_RELEASE_TABLET | Freq: Two times a day (BID) | ORAL | 1 refills | Status: DC

## 2018-12-19 NOTE — Progress Notes (Signed)
Subjective:  Patient ID: Nancy Hamilton, female    DOB: 1945/11/22  Age: 73 y.o. MRN: [id number]  CC: The primary encounter diagnosis was Cough. Diagnoses of Onychomycosis, Chronic cough, and Thoracic aortic atherosclerosis ([place]) were also pertinent to this visit.  HPI Nancy Hamilton presents for follow up on GERD, GAD hyperlipidemia,  and cervical disk disorder with radiculopathy   She has had an exacerbation of a  persistent  cough  Since December . She was treated for bacterial sinusitis by Lauren but the cough has persisted. She has had a chronic cough since early 2018.  .Talking makes it worse .  Dry cough . Starting to become an issue at night .  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    Workup reviewed:  Saw pulmonary once in 2018 [place])  .  Prescribed  Flonase,   Anti reflux meds and told to return for diagnostic bronch if no better.  Cough improved on Flonase  For a while  But has returned.  Has added adding mucinex, ,  Singulair,  Xyzal ,   Did not improve with addition of nighttime .  Declined chest x ray in December but willing to have it today   She has  a HH and reflux by 2014 EGD . Lifetime non smoker.    Outpatient Medications Prior to Visit  Medication Sig Dispense Refill  . chlorpheniramine-HYDROcodone (TUSSIONEX PENNKINETIC ER) 10-8 MG/5ML SUER Take 5 mLs by mouth every 12 (twelve) hours as needed for cough. 70 mL 0  . Cholecalciferol (HM VITAMIN D3) 2000 UNITS CAPS Take 1 capsule by mouth daily.    . cyanocobalamin (,VITAMIN B-12,) 1000 MCG/ML injection Inject 1 ml intrumuscularly daily x 3, then weekly x 4, then monthly thereafter 10 mL 2  . docusate sodium (COLACE) 100 MG capsule Take 1 capsule (100 mg total) by mouth 2 (two) times daily. 180 capsule 1  . famotidine (PEPCID) 20 MG tablet Take 1 tablet (20 mg total) by mouth at bedtime. 30 tablet 0  . ferrous sulfate 325 (65 FE) MG tablet take 1  tablet by mouth once daily with BREAKFAST 90 tablet 0  . FLUoxetine (PROZAC) 20 MG capsule Take 1 capsule (20 mg total) by mouth daily. 90 capsule 1  . fluticasone (FLONASE) 50 MCG/ACT nasal spray SHAKE LIQUID AND USE 2 SPRAYS IN EACH NOSTRIL EVERY DAY 48 g 1  . levocetirizine (XYZAL) 5 MG tablet Take 1 tablet (5 mg total) by mouth every evening. 90 tablet 1  . meclizine (ANTIVERT) 12.5 MG tablet Take 1 tablet (12.5 mg total) by mouth 3 (three) times daily as needed for dizziness. 30 tablet 0  . Misc Natural Products (OSTEO BI-FLEX JOINT SHIELD PO) Take 1 tablet by mouth daily.    . montelukast (SINGULAIR) 10 MG tablet TAKE 1 TABLET BY MOUTH AT BEDTIME 90 tablet 0  . Syringe, Disposable, 1 ML MISC For use with B12 injections 25 each 1  . pantoprazole (PROTONIX) 40 MG tablet Take 1 tablet (40 mg total) by mouth daily. 90 tablet 1  . terbinafine (LAMISIL) 250 MG tablet Take 1 tablet (250 mg total) by mouth daily. 30 tablet 2  . Calcium Carbonate-Vit D-Min (CALCIUM 1200 PO) Take 1 tablet by mouth daily.    . Coenzyme Q10 (CO Q 10 PO) Take 200 mg by mouth daily. Reported on 12/19/2015    . sodium chloride (OCEAN) 0.65 % nasal spray Place 2 sprays into the  nose as needed for congestion.    . triamcinolone cream (KENALOG) 0.1 % Apply 1 application topically 2 (two) times daily as needed. Reported on 12/19/2015  0   No facility-administered medications prior to visit.     Review of Systems;  Patient denies headache, fevers, malaise, unintentional weight loss, skin rash, eye pain, sinus congestion and sinus pain, sore throat, dysphagia,  Hemoptysis,dyspnea, wheezing, chest pain, palpitations, orthopnea, edema, abdominal pain, nausea, melena, diarrhea, constipation, flank pain, dysuria, hematuria, urinary  Frequency, nocturia, numbness, tingling, seizures,  Focal weakness, Loss of consciousness,  Tremor, insomnia, depression, anxiety, and suicidal ideation.      Objective:  BP 128/72 (BP Location: Left  Arm, Patient Position: Sitting, Cuff Size: Normal)   Pulse 64   Temp 98 F (36.7 C) (Oral)   Resp 15   Ht 5\' 2"  (1.575 m)   Wt 137 lb 6.4 oz (62.3 kg)   SpO2 98%   BMI 25.13 kg/m   BP Readings from Last 3 Encounters:  12/19/18 128/72  10/28/18 140/72  06/26/18 122/62    Wt Readings from Last 3 Encounters:  12/19/18 137 lb 6.4 oz (62.3 kg)  10/28/18 142 lb (64.4 kg)  06/26/18 134 lb 12.8 oz (61.1 kg)    General appearance: alert, cooperative and appears stated age Ears: normal TM's and external ear canals both ears Throat: lips, mucosa, and tongue normal; teeth and gums normal Neck: no adenopathy, no carotid bruit, supple, symmetrical, trachea midline and thyroid not enlarged, symmetric, no tenderness/mass/nodules Back: symmetric, no curvature. ROM normal. No CVA tenderness. Lungs: clear to auscultation bilaterally Heart: regular rate and rhythm, S1, S2 normal, no murmur, click, rub or gallop Lymph nodes: Cervical, supraclavicular, and axillary nodes normal.    Assessment & Plan:   Problem List Items Addressed This Visit    Thoracic aortic atherosclerosis ([place])    Noted on chest x ray.   Will have her return for fasting lipids and discuss statin trial  Lab Results  Component Value Date   CHOL 201 (H) 03/19/2018   HDL 61.90 03/19/2018   LDLCALC 119 (H) 03/19/2018   TRIG 97.0 03/19/2018   CHOLHDL 3 03/19/2018         Onychomycosis    Treated with terbinafine x 3 months in may 2019.  Refills will require liver function assessment       Chronic cough    Etiology now unclear.  She has been treated for allergic rhinitis and GERD without improvement. She has a hiatal hernia.  No prior esophageal biopsy to rule out EE.  Chest x ray normal except for Aortic atherosclerosis.  Refer to Derrill Kay for evaluation with potential need for bronchoscopy       Other Visit Diagnoses    Cough    -  Primary   Relevant Orders   DG Chest 2 View (Completed)   Ambulatory  referral to Pulmonology     A total of 25 minutes of face to face time was spent with patient more than half of which was spent in counselling about the above mentioned conditions  and coordination of care   I have discontinued Anastasia H. Sisney's Coenzyme Q10 (CO Q 10 PO), sodium chloride, triamcinolone cream, Calcium Carbonate-Vit D-Min (CALCIUM 1200 PO), and terbinafine. I have also changed her pantoprazole. Additionally, I am having her maintain her Cholecalciferol, Misc Natural Products (OSTEO BI-FLEX JOINT SHIELD PO), cyanocobalamin, Syringe (Disposable), meclizine, FLUoxetine, montelukast, ferrous sulfate, famotidine, chlorpheniramine-HYDROcodone, docusate sodium, levocetirizine, and fluticasone.  Meds  ordered this encounter  Medications  . pantoprazole (PROTONIX) 40 MG tablet    Sig: Take 1 tablet (40 mg total) by mouth 2 (two) times daily.    Dispense:  180 tablet    Refill:  1    Medications Discontinued During This Encounter  Medication Reason  . Coenzyme Q10 (CO Q 10 PO) Patient has not taken in last 30 days  . Calcium Carbonate-Vit D-Min (CALCIUM 1200 PO) Patient has not taken in last 30 days  . sodium chloride (OCEAN) 0.65 % nasal spray Patient Preference  . triamcinolone cream (KENALOG) 0.1 % Patient has not taken in last 30 days  . pantoprazole (PROTONIX) 40 MG tablet   . terbinafine (LAMISIL) 250 MG tablet     Follow-up: No follow-ups on file.   Crecencio Mc, MD

## 2018-12-19 NOTE — Patient Instructions (Addendum)
Please increase your pantoprazole to two times daily ,  Take the second dose 30 minutes before dinner  Your hiatal hernia makes reflux harder to treat because part of your stomach is above the diaphragm  Chest x ray today to evaluate lungs  Referral to Dr Derrill Kay ,  pulmonology in [place]

## 2018-12-21 DIAGNOSIS — I700 Atherosclerosis of aorta: Secondary | ICD-10-CM | POA: Insufficient documentation

## 2018-12-21 NOTE — Assessment & Plan Note (Signed)
Treated with terbinafine x 3 months in may 2019.  Refills will require liver function assessment

## 2018-12-21 NOTE — Assessment & Plan Note (Signed)
Noted on chest x ray.   Will have her return for fasting lipids and discuss statin trial  Lab Results  Component Value Date   CHOL 201 (H) 03/19/2018   HDL 61.90 03/19/2018   LDLCALC 119 (H) 03/19/2018   TRIG 97.0 03/19/2018   CHOLHDL 3 03/19/2018

## 2018-12-21 NOTE — Assessment & Plan Note (Signed)
Etiology now unclear.  She has been treated for allergic rhinitis and GERD without improvement. She has a hiatal hernia.  No prior esophageal biopsy to rule out EE.  Chest x ray normal except for Aortic atherosclerosis.  Refer to Derrill Kay for evaluation with potential need for bronchoscopy

## 2018-12-26 ENCOUNTER — Telehealth: Payer: Self-pay

## 2018-12-26 NOTE — Telephone Encounter (Signed)
See result note message 

## 2018-12-26 NOTE — Telephone Encounter (Signed)
Copied from [place] [id number]. Topic: General - Other >> Dec 26, 2018 11:42 AM Carolyn Stare wrote:   Pt returned Nancy Hamilton call  said call her cell phone  [phone]

## 2019-01-02 ENCOUNTER — Telehealth: Payer: Self-pay | Admitting: Internal Medicine

## 2019-01-02 DIAGNOSIS — L570 Actinic keratosis: Secondary | ICD-10-CM | POA: Diagnosis not present

## 2019-01-02 DIAGNOSIS — D2272 Melanocytic nevi of left lower limb, including hip: Secondary | ICD-10-CM | POA: Diagnosis not present

## 2019-01-02 DIAGNOSIS — C44729 Squamous cell carcinoma of skin of left lower limb, including hip: Secondary | ICD-10-CM | POA: Diagnosis not present

## 2019-01-02 DIAGNOSIS — D2262 Melanocytic nevi of left upper limb, including shoulder: Secondary | ICD-10-CM | POA: Diagnosis not present

## 2019-01-02 DIAGNOSIS — D485 Neoplasm of uncertain behavior of skin: Secondary | ICD-10-CM | POA: Diagnosis not present

## 2019-01-02 DIAGNOSIS — X32XXXA Exposure to sunlight, initial encounter: Secondary | ICD-10-CM | POA: Diagnosis not present

## 2019-01-02 DIAGNOSIS — Z85828 Personal history of other malignant neoplasm of skin: Secondary | ICD-10-CM | POA: Diagnosis not present

## 2019-01-02 DIAGNOSIS — D2261 Melanocytic nevi of right upper limb, including shoulder: Secondary | ICD-10-CM | POA: Diagnosis not present

## 2019-01-02 NOTE — Telephone Encounter (Signed)
Pt stated she spoke to a nurse from our office last week. Nurse said that Dr. Derrel Nip suggested that she goes on cholestrol medication. She does not want to go on Liptor. Please advise.

## 2019-01-05 NOTE — Progress Notes (Addendum)
[facility] Consultation      Assessment and Plan:  Allergic rhinitis. --Continue Flonase.  Chronic cough. --Continue current measures for sympomatic relief of cough.  --We will start Asmanex inhaler 2 puffs twice daily.  She was demonstrated use today. - Start Tessalon 2 tablets 3 times daily. - Chest x-ray reviewed was normal however there is suggestion of possible early chronic bronchitis or interstitial changes in the bases.  Will consider CT chest at next visit if symptoms persist.  Reflux. --Continue anti-reflux measures.  --Understands chocolate can contribute to reflux.   Orders Placed This Encounter  Procedures  . DG Chest 2 View   Meds ordered this encounter  Medications  . benzonatate (TESSALON PERLES) 100 MG capsule    Sig: Take 2 capsules (200 mg total) by mouth 3 (three) times daily.    Dispense:  90 capsule    Refill:  2   Return in about 6 weeks (around 02/17/2019).   Date: 01/05/2019  MRN# [id number] Nancy Hamilton 10/19/1946    Nancy JEFFCOAT is a 73 y.o. old female seen in consultation for chief complaint of:    Chief Complaint  Patient presents with  . Acute Visit    Acute visit for cough. She states the cough did improve temporarily but recently has gotten worse. She states she is using flonase which is usually helpful. 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     HPI:  The patient is a 73 year old female with longstanding cough of 10 years duration.  At last visit she was asked to use Flonase, and continued on symptomatic treatment for cough.  We also discussed antireflux measures and avoiding chocolate, as she noted that it made her reflux worse.She has had a negative methacholine challenge test in the past.   Since her last visit she felt that that the flonase helped. However over the past 6 months the cough got worse. It is worse in the AM, and with talking. She normally does not cough from sleep but  she has started recently. Her PPI was changed to bid, but that has not helped. She takes no inhalers currently, she has tried in the past but it did not help.  No pets at home. She remains on singulair and Xyzal.  She is not on tessalon, not helpful when tried 10 yrs ago.    O.V 05/07/2017: The patient is here for a chronic cough of 10 years duration.  She has seen an allergist several years ago, she was diagnosed with acid reflux and put on protonix which helped.  However she noticed a few years ago, it got worse again a few years ago. She started working in a greenhouse about 8 years ago.  She has no pets home, she grew up on a farm. She has had allergy testing, which was apparently negative. She has been tried on breo and [employer] which did not help.  She eats 1 snickers per day and notes that it can make her reflux worse. She drinks decaf.  Cough lozenges and guaifenesin (delsym) prn helps with the cough.   She has been taking Xyzal, Singulair and Protonix daily.  **Chest x-ray 12/19/2018>> imaging personally reviewed, overall chest x-ray is unremarkable with suggestion of chronic bronchitis, and some early changes of interstitial lung disease in the right base. I personally reviewed the x-ray films; chest x-ray 04/24/16: Unremarkable Lungs.  I personally reviewed methacholine challenge testing data; greatest change in FEV1  of 9% at maximum dose of methacholine. This is consistent with a negative test.  I personally reviewed PFt tracings: FEV= 105%; ratio=79%; no abnormalities detected.   Medication:    Current Outpatient Medications:  .  chlorpheniramine-HYDROcodone (TUSSIONEX PENNKINETIC ER) 10-8 MG/5ML SUER, Take 5 mLs by mouth every 12 (twelve) hours as needed for cough., Disp: 70 mL, Rfl: 0 .  Cholecalciferol (HM VITAMIN D3) 2000 UNITS CAPS, Take 1 capsule by mouth daily., Disp: , Rfl:  .  cyanocobalamin (,VITAMIN B-12,) 1000 MCG/ML injection, Inject 1 ml intrumuscularly daily x 3,  then weekly x 4, then monthly thereafter, Disp: 10 mL, Rfl: 2 .  docusate sodium (COLACE) 100 MG capsule, Take 1 capsule (100 mg total) by mouth 2 (two) times daily., Disp: 180 capsule, Rfl: 1 .  famotidine (PEPCID) 20 MG tablet, Take 1 tablet (20 mg total) by mouth at bedtime., Disp: 30 tablet, Rfl: 0 .  ferrous sulfate 325 (65 FE) MG tablet, take 1 tablet by mouth once daily with BREAKFAST, Disp: 90 tablet, Rfl: 0 .  FLUoxetine (PROZAC) 20 MG capsule, Take 1 capsule (20 mg total) by mouth daily., Disp: 90 capsule, Rfl: 1 .  fluticasone (FLONASE) 50 MCG/ACT nasal spray, SHAKE LIQUID AND USE 2 SPRAYS IN EACH NOSTRIL EVERY DAY, Disp: 48 g, Rfl: 1 .  levocetirizine (XYZAL) 5 MG tablet, Take 1 tablet (5 mg total) by mouth every evening., Disp: 90 tablet, Rfl: 1 .  meclizine (ANTIVERT) 12.5 MG tablet, Take 1 tablet (12.5 mg total) by mouth 3 (three) times daily as needed for dizziness., Disp: 30 tablet, Rfl: 0 .  Misc Natural Products (OSTEO BI-FLEX JOINT SHIELD PO), Take 1 tablet by mouth daily., Disp: , Rfl:  .  montelukast (SINGULAIR) 10 MG tablet, TAKE 1 TABLET BY MOUTH AT BEDTIME, Disp: 90 tablet, Rfl: 0 .  pantoprazole (PROTONIX) 40 MG tablet, Take 1 tablet (40 mg total) by mouth 2 (two) times daily., Disp: 180 tablet, Rfl: 1 .  Syringe, Disposable, 1 ML MISC, For use with B12 injections, Disp: 25 each, Rfl: 1   Allergies:  Tylox [oxycodone-acetaminophen]  Review of Systems:  Constitutional: Feels well. Cardiovascular: Denies chest pain, exertional chest pain.  Pulmonary: Denies hemoptysis, pleuritic chest pain.   The remainder of systems were reviewed and were found to be negative other than what is documented in the HPI.    Physical Examination:   VS: BP 122/70   Pulse 69   Ht 5\' 2"  (1.575 m)   Wt 141 lb 3.2 oz (64 kg)   SpO2 98%   BMI 25.83 kg/m   General Appearance: No distress  Neuro:without focal findings, mental status, speech normal, alert and oriented HEENT: PERRLA, EOM  intact Pulmonary: No wheezing, No rales  CardiovascularNormal S1,S2.  No m/r/g.  Abdomen: Benign, Soft, non-tender, No masses Renal:  No costovertebral tenderness  GU:  No performed at this time. Endoc: No evident thyromegaly, no signs of acromegaly or Cushing features Skin:   warm, no rashes, no ecchymosis  Extremities: normal, no cyanosis, clubbing.      LABORATORY PANEL:   CBC No results for input(s): WBC, HGB, HCT, PLT in the last 168 hours. ------------------------------------------------------------------------------------------------------------------  Chemistries  No results for input(s): NA, K, CL, CO2, GLUCOSE, BUN, CREATININE, CALCIUM, MG, AST, ALT, ALKPHOS, BILITOT in the last 168 hours.  Invalid input(s): GFRCGP ------------------------------------------------------------------------------------------------------------------  Cardiac Enzymes No results for input(s): TROPONINI in the last 168 hours. ------------------------------------------------------------  RADIOLOGY:  No results found.  Thank  you for the consultation and for allowing [facility] to assist in the care of your patient. Our recommendations are noted above.  Please contact us if we can be of further service.  Marda Stalker, M.D., F.C.C.P.  Board Certified in Internal Medicine, Pulmonary Medicine, [place], and Sleep Medicine.  [facility] Office Number: [phone]  01/05/2019

## 2019-01-05 NOTE — Telephone Encounter (Signed)
That is her choice,

## 2019-01-06 ENCOUNTER — Ambulatory Visit: Payer: PPO | Admitting: Internal Medicine

## 2019-01-06 ENCOUNTER — Encounter: Payer: Self-pay | Admitting: Internal Medicine

## 2019-01-06 ENCOUNTER — Ambulatory Visit (INDEPENDENT_AMBULATORY_CARE_PROVIDER_SITE_OTHER): Payer: PPO

## 2019-01-06 VITALS — BP 122/70 | HR 69 | Ht 62.0 in | Wt 141.2 lb

## 2019-01-06 DIAGNOSIS — E538 Deficiency of other specified B group vitamins: Secondary | ICD-10-CM

## 2019-01-06 DIAGNOSIS — R059 Cough, unspecified: Secondary | ICD-10-CM

## 2019-01-06 DIAGNOSIS — R05 Cough: Secondary | ICD-10-CM | POA: Diagnosis not present

## 2019-01-06 MED ORDER — CYANOCOBALAMIN 1000 MCG/ML IJ SOLN
1000.0000 ug | Freq: Once | INTRAMUSCULAR | Status: AC
  Administered 2019-01-06: 1000 ug via INTRAMUSCULAR

## 2019-01-06 MED ORDER — BENZONATATE 100 MG PO CAPS: 200.0000 mg | ORAL_CAPSULE | Freq: Three times a day (TID) | ORAL | 2 refills | Status: DC

## 2019-01-06 MED ORDER — MOMETASONE FUROATE 200 MCG/ACT IN AERO: 2.0000 | INHALATION_SPRAY | Freq: Two times a day (BID) | RESPIRATORY_TRACT | 0 refills | Status: DC

## 2019-01-06 NOTE — Addendum Note (Signed)
Addended by: Vivia Ewing on: 01/06/2019 10:16 AM   Modules accepted: Orders

## 2019-01-06 NOTE — Progress Notes (Addendum)
Patient presents today for B12 injection. Left deltoid. Patient voiced no concerns nor showed any signs of distress during injection.   Reviewed.  Dr Nicki Reaper

## 2019-01-06 NOTE — Patient Instructions (Addendum)
Start asmanex inhaler 2 puffs twice daily. Rinse mouth after use.  Use the inhaler for 1 month.  When it runs out call us if it helped and we will give you a prescription. Start tessalon tablets 2 tablets three times daily.

## 2019-01-09 ENCOUNTER — Telehealth: Payer: Self-pay | Admitting: Internal Medicine

## 2019-01-09 DIAGNOSIS — Z79899 Other long term (current) drug therapy: Secondary | ICD-10-CM

## 2019-01-09 NOTE — Telephone Encounter (Signed)
Crestor is not in the pt's current medication list.

## 2019-01-09 NOTE — Telephone Encounter (Signed)
Patient has a history of statin intolerance and has never been on Crestor.  Please call patient and find out what is going on

## 2019-01-09 NOTE — Telephone Encounter (Signed)
Copied from [place] [id number]. Topic: Quick Communication - See Telephone Encounter >> Jan 09, 2019  4:02 PM Blase Mess A wrote: CRM for notification. See Telephone encounter for: 01/09/19.  Patient is requesting a new script for Crestor to treat high cholesterol. Patient states that she called previously but the script was not sent to the drug store. Please advise [id number] [address], [place] [phone] (Phone) [phone] (Fax)

## 2019-01-12 NOTE — Telephone Encounter (Signed)
LMTCB. PEC may speak with pt.  

## 2019-01-14 ENCOUNTER — Telehealth: Payer: Self-pay | Admitting: Internal Medicine

## 2019-01-14 MED ORDER — ROSUVASTATIN CALCIUM 5 MG PO TABS: 5.0000 mg | ORAL_TABLET | Freq: Every day | ORAL | 5 refills | Status: DC

## 2019-01-14 NOTE — Telephone Encounter (Signed)
Pt returned call to office and states per chest xray results on 12/19/18 it was recommended that she start on Lipitor but the medication was not called into the pharmacy. Pt states she was requesting to be on Crestor and not Lipitor because she has heard a lot of people complain of having trouble with leg cramping at night after taking Lipitor. Pt states she haas not heard of people having as much trouble with taking Crestor. Pt states that if Dr. Derrel Nip still thinks she needs to take Lipitor she will and would like for the prescription to be sent to [facility] on [address]. Pt also wants Dr. Derrel Nip to be aware that she was placed on tessalon pearls and inhaler by her pulmonologist just in case there was the potential of interactions with newly prescribed medication. Pt prefers to be contacted on her cell phone, [phone] and states it is ok to leave a message if she is unavailable.

## 2019-01-14 NOTE — Telephone Encounter (Signed)
Generic crestor sent to [facility].    Needs hepatic panel checked in 3 weeks

## 2019-01-14 NOTE — Telephone Encounter (Signed)
Attempted to call patient, cell phone unable to leave message. LM at Home number for patient to call.

## 2019-01-14 NOTE — Telephone Encounter (Signed)
Spoke to patient regarding request for inhaler. Stated that she is still coughing and using the tessalon. Wanted to know if we should send in rx for inhaler or just continue to use the tessalon. Patient stated that she would not be able to afford the inhaler if insurance does not pay for it.  Will confirm with Dr. Juanell Fairly then let patient know his recommendation.

## 2019-01-15 ENCOUNTER — Other Ambulatory Visit: Payer: Self-pay | Admitting: Internal Medicine

## 2019-01-15 MED ORDER — MOMETASONE FUROATE 200 MCG/ACT IN AERO: 2.0000 | INHALATION_SPRAY | Freq: Two times a day (BID) | RESPIRATORY_TRACT | 0 refills | Status: DC

## 2019-01-15 NOTE — Telephone Encounter (Signed)
Spoke with pt to let her know that the crestor has been sent in and that she will need a non fasting lab appt in 3 weeks to check her liver function. Pt stated that she has a nurse visit on 02/03/2019 and would like to have the lab work done then.

## 2019-01-15 NOTE — Telephone Encounter (Signed)
Sent in script for asmanex inhaler.

## 2019-01-15 NOTE — Telephone Encounter (Signed)
Patient aware inhaler sent in. Nothing further needed at this time.

## 2019-01-16 ENCOUNTER — Telehealth: Payer: Self-pay | Admitting: Internal Medicine

## 2019-01-16 MED ORDER — MOMETASONE FUROATE 200 MCG/ACT IN AERO: 2.0000 | INHALATION_SPRAY | Freq: Two times a day (BID) | RESPIRATORY_TRACT | 5 refills | Status: DC

## 2019-01-16 NOTE — Telephone Encounter (Signed)
Call made to patient, she is requesting a refill of her Asmanex. Confirmed pharmacy. Refill sent. She inquired as to if I knew how much it cost, she states if it is too expensive she will not be able to get it. Informed patient if it was too expensive to call us and we could have her fill out a patient assistance application to help pay. Voiced understanding. Nothing further is needed at this time.

## 2019-01-19 ENCOUNTER — Other Ambulatory Visit: Payer: Self-pay | Admitting: Internal Medicine

## 2019-01-20 ENCOUNTER — Telehealth: Payer: Self-pay | Admitting: Internal Medicine

## 2019-01-20 NOTE — Telephone Encounter (Signed)
LM on VM for patient to call back. We do not have any samples at this time.

## 2019-01-20 NOTE — Telephone Encounter (Signed)
Spoke to patient regarding asmanex inhaler. Her pharmacy will not have it until 3/27. Patient states she has gotten fever blisters in her nose and mouth since starting the inhaler, doesn't know if she needs to stop it or continue using. Will confirm with Dr. Juanell Fairly what the patient should do. States her cough is much better.

## 2019-01-21 NOTE — Telephone Encounter (Signed)
Pt is aware of below recommendations and voiced her understanding. Nothing further is needed.  

## 2019-01-21 NOTE — Telephone Encounter (Signed)
The inhaler does not usually cause this, can try stopping it for a week. Make sure to wash mouth after using it when restarting.

## 2019-01-27 DIAGNOSIS — C44729 Squamous cell carcinoma of skin of left lower limb, including hip: Secondary | ICD-10-CM | POA: Diagnosis not present

## 2019-02-03 ENCOUNTER — Ambulatory Visit (INDEPENDENT_AMBULATORY_CARE_PROVIDER_SITE_OTHER): Payer: PPO

## 2019-02-03 ENCOUNTER — Other Ambulatory Visit: Payer: Self-pay

## 2019-02-03 DIAGNOSIS — Z79899 Other long term (current) drug therapy: Secondary | ICD-10-CM | POA: Diagnosis not present

## 2019-02-03 DIAGNOSIS — E538 Deficiency of other specified B group vitamins: Secondary | ICD-10-CM

## 2019-02-03 LAB — HEPATIC FUNCTION PANEL
ALT: 12 U/L (ref 0–35)
AST: 14 U/L (ref 0–37)
Albumin: 4.2 g/dL (ref 3.5–5.2)
Alkaline Phosphatase: 78 U/L (ref 39–117)
Bilirubin, Direct: 0.1 mg/dL (ref 0.0–0.3)
Total Bilirubin: 0.4 mg/dL (ref 0.2–1.2)
Total Protein: 6.8 g/dL (ref 6.0–8.3)

## 2019-02-03 MED ORDER — CYANOCOBALAMIN 1000 MCG/ML IJ SOLN
1000.0000 ug | Freq: Once | INTRAMUSCULAR | Status: AC
  Administered 2019-02-03: 1000 ug via INTRAMUSCULAR

## 2019-02-03 MED ORDER — ROSUVASTATIN CALCIUM 5 MG PO TABS: 5.0000 mg | ORAL_TABLET | Freq: Every day | ORAL | 3 refills | Status: DC

## 2019-02-03 MED ORDER — PANTOPRAZOLE SODIUM 40 MG PO TBEC: 40.0000 mg | DELAYED_RELEASE_TABLET | Freq: Two times a day (BID) | ORAL | 1 refills | Status: DC

## 2019-02-03 NOTE — Progress Notes (Addendum)
Patient came in today for B-12 injection in right deltoid. Patient tolerated well.   Reviewed.  Dr Scott 

## 2019-02-06 ENCOUNTER — Encounter: Payer: Self-pay | Admitting: *Deleted

## 2019-02-24 ENCOUNTER — Telehealth: Payer: Self-pay | Admitting: Internal Medicine

## 2019-02-24 ENCOUNTER — Ambulatory Visit: Payer: PPO | Admitting: Internal Medicine

## 2019-02-24 MED ORDER — MOMETASONE FUROATE 200 MCG/ACT IN AERO: 2.0000 | INHALATION_SPRAY | Freq: Two times a day (BID) | RESPIRATORY_TRACT | 3 refills | Status: DC

## 2019-02-24 NOTE — Telephone Encounter (Signed)
Called and spoke to pt, who requested Rx for 90 day supply of asmanex 200. Rx for Asmanex has been sent to preferred pharmacy, as pt was instructed to continue this medication.  Nothing further is needed.

## 2019-03-04 ENCOUNTER — Ambulatory Visit (INDEPENDENT_AMBULATORY_CARE_PROVIDER_SITE_OTHER): Payer: PPO

## 2019-03-04 ENCOUNTER — Other Ambulatory Visit: Payer: Self-pay

## 2019-03-04 DIAGNOSIS — E538 Deficiency of other specified B group vitamins: Secondary | ICD-10-CM

## 2019-03-04 MED ORDER — CYANOCOBALAMIN 1000 MCG/ML IJ SOLN
1000.0000 ug | Freq: Once | INTRAMUSCULAR | Status: AC
  Administered 2019-03-04: 09:00:00 1000 ug via INTRAMUSCULAR

## 2019-03-04 NOTE — Progress Notes (Signed)
Patient presented today for B12 injection.  Administered IM in left deltoid.  Patient tolerated well.

## 2019-03-11 ENCOUNTER — Telehealth: Payer: Self-pay | Admitting: Internal Medicine

## 2019-03-11 MED ORDER — BENZONATATE 100 MG PO CAPS: 200.0000 mg | ORAL_CAPSULE | Freq: Three times a day (TID) | ORAL | 0 refills | Status: DC

## 2019-03-11 NOTE — Telephone Encounter (Signed)
Called and spoke to pt, who requested 90 day supply for Tessalon. Rx for Tessalon has been sent to preferred pharmacy, as pt was instructed to continue this medication at last OV.  Nothing further is needed.

## 2019-03-26 ENCOUNTER — Encounter: Payer: PPO | Admitting: Internal Medicine

## 2019-04-07 ENCOUNTER — Ambulatory Visit (INDEPENDENT_AMBULATORY_CARE_PROVIDER_SITE_OTHER): Payer: PPO

## 2019-04-07 ENCOUNTER — Other Ambulatory Visit: Payer: Self-pay

## 2019-04-07 DIAGNOSIS — E538 Deficiency of other specified B group vitamins: Secondary | ICD-10-CM | POA: Diagnosis not present

## 2019-04-07 MED ORDER — CYANOCOBALAMIN 1000 MCG/ML IJ SOLN
1000.0000 ug | Freq: Once | INTRAMUSCULAR | Status: AC
  Administered 2019-04-07: 1000 ug via INTRAMUSCULAR

## 2019-04-07 NOTE — Progress Notes (Addendum)
Patient presented today for B12 injection.  Administered in the right deltoid.   Patient tolerated well with no signs of distress.    Reviewed.  Dr Nicki Reaper

## 2019-04-16 ENCOUNTER — Other Ambulatory Visit: Payer: Self-pay | Admitting: Internal Medicine

## 2019-04-21 ENCOUNTER — Other Ambulatory Visit: Payer: Self-pay

## 2019-04-21 MED ORDER — MONTELUKAST SODIUM 10 MG PO TABS: 10.0000 mg | ORAL_TABLET | Freq: Every day | ORAL | 1 refills | Status: DC

## 2019-04-23 ENCOUNTER — Other Ambulatory Visit: Payer: Self-pay | Admitting: Internal Medicine

## 2019-04-23 DIAGNOSIS — Z1231 Encounter for screening mammogram for malignant neoplasm of breast: Secondary | ICD-10-CM

## 2019-04-30 ENCOUNTER — Other Ambulatory Visit: Payer: Self-pay | Admitting: Internal Medicine

## 2019-05-04 ENCOUNTER — Telehealth: Payer: Self-pay | Admitting: Internal Medicine

## 2019-05-04 DIAGNOSIS — E785 Hyperlipidemia, unspecified: Secondary | ICD-10-CM

## 2019-05-04 DIAGNOSIS — D508 Other iron deficiency anemias: Secondary | ICD-10-CM

## 2019-05-04 DIAGNOSIS — E538 Deficiency of other specified B group vitamins: Secondary | ICD-10-CM

## 2019-05-04 DIAGNOSIS — E611 Iron deficiency: Secondary | ICD-10-CM

## 2019-05-04 NOTE — Telephone Encounter (Signed)
PT's cpe was moved to 06/23/2019. Pt would like to come for labs before appt. There are no orders in system. Let me know when they are in.

## 2019-05-06 ENCOUNTER — Encounter: Payer: PPO | Admitting: Internal Medicine

## 2019-05-06 ENCOUNTER — Other Ambulatory Visit: Payer: Self-pay | Admitting: Family Medicine

## 2019-05-06 DIAGNOSIS — K219 Gastro-esophageal reflux disease without esophagitis: Secondary | ICD-10-CM

## 2019-05-09 NOTE — Telephone Encounter (Signed)
fastig labs ordered

## 2019-05-10 ENCOUNTER — Other Ambulatory Visit: Payer: Self-pay | Admitting: Internal Medicine

## 2019-05-11 ENCOUNTER — Telehealth: Payer: Self-pay | Admitting: Internal Medicine

## 2019-05-11 NOTE — Telephone Encounter (Signed)
Called patient for COVID-19 pre-screening for in office visit.  Have you recently traveled any where out of the local area in the last 2 weeks? No  Have you been in close contact with a person diagnosed with COVID-19 or someone awaiting results within the last 2 weeks? No  Do you currently have any of the following symptoms? If so, when did they start? Cough (Yes-12 years)      Diarrhea Joint Pain Fever      Muscle Pain   Red eyes Shortness of breath   Abdominal pain  Vomiting Loss of smell    Rash    Sore Throat Headache    Weakness   Bruising or bleeding   Okay to proceed with visit 05/12/2019

## 2019-05-11 NOTE — Progress Notes (Signed)
[facility] Consultation      Assessment and Plan:  Allergic rhinitis. --Continue Flonase. - Continue Xyzal, continue Singulair.  Chronic cough. --Continue current measures for sympomatic relief of cough.  --We will trial off of Asmanex for 1 month to see if he makes a difference. - Restart Tessalon. - Chest x-ray reviewed was normal however there is suggestion of possible early chronic bronchitis or interstitial changes in the bases.  Discussed checking a CT chest and eventual bronchoscopy if all testing negative.  She would like to see how things go over the next 6 months and then consider CT chest at that time.  Reflux. --Continue anti-reflux measures.  --Understands chocolate can contribute to reflux.   Meds ordered this encounter  Medications   benzonatate (TESSALON) 100 MG capsule    Sig: Take 2 capsules (200 mg total) by mouth 3 (three) times daily.    Dispense:  180 capsule    Refill:  3     Return in about 6 months (around 11/11/2019).   Date: 05/11/2019  MRN# [id number] AILANY KOREN [birth year]    SARABETH BENTON is a 73 y.o. old female seen in consultation for chief complaint of:    Chief Complaint  Patient presents with   Follow-up    Cough improved with the Tessalon but has worsened since she ran out.    HPI:  SKYLENE DEREMER is a 73 y.o. female  with longstanding cough of 10 years duration.  At last visit she was asked to use Flonase, Asmanex, Tessalon.  And continued on symptomatic treatment for cough.  We also discussed antireflux measures and avoiding chocolate, as she noted that it made her reflux worse.She has had a negative methacholine challenge test in the past.  She had no significant improvement with PPI, inhalers, Singulair and Xyzal.  Since her last visit Tessalon appeared to help with the cough. She ran out and the cough got worse. She is taking 4 tabs per day total because the 6 did not make a difference and they are  very expensive.  She is taking asmanex but not sure that it is doing anything. She still notices that when she gets in her car, the cough gets worse.    O.V 05/07/2017: The patient is here for a chronic cough of 10 years duration.  She has seen an allergist several years ago, she was diagnosed with acid reflux and put on protonix which helped.  However she noticed a few years ago, it got worse again a few years ago. She started working in a greenhouse about 8 years ago.  She has no pets home, [place]. She has had allergy testing, which was apparently negative. She has been tried on breo and [employer] which did not help.  She eats 1 snickers per day and notes that it can make her reflux worse. She drinks decaf.  Cough lozenges and guaifenesin (delsym) prn helps with the cough.   Denies dyspnea.   She has been taking Xyzal, Singulair and Protonix daily.  **Chest x-ray 12/19/2018>> imaging personally reviewed, overall chest x-ray is unremarkable with suggestion of chronic bronchitis, and some early changes of interstitial lung disease in the right base. I personally reviewed the x-ray films; chest x-ray 04/24/16: Unremarkable Lungs.  I personally reviewed methacholine challenge testing data; greatest change in FEV1 of 9% at maximum dose of methacholine. This is consistent with a negative test.  I personally reviewed PFt tracings: FEV= 105%;  ratio=79%; no abnormalities detected.   Medication:    Current Outpatient Medications:    benzonatate (TESSALON) 100 MG capsule, TAKE 2 CAPSULES BY MOUTH THREE TIMES DAILY, Disp: 180 capsule, Rfl: 0   chlorpheniramine-HYDROcodone (TUSSIONEX PENNKINETIC ER) 10-8 MG/5ML SUER, Take 5 mLs by mouth every 12 (twelve) hours as needed for cough., Disp: 70 mL, Rfl: 0   Cholecalciferol (HM VITAMIN D3) 2000 UNITS CAPS, Take 1 capsule by mouth daily., Disp: , Rfl:    cyanocobalamin (,VITAMIN B-12,) 1000 MCG/ML injection, Inject 1 ml intrumuscularly daily  x 3, then weekly x 4, then monthly thereafter, Disp: 10 mL, Rfl: 2   docusate sodium (COLACE) 100 MG capsule, Take 1 capsule (100 mg total) by mouth 2 (two) times daily., Disp: 180 capsule, Rfl: 1   famotidine (PEPCID) 20 MG tablet, TAKE 1 TABLET(20 MG) BY MOUTH AT BEDTIME, Disp: 30 tablet, Rfl: 0   Ferrous Sulfate (IRON) 325 (65 Fe) MG TABS, TAKE 1 TABLET BY MOUTH EVERY DAY WITH BREAKFAST, Disp: 90 tablet, Rfl: 0   FLUoxetine (PROZAC) 20 MG capsule, TAKE 1 CAPSULE(20 MG) BY MOUTH DAILY, Disp: 90 capsule, Rfl: 1   fluticasone (FLONASE) 50 MCG/ACT nasal spray, SHAKE LIQUID AND USE 2 SPRAYS IN EACH NOSTRIL EVERY DAY, Disp: 48 g, Rfl: 1   levocetirizine (XYZAL) 5 MG tablet, Take 1 tablet (5 mg total) by mouth every evening., Disp: 90 tablet, Rfl: 1   meclizine (ANTIVERT) 12.5 MG tablet, Take 1 tablet (12.5 mg total) by mouth 3 (three) times daily as needed for dizziness., Disp: 30 tablet, Rfl: 0   Misc Natural Products (OSTEO BI-FLEX JOINT SHIELD PO), Take 1 tablet by mouth daily., Disp: , Rfl:    Mometasone Furoate (ASMANEX HFA) 200 MCG/ACT AERO, Inhale 2 puffs into the lungs 2 (two) times daily. Rinse mouth after use., Disp: 3 Inhaler, Rfl: 3   montelukast (SINGULAIR) 10 MG tablet, Take 1 tablet (10 mg total) by mouth at bedtime., Disp: 90 tablet, Rfl: 1   pantoprazole (PROTONIX) 40 MG tablet, Take 1 tablet (40 mg total) by mouth 2 (two) times daily., Disp: 180 tablet, Rfl: 1   rosuvastatin (CRESTOR) 5 MG tablet, Take 1 tablet (5 mg total) by mouth daily., Disp: 90 tablet, Rfl: 3   Syringe, Disposable, 1 ML MISC, For use with B12 injections, Disp: 25 each, Rfl: 1   Allergies:  Tylox [oxycodone-acetaminophen]  Review of Systems:  Constitutional: Feels well. Cardiovascular: Denies chest pain, exertional chest pain.  Pulmonary: Denies hemoptysis, pleuritic chest pain.   The remainder of systems were reviewed and were found to be negative other than what is documented in the HPI.     Physical Examination:   VS: BP 126/70 (BP Location: Left Arm, Cuff Size: Normal)    Pulse 71    Temp 98.8 F (37.1 C) (Skin)    Ht 5\' 2"  (1.575 m)    Wt 138 lb (62.6 kg)    SpO2 98%    BMI 25.24 kg/m   General Appearance: No distress  Neuro:without focal findings, mental status, speech normal, alert and oriented HEENT: PERRLA, EOM intact Pulmonary: No wheezing, No rales  CardiovascularNormal S1,S2.  No m/r/g.  Abdomen: Benign, Soft, non-tender, No masses Renal:  No costovertebral tenderness  GU:  No performed at this time. Endoc: No evident thyromegaly, no signs of acromegaly or Cushing features Skin:   warm, no rashes, no ecchymosis  Extremities: normal, no cyanosis, clubbing.      LABORATORY PANEL:   CBC No results  for input(s): WBC, HGB, HCT, PLT in the last 168 hours. ------------------------------------------------------------------------------------------------------------------  Chemistries  No results for input(s): NA, K, CL, CO2, GLUCOSE, BUN, CREATININE, CALCIUM, MG, AST, ALT, ALKPHOS, BILITOT in the last 168 hours.  Invalid input(s): GFRCGP ------------------------------------------------------------------------------------------------------------------  Cardiac Enzymes No results for input(s): TROPONINI in the last 168 hours. ------------------------------------------------------------  RADIOLOGY:  No results found.     Thank  you for the consultation and for allowing [facility] to assist in the care of your patient. Our recommendations are noted above.  Please contact us if we can be of further service.  Marda Stalker, M.D., F.C.C.P.  Board Certified in Internal Medicine, Pulmonary Medicine, [place], and Sleep Medicine.  [facility] Office Number: [phone]  05/11/2019

## 2019-05-11 NOTE — Telephone Encounter (Signed)
Pt is scheduled for a fasting lab appt. Pt is aware of appt date and time.

## 2019-05-12 ENCOUNTER — Ambulatory Visit: Payer: PPO | Admitting: Internal Medicine

## 2019-05-12 ENCOUNTER — Other Ambulatory Visit: Payer: Self-pay

## 2019-05-12 ENCOUNTER — Encounter: Payer: Self-pay | Admitting: Internal Medicine

## 2019-05-12 ENCOUNTER — Ambulatory Visit (INDEPENDENT_AMBULATORY_CARE_PROVIDER_SITE_OTHER): Payer: PPO

## 2019-05-12 VITALS — BP 126/70 | HR 71 | Temp 98.8°F | Ht 62.0 in | Wt 138.0 lb

## 2019-05-12 DIAGNOSIS — R05 Cough: Secondary | ICD-10-CM | POA: Diagnosis not present

## 2019-05-12 DIAGNOSIS — R059 Cough, unspecified: Secondary | ICD-10-CM

## 2019-05-12 DIAGNOSIS — E538 Deficiency of other specified B group vitamins: Secondary | ICD-10-CM | POA: Diagnosis not present

## 2019-05-12 MED ORDER — CYANOCOBALAMIN 1000 MCG/ML IJ SOLN
1000.0000 ug | Freq: Once | INTRAMUSCULAR | Status: AC
  Administered 2019-05-12: 1000 ug via INTRAMUSCULAR

## 2019-05-12 MED ORDER — BENZONATATE 100 MG PO CAPS: 200.0000 mg | ORAL_CAPSULE | Freq: Three times a day (TID) | ORAL | 3 refills | Status: DC

## 2019-05-12 NOTE — Patient Instructions (Signed)
You can try stopping asmanex inhaler for 1 month to see if it makes a difference.  Continue tessalon.

## 2019-05-12 NOTE — Progress Notes (Addendum)
Patient presented today for B12 injection per MD order.  Administered IM in left deltoid.  Administered without incident.  Patient tolerated well with no signs of distress.  Reviewed.  Dr Nicki Reaper

## 2019-05-28 ENCOUNTER — Ambulatory Visit
Admission: RE | Admit: 2019-05-28 | Discharge: 2019-05-28 | Disposition: A | Discharge status: Home / Self Care | Payer: PPO | Source: Ambulatory Visit | Attending: Internal Medicine | Admitting: Internal Medicine

## 2019-05-28 ENCOUNTER — Other Ambulatory Visit: Payer: Self-pay

## 2019-05-28 DIAGNOSIS — Z1231 Encounter for screening mammogram for malignant neoplasm of breast: Secondary | ICD-10-CM | POA: Insufficient documentation

## 2019-06-06 ENCOUNTER — Other Ambulatory Visit: Payer: Self-pay | Admitting: Internal Medicine

## 2019-06-10 ENCOUNTER — Other Ambulatory Visit: Payer: Self-pay

## 2019-06-10 MED ORDER — LEVOCETIRIZINE DIHYDROCHLORIDE 5 MG PO TABS: 5.0000 mg | ORAL_TABLET | Freq: Every evening | ORAL | 1 refills | Status: DC

## 2019-06-16 ENCOUNTER — Other Ambulatory Visit (INDEPENDENT_AMBULATORY_CARE_PROVIDER_SITE_OTHER): Payer: PPO

## 2019-06-16 ENCOUNTER — Ambulatory Visit (INDEPENDENT_AMBULATORY_CARE_PROVIDER_SITE_OTHER): Payer: PPO

## 2019-06-16 ENCOUNTER — Other Ambulatory Visit: Payer: Self-pay

## 2019-06-16 DIAGNOSIS — D508 Other iron deficiency anemias: Secondary | ICD-10-CM | POA: Diagnosis not present

## 2019-06-16 DIAGNOSIS — E538 Deficiency of other specified B group vitamins: Secondary | ICD-10-CM

## 2019-06-16 DIAGNOSIS — E611 Iron deficiency: Secondary | ICD-10-CM | POA: Diagnosis not present

## 2019-06-16 DIAGNOSIS — E785 Hyperlipidemia, unspecified: Secondary | ICD-10-CM | POA: Diagnosis not present

## 2019-06-16 LAB — LIPID PANEL
Cholesterol: 160 mg/dL (ref 0–200)
HDL: 60 mg/dL (ref >39.00)
LDL Cholesterol: 79 mg/dL (ref 0–99)
NonHDL: 99.8
Total CHOL/HDL Ratio: 3
Triglycerides: 103 mg/dL (ref 0.0–149.0)
VLDL: 20.6 mg/dL (ref 0.0–40.0)

## 2019-06-16 LAB — CBC WITH DIFFERENTIAL/PLATELET
Basophils Absolute: 0 10*3/uL (ref 0.0–0.1)
Basophils Relative: 0.7 % (ref 0.0–3.0)
Eosinophils Absolute: 0.2 10*3/uL (ref 0.0–0.7)
Eosinophils Relative: 3.9 % (ref 0.0–5.0)
HCT: 38.5 % (ref 36.0–46.0)
Hemoglobin: 12.8 g/dL (ref 12.0–15.0)
Lymphocytes Relative: 32 % (ref 12.0–46.0)
Lymphs Abs: 1.4 10*3/uL (ref 0.7–4.0)
MCHC: 33.1 g/dL (ref 30.0–36.0)
MCV: 85.3 fl (ref 78.0–100.0)
Monocytes Absolute: 0.4 10*3/uL (ref 0.1–1.0)
Monocytes Relative: 8.8 % (ref 3.0–12.0)
Neutro Abs: 2.5 10*3/uL (ref 1.4–7.7)
Neutrophils Relative %: 54.6 % (ref 43.0–77.0)
Platelets: 192 10*3/uL (ref 150.0–400.0)
RBC: 4.52 Mil/uL (ref 3.87–5.11)
RDW: 15 % (ref 11.5–15.5)
WBC: 4.5 10*3/uL (ref 4.0–10.5)

## 2019-06-16 LAB — COMPREHENSIVE METABOLIC PANEL
ALT: 9 U/L (ref 0–35)
AST: 11 U/L (ref 0–37)
Albumin: 4.1 g/dL (ref 3.5–5.2)
Alkaline Phosphatase: 73 U/L (ref 39–117)
BUN: 12 mg/dL (ref 6–23)
CO2: 30 mEq/L (ref 19–32)
Calcium: 9.1 mg/dL (ref 8.4–10.5)
Chloride: 104 mEq/L (ref 96–112)
Creatinine, Ser: 0.88 mg/dL (ref 0.40–1.20)
GFR: 63.14 mL/min (ref >60.00)
Glucose, Bld: 94 mg/dL (ref 70–99)
Potassium: 4.4 mEq/L (ref 3.5–5.1)
Sodium: 143 mEq/L (ref 135–145)
Total Bilirubin: 0.4 mg/dL (ref 0.2–1.2)
Total Protein: 6.4 g/dL (ref 6.0–8.3)

## 2019-06-16 LAB — VITAMIN B12: Vitamin B-12: 429 pg/mL (ref 211–911)

## 2019-06-16 MED ORDER — CYANOCOBALAMIN 1000 MCG/ML IJ SOLN
1000.0000 ug | Freq: Once | INTRAMUSCULAR | Status: AC
  Administered 2019-06-16: 1000 ug via INTRAMUSCULAR

## 2019-06-16 NOTE — Progress Notes (Addendum)
Patient presented today for B12 injection.  Administered IM in right deltoid.  Patient tolerated well with no signs of distress.  Reviewed.  Dr Scott    

## 2019-06-17 ENCOUNTER — Telehealth: Payer: Self-pay | Admitting: Internal Medicine

## 2019-06-17 LAB — IRON,TIBC AND FERRITIN PANEL
%SAT: 15 % (calc) — ABNORMAL LOW (ref 16–45)
Ferritin: 23 ng/mL (ref 16–288)
Iron: 54 ug/dL (ref 45–160)
TIBC: 357 mcg/dL (calc) (ref 250–450)

## 2019-06-17 MED ORDER — BENZONATATE 100 MG PO CAPS: 200.0000 mg | ORAL_CAPSULE | Freq: Three times a day (TID) | ORAL | 3 refills | Status: DC

## 2019-06-17 NOTE — Telephone Encounter (Signed)
Called and spoke to pt, who is requesting refills on Tessalon 100mg .  Rx has been sent to preferred pharmacy, as pt was instructed to continue this medication at last ov.  Nothing further is needed.

## 2019-06-19 ENCOUNTER — Other Ambulatory Visit: Payer: Self-pay

## 2019-06-23 ENCOUNTER — Other Ambulatory Visit: Payer: Self-pay

## 2019-06-23 ENCOUNTER — Ambulatory Visit (INDEPENDENT_AMBULATORY_CARE_PROVIDER_SITE_OTHER): Payer: PPO | Admitting: Internal Medicine

## 2019-06-23 ENCOUNTER — Encounter: Payer: Self-pay | Admitting: Internal Medicine

## 2019-06-23 VITALS — BP 108/66 | HR 69 | Temp 98.4°F | Resp 15 | Ht 62.0 in | Wt 136.8 lb

## 2019-06-23 DIAGNOSIS — I700 Atherosclerosis of aorta: Secondary | ICD-10-CM | POA: Diagnosis not present

## 2019-06-23 DIAGNOSIS — R05 Cough: Secondary | ICD-10-CM | POA: Diagnosis not present

## 2019-06-23 DIAGNOSIS — E785 Hyperlipidemia, unspecified: Secondary | ICD-10-CM

## 2019-06-23 DIAGNOSIS — Z0000 Encounter for general adult medical examination without abnormal findings: Secondary | ICD-10-CM | POA: Diagnosis not present

## 2019-06-23 DIAGNOSIS — R053 Chronic cough: Secondary | ICD-10-CM

## 2019-06-23 MED ORDER — BENZONATATE 200 MG PO CAPS: 200.0000 mg | ORAL_CAPSULE | Freq: Three times a day (TID) | ORAL | 3 refills | Status: DC

## 2019-06-23 NOTE — Patient Instructions (Signed)
Health Maintenance After Age 72 After age 72, you are at a higher risk for certain long-term diseases and infections as well as injuries from falls. Falls are a major cause of broken bones and head injuries in people who are older than age 72. Getting regular preventive care can help to keep you healthy and well. Preventive care includes getting regular testing and making lifestyle changes as recommended by your health care provider. Talk with your health care provider about:  Which screenings and tests you should have. A screening is a test that checks for a disease when you have no symptoms.  A diet and exercise plan that is right for you. What should I know about screenings and tests to prevent falls? Screening and testing are the best ways to find a health problem early. Early diagnosis and treatment give you the best chance of managing medical conditions that are common after age 72. Certain conditions and lifestyle choices may make you more likely to have a fall. Your health care provider may recommend:  Regular vision checks. Poor vision and conditions such as cataracts can make you more likely to have a fall. If you wear glasses, make sure to get your prescription updated if your vision changes.  Medicine review. Work with your health care provider to regularly review all of the medicines you are taking, including over-the-counter medicines. Ask your health care provider about any side effects that may make you more likely to have a fall. Tell your health care provider if any medicines that you take make you feel dizzy or sleepy.  Osteoporosis screening. Osteoporosis is a condition that causes the bones to get weaker. This can make the bones weak and cause them to break more easily.  Blood pressure screening. Blood pressure changes and medicines to control blood pressure can make you feel dizzy.  Strength and balance checks. Your health care provider may recommend certain tests to check your  strength and balance while standing, walking, or changing positions.  Foot health exam. Foot pain and numbness, as well as not wearing proper footwear, can make you more likely to have a fall.  Depression screening. You may be more likely to have a fall if you have a fear of falling, feel emotionally low, or feel unable to do activities that you used to do.  Alcohol use screening. Using too much alcohol can affect your balance and may make you more likely to have a fall. What actions can I take to lower my risk of falls? General instructions  Talk with your health care provider about your risks for falling. Tell your health care provider if: ? You fall. Be sure to tell your health care provider about all falls, even ones that seem minor. ? You feel dizzy, sleepy, or off-balance.  Take over-the-counter and prescription medicines only as told by your health care provider. These include any supplements.  Eat a healthy diet and maintain a healthy weight. A healthy diet includes low-fat dairy products, low-fat (lean) meats, and fiber from whole grains, beans, and lots of fruits and vegetables. Home safety  Remove any tripping hazards, such as rugs, cords, and clutter.  Install safety equipment such as grab bars in bathrooms and safety rails on stairs.  Keep rooms and walkways well-lit. Activity   Follow a regular exercise program to stay fit. This will help you maintain your balance. Ask your health care provider what types of exercise are appropriate for you.  If you need a cane or   walker, use it as recommended by your health care provider.  Wear supportive shoes that have nonskid soles. Lifestyle  Do not drink alcohol if your health care provider tells you not to drink.  If you drink alcohol, limit how much you have: ? 0-1 drink a day for women. ? 0-2 drinks a day for men.  Be aware of how much alcohol is in your drink. In the [place] one drink equals one typical bottle of beer (12  oz), one-half glass of wine (5 oz), or one shot of hard liquor (1 oz).  Do not use any products that contain nicotine or tobacco, such as cigarettes and e-cigarettes. If you need help quitting, ask your health care provider. Summary  Having a healthy lifestyle and getting preventive care can help to protect your health and wellness after age 72.  Screening and testing are the best way to find a health problem early and help you avoid having a fall. Early diagnosis and treatment give you the best chance for managing medical conditions that are more common for people who are older than age 72.  Falls are a major cause of broken bones and head injuries in people who are older than age 72. Take precautions to prevent a fall at home.  Work with your health care provider to learn what changes you can make to improve your health and wellness and to prevent falls. This information is not intended to replace advice given to you by your health care provider. Make sure you discuss any questions you have with your health care provider. Document Released: 09/11/2017 Document Revised: 02/19/2019 Document Reviewed: 09/11/2017 [employer] Patient Education  2020 [employer]  

## 2019-06-23 NOTE — Progress Notes (Signed)
Patient ID: Nancy Hamilton, female    DOB: Nov 21, 1945  Age: 73 y.o. MRN: [id number]  The patient is here for follow up and management of other chronic and acute problems including GAD, hyperlipidemia,   Cervical disc disease with radiculopathy  mammogram  normal July 2020  Colonoscopy 2018 5 yr follow uprecommended.   The risk factors are reflected in the social history.  The roster of all physicians providing medical care to patient - is listed in the Snapshot section of the chart.  Activities of daily living:  The patient is 100% independent in all ADLs: dressing, toileting, feeding as well as independent mobility  Home safety : The patient has smoke detectors in the home. They wear seatbelts.  There are no firearms at home. There is no violence in the home.   There is no risks for hepatitis, STDs or HIV. There is no   history of blood transfusion. They have no travel history to infectious disease endemic areas of the world.  The patient has seen their dentist in the last six month. They have seen their eye doctor in the last year. They admit to slight hearing difficulty with regard to whispered voices and some television programs.  They have deferred audiologic testing in the last year.  They do not  have excessive sun exposure. Discussed the need for sun protection: hats, long sleeves and use of sunscreen if there is significant sun exposure.   Diet: the importance of a healthy diet is discussed. They do have a healthy diet.  The benefits of regular aerobic exercise were discussed. She walks 4 times per week ,  20 minutes.   Depression screen: there are no signs or vegative symptoms of depression- irritability, change in appetite, anhedonia, sadness/tearfullness.  Cognitive assessment: the patient manages all their financial and personal affairs and is actively engaged. They could relate day,date,year and events; recalled 2/3 objects at 3 minutes; performed clock-face test  normally.  The following portions of the patient's history were reviewed and updated as appropriate: allergies, current medications, past family history, past medical history,  past surgical history, past social history  and problem list.  Visual acuity was not assessed per patient preference since she has regular follow up with her ophthalmologist. Hearing and body mass index were assessed and reviewed.   During the course of the visit the patient was educated and counseled about appropriate screening and preventive services including : fall prevention , diabetes screening, nutrition counseling, colorectal cancer screening, and recommended immunizations.    CC: The primary encounter diagnosis was Hyperlipidemia LDL goal <160. Diagnoses of Chronic cough, Visit for preventive health examination, and Thoracic aortic atherosclerosis ([place]) were also pertinent to this visit.   Cc: new onset fatigue.  sleeping until 2 pm in the afternoon on Saturdays  , but staying up until 2 am to watch movies.  Works 5-6 days per week at a [employer], hard physical labor.  Able to rise early the other 6 days of the week.  Has intermittent bilateral  hand tingling but not pain .  Symptoms are aggravated by heavy lifting. Tries to avoid lifting 5 gal buckets most of the time    B12/iron deficiency anemia   Cough has improved but she is taking  Tessalon  200 mg  q 8 hrs and insurance does not pay.  Had  No relief with asmanex    History Nancy Hamilton has a past medical history of Allergy, Anemia, Anxiety, Arthritis, Cancer ([place]), Cough, Depression,  GERD (gastroesophageal reflux disease), and Hyperlipidemia.   She has a past surgical history that includes Back surgery (1980); Cataract extraction w/PHACO (Right, 11/28/2015); Colonoscopy; Cataract extraction w/PHACO (Left, 12/19/2015); Colonoscopy with propofol (N/A, 10/10/2017); and polypectomy (10/10/2017).   Her family history includes Breast cancer (age of onset: 48) in  her maternal aunt; Diabetes in her brother and paternal grandmother; Fibromyalgia in her sister; Heart disease in her brother; Hyperlipidemia in her sister; Hypertension in her brother.She reports that she quit smoking about 26 years ago. She has never used smokeless tobacco. She reports that she does not drink alcohol or use drugs.  Outpatient Medications Prior to Visit  Medication Sig Dispense Refill  . Ascorbic Acid (VITAMIN C) 1000 MG tablet Take 1,000 mg by mouth daily.    . Cholecalciferol (HM VITAMIN D3) 2000 UNITS CAPS Take 1 capsule by mouth daily.    . cyanocobalamin (,VITAMIN B-12,) 1000 MCG/ML injection Inject 1 ml intrumuscularly daily x 3, then weekly x 4, then monthly thereafter 10 mL 2  . docusate sodium (COLACE) 100 MG capsule Take 1 capsule (100 mg total) by mouth 2 (two) times daily. 180 capsule 1  . Ferrous Sulfate (IRON) 325 (65 Fe) MG TABS TAKE 1 TABLET BY MOUTH EVERY DAY WITH BREAKFAST 90 tablet 0  . FLUoxetine (PROZAC) 20 MG capsule TAKE 1 CAPSULE(20 MG) BY MOUTH DAILY 90 capsule 1  . fluticasone (FLONASE) 50 MCG/ACT nasal spray SHAKE LIQUID AND USE 2 SPRAYS IN EACH NOSTRIL EVERY DAY 48 g 1  . levocetirizine (XYZAL) 5 MG tablet Take 1 tablet (5 mg total) by mouth every evening. 90 tablet 1  . meclizine (ANTIVERT) 12.5 MG tablet Take 1 tablet (12.5 mg total) by mouth 3 (three) times daily as needed for dizziness. 30 tablet 0  . Misc Natural Products (OSTEO BI-FLEX JOINT SHIELD PO) Take 1 tablet by mouth daily.    . montelukast (SINGULAIR) 10 MG tablet Take 1 tablet (10 mg total) by mouth at bedtime. 90 tablet 1  . pantoprazole (PROTONIX) 40 MG tablet Take 1 tablet (40 mg total) by mouth 2 (two) times daily. 180 tablet 1  . rosuvastatin (CRESTOR) 5 MG tablet Take 1 tablet (5 mg total) by mouth daily. 90 tablet 3  . Syringe, Disposable, 1 ML MISC For use with B12 injections 25 each 1  . benzonatate (TESSALON) 100 MG capsule Take 2 capsules (200 mg total) by mouth 3 (three)  times daily. 180 capsule 3  . famotidine (PEPCID) 20 MG tablet TAKE 1 TABLET(20 MG) BY MOUTH AT BEDTIME (Patient not taking: Reported on 06/23/2019) 30 tablet 0  . Mometasone Furoate (ASMANEX HFA) 200 MCG/ACT AERO Inhale 2 puffs into the lungs 2 (two) times daily. Rinse mouth after use. (Patient not taking: Reported on 06/23/2019) 3 Inhaler 3   No facility-administered medications prior to visit.     Review of Systems  Patient denies headache, fevers, malaise, unintentional weight loss, skin rash, eye pain, sinus congestion and sinus pain, sore throat, dysphagia,  hemoptysis , cough, dyspnea, wheezing, chest pain, palpitations, orthopnea, edema, abdominal pain, nausea, melena, diarrhea, constipation, flank pain, dysuria, hematuria, urinary  Frequency, nocturia, numbness, tingling, seizures,  Focal weakness, Loss of consciousness,  Tremor, insomnia, depression, anxiety, and suicidal ideation.     Objective:  BP 108/66 (BP Location: Left Arm, Patient Position: Sitting, Cuff Size: Normal)   Pulse 69   Temp 98.4 F (36.9 C) (Oral)   Resp 15   Ht 5\' 2"  (1.575 m)   Wt  136 lb 12.8 oz (62.1 kg)   SpO2 97%   BMI 25.02 kg/m   Physical Exam   General appearance: alert, cooperative and appears stated age Head: Normocephalic, without obvious abnormality, atraumatic Eyes: conjunctivae/corneas clear. PERRL, EOM's intact. Fundi benign. Ears: normal TM's and external ear canals both ears Nose: Nares normal. Septum midline. Mucosa normal. No drainage or sinus tenderness. Throat: lips, mucosa, and tongue normal; teeth and gums normal Neck: no adenopathy, no carotid bruit, no JVD, supple, symmetrical, trachea midline and thyroid not enlarged, symmetric, no tenderness/mass/nodules Lungs: clear to auscultation bilaterally Breasts: normal appearance, no masses or tenderness Heart: regular rate and rhythm, S1, S2 normal, no murmur, click, rub or gallop Abdomen: soft, non-tender; bowel sounds normal; no  masses,  no organomegaly Extremities: extremities normal, atraumatic, no cyanosis or edema Pulses: 2+ and symmetric Skin: Skin color, texture, turgor indicative os chronic sun damage.  Neurologic: Alert and oriented X 3, normal strength and tone. Normal symmetric reflexes. Normal coordination and gait.      Assessment & Plan:   Problem List Items Addressed This Visit      Unprioritized   Chronic cough    She has been treated for allergic rhinitis and GERD without improvement. She has a hiatal hernia. She has mild asthma by PFTS done in 2018.  No prior esophageal biopsy to rule out EE.  Chest x ray normal except early ILD at bases vs chronic bronchitic changes (per pulmonology) and  Aortic atherosclerosis.  Has been seen by Pulmonology and trial of asmanex did not help.  Cough is controlled with tessalon perles 200 mg tid  Refills given; she does not want  Bronchoscopy or CT chest at this time.  Offered EGd,  She prefers to schedule with next colonoscopy; does not want to undergo procedure  sooner.       Hyperlipidemia LDL goal <100 - Primary    Mild, tolerating Crestor 5 mg due to incidental finding of aortic atherosclerosis  LFT normal.   Lab Results  Component Value Date   CHOL 160 06/16/2019   HDL 60.00 06/16/2019   LDLCALC 79 06/16/2019   TRIG 103.0 06/16/2019   CHOLHDL 3 06/16/2019           Visit for preventive health examination    age appropriate education and counseling updated, referrals for preventative services and immunizations addressed, dietary and smoking counseling addressed, most recent labs reviewed.  I have personally reviewed and have noted:  1) the patient's medical and social history 2) The pt's use of alcohol, tobacco, and illicit drugs 3) The patient's current medications and supplements 4) Functional ability including ADL's, fall risk, home safety risk, hearing and visual impairment 5) Diet and physical activities 6) Evidence for depression or mood  disorder 7) The patient's height, weight, and BMI have been recorded in the chart  I have made referrals, and provided counseling and education based on review of the above      Thoracic aortic atherosclerosis ([place])    Noted on chest x ray.   Tolerating rosuvastatin as preventive ; LDL now < 100 Lab Results  Component Value Date   CHOL 160 06/16/2019   HDL 60.00 06/16/2019   LDLCALC 79 06/16/2019   TRIG 103.0 06/16/2019   CHOLHDL 3 06/16/2019            I have discontinued [facility]. Woodson's Mometasone Furoate and famotidine. I have also changed her benzonatate. Additionally, I am having her maintain her Cholecalciferol, Misc Natural Products (  OSTEO BI-FLEX JOINT SHIELD PO), cyanocobalamin, Syringe (Disposable), meclizine, docusate sodium, fluticasone, pantoprazole, rosuvastatin, Iron, FLUoxetine, montelukast, levocetirizine, and vitamin C.  Meds ordered this encounter  Medications  . benzonatate (TESSALON) 200 MG capsule    Sig: Take 1 capsule (200 mg total) by mouth 3 (three) times daily.    Dispense:  90 capsule    Refill:  3    Medications Discontinued During This Encounter  Medication Reason  . famotidine (PEPCID) 20 MG tablet Error  . Mometasone Furoate (ASMANEX HFA) 200 MCG/ACT AERO Discontinued by provider  . benzonatate (TESSALON) 100 MG capsule Reorder    Follow-up: No follow-ups on file.   Crecencio Mc, MD

## 2019-06-24 NOTE — Assessment & Plan Note (Signed)
Noted on chest x ray.   Tolerating rosuvastatin as preventive ; LDL now < 100 Lab Results  Component Value Date   CHOL 160 06/16/2019   HDL 60.00 06/16/2019   LDLCALC 79 06/16/2019   TRIG 103.0 06/16/2019   CHOLHDL 3 06/16/2019

## 2019-06-24 NOTE — Assessment & Plan Note (Addendum)
Mild, tolerating Crestor 5 mg due to incidental finding of aortic atherosclerosis  LFT normal.   Lab Results  Component Value Date   CHOL 160 06/16/2019   HDL 60.00 06/16/2019   LDLCALC 79 06/16/2019   TRIG 103.0 06/16/2019   CHOLHDL 3 06/16/2019

## 2019-06-24 NOTE — Assessment & Plan Note (Addendum)
She has been treated for allergic rhinitis and GERD without improvement. She has a hiatal hernia. She has mild asthma by PFTS done in 2018.  No prior esophageal biopsy to rule out EE.  Chest x ray normal except early ILD at bases vs chronic bronchitic changes (per pulmonology) and  Aortic atherosclerosis.  Has been seen by Pulmonology and trial of asmanex did not help.  Cough is controlled with tessalon perles 200 mg tid  Refills given; she does not want  Bronchoscopy or CT chest at this time.  Offered EGd,  She prefers to schedule with next colonoscopy; does not want to undergo procedure  sooner.

## 2019-06-24 NOTE — Assessment & Plan Note (Signed)

## 2019-07-02 ENCOUNTER — Telehealth: Payer: Self-pay

## 2019-07-02 ENCOUNTER — Ambulatory Visit: Payer: PPO

## 2019-07-02 NOTE — Telephone Encounter (Signed)
Called patient per appointment note as requested by care guide at 1:15. No answer, unable to leave message. Please reschedule annual wellness and health maintenance updates as appropriate with nurse health advisor.

## 2019-07-06 ENCOUNTER — Other Ambulatory Visit: Payer: Self-pay

## 2019-07-06 MED ORDER — FLUTICASONE PROPIONATE 50 MCG/ACT NA SUSP: NASAL | 1 refills | Status: DC

## 2019-07-22 ENCOUNTER — Other Ambulatory Visit: Payer: Self-pay

## 2019-07-22 ENCOUNTER — Ambulatory Visit (INDEPENDENT_AMBULATORY_CARE_PROVIDER_SITE_OTHER): Payer: PPO | Admitting: *Deleted

## 2019-07-22 DIAGNOSIS — E538 Deficiency of other specified B group vitamins: Secondary | ICD-10-CM

## 2019-07-22 MED ORDER — CYANOCOBALAMIN 1000 MCG/ML IJ SOLN
1000.0000 ug | Freq: Once | INTRAMUSCULAR | Status: AC
  Administered 2019-07-22: 1000 ug via INTRAMUSCULAR

## 2019-07-22 NOTE — Progress Notes (Signed)
Patient presented for B 12 injection to left deltoid, patient voiced no concerns nor showed any signs of distress during injection. 

## 2019-08-25 ENCOUNTER — Ambulatory Visit (INDEPENDENT_AMBULATORY_CARE_PROVIDER_SITE_OTHER): Payer: PPO

## 2019-08-25 ENCOUNTER — Other Ambulatory Visit: Payer: Self-pay

## 2019-08-25 DIAGNOSIS — Z23 Encounter for immunization: Secondary | ICD-10-CM | POA: Diagnosis not present

## 2019-08-25 DIAGNOSIS — E538 Deficiency of other specified B group vitamins: Secondary | ICD-10-CM | POA: Diagnosis not present

## 2019-08-25 MED ORDER — CYANOCOBALAMIN 1000 MCG/ML IJ SOLN
1000.0000 ug | Freq: Once | INTRAMUSCULAR | Status: AC
  Administered 2019-08-25: 08:00:00 1000 ug via INTRAMUSCULAR

## 2019-08-25 NOTE — Progress Notes (Addendum)
Patient presented today for B12 injection.  Administered IM in the right deltoid.    Patient also received flu shot today in left deltoid.  Patient tolerated well with no signs of distress.   Reviewed.   Dr Nicki Reaper

## 2019-09-02 DIAGNOSIS — D0471 Carcinoma in situ of skin of right lower limb, including hip: Secondary | ICD-10-CM | POA: Diagnosis not present

## 2019-09-02 DIAGNOSIS — X32XXXA Exposure to sunlight, initial encounter: Secondary | ICD-10-CM | POA: Diagnosis not present

## 2019-09-02 DIAGNOSIS — D225 Melanocytic nevi of trunk: Secondary | ICD-10-CM | POA: Diagnosis not present

## 2019-09-02 DIAGNOSIS — D485 Neoplasm of uncertain behavior of skin: Secondary | ICD-10-CM | POA: Diagnosis not present

## 2019-09-02 DIAGNOSIS — D2271 Melanocytic nevi of right lower limb, including hip: Secondary | ICD-10-CM | POA: Diagnosis not present

## 2019-09-02 DIAGNOSIS — L570 Actinic keratosis: Secondary | ICD-10-CM | POA: Diagnosis not present

## 2019-09-02 DIAGNOSIS — D2261 Melanocytic nevi of right upper limb, including shoulder: Secondary | ICD-10-CM | POA: Diagnosis not present

## 2019-09-02 DIAGNOSIS — D2262 Melanocytic nevi of left upper limb, including shoulder: Secondary | ICD-10-CM | POA: Diagnosis not present

## 2019-09-02 DIAGNOSIS — Z85828 Personal history of other malignant neoplasm of skin: Secondary | ICD-10-CM | POA: Diagnosis not present

## 2019-09-07 ENCOUNTER — Telehealth: Payer: Self-pay

## 2019-09-07 NOTE — Telephone Encounter (Signed)
I RECOMMEND DR Derrill Kay

## 2019-09-07 NOTE — Telephone Encounter (Signed)
Copied from [place] [id number]. Topic: General - Other >> Sep 07, 2019 10:54 AM Keene Breath wrote: Reason for CRM: Patient called to ask Dr. Derrel Nip which specialist she would recommend at the [facility] office.  Please advise and call to discuss.  CB# [phone]

## 2019-09-08 ENCOUNTER — Other Ambulatory Visit: Payer: Self-pay

## 2019-09-08 MED ORDER — IRON 325 (65 FE) MG PO TABS: ORAL_TABLET | ORAL | 0 refills | Status: DC

## 2019-09-08 NOTE — Telephone Encounter (Signed)
LMTCB

## 2019-09-23 NOTE — Telephone Encounter (Signed)
Patient has been advised of message below 

## 2019-09-28 ENCOUNTER — Ambulatory Visit (INDEPENDENT_AMBULATORY_CARE_PROVIDER_SITE_OTHER): Payer: PPO | Admitting: *Deleted

## 2019-09-28 ENCOUNTER — Other Ambulatory Visit: Payer: Self-pay

## 2019-09-28 DIAGNOSIS — E538 Deficiency of other specified B group vitamins: Secondary | ICD-10-CM | POA: Diagnosis not present

## 2019-09-28 MED ORDER — CYANOCOBALAMIN 1000 MCG/ML IJ SOLN
1000.0000 ug | Freq: Once | INTRAMUSCULAR | Status: AC
  Administered 2019-09-28: 1000 ug via INTRAMUSCULAR

## 2019-09-28 NOTE — Progress Notes (Signed)
Patient presented for B 12 injection to right deltoid, patient voiced no concerns nor showed any signs of distress during injection. 

## 2019-10-06 ENCOUNTER — Other Ambulatory Visit: Payer: Self-pay

## 2019-10-06 MED ORDER — DOCUSATE SODIUM 100 MG PO CAPS: 100.0000 mg | ORAL_CAPSULE | Freq: Two times a day (BID) | ORAL | 1 refills | Status: DC

## 2019-10-15 ENCOUNTER — Other Ambulatory Visit: Payer: Self-pay

## 2019-10-15 MED ORDER — FLUOXETINE HCL 20 MG PO CAPS: ORAL_CAPSULE | ORAL | 1 refills | Status: DC

## 2019-10-16 ENCOUNTER — Other Ambulatory Visit: Payer: Self-pay | Admitting: *Deleted

## 2019-10-16 DIAGNOSIS — Z23 Encounter for immunization: Secondary | ICD-10-CM

## 2019-10-16 MED ORDER — ZOSTER VAC RECOMB ADJUVANTED 50 MCG/0.5ML IM SUSR: 0.5000 mL | Freq: Once | INTRAMUSCULAR | 0 refills | Status: AC

## 2019-10-16 NOTE — Progress Notes (Signed)
Your put in for Shingrix vaccine.

## 2019-10-20 DIAGNOSIS — D0471 Carcinoma in situ of skin of right lower limb, including hip: Secondary | ICD-10-CM | POA: Diagnosis not present

## 2019-10-28 DIAGNOSIS — H1013 Acute atopic conjunctivitis, bilateral: Secondary | ICD-10-CM | POA: Diagnosis not present

## 2019-11-03 ENCOUNTER — Other Ambulatory Visit: Payer: Self-pay

## 2019-11-03 ENCOUNTER — Ambulatory Visit (INDEPENDENT_AMBULATORY_CARE_PROVIDER_SITE_OTHER): Payer: PPO | Admitting: *Deleted

## 2019-11-03 DIAGNOSIS — E538 Deficiency of other specified B group vitamins: Secondary | ICD-10-CM | POA: Diagnosis not present

## 2019-11-03 MED ORDER — CYANOCOBALAMIN 1000 MCG/ML IJ SOLN
1000.0000 ug | Freq: Once | INTRAMUSCULAR | Status: AC
  Administered 2019-11-03: 10:00:00 1000 ug via INTRAMUSCULAR

## 2019-11-03 NOTE — Progress Notes (Addendum)
Patient presented for B 12 injection to left deltoid, patient voiced no concerns nor showed any signs of distress during injection.  Reviewed.  Dr Scott 

## 2019-11-10 ENCOUNTER — Ambulatory Visit: Payer: PPO | Admitting: Pulmonary Disease

## 2019-11-10 ENCOUNTER — Other Ambulatory Visit: Payer: Self-pay

## 2019-11-10 ENCOUNTER — Encounter: Payer: Self-pay | Admitting: Pulmonary Disease

## 2019-11-10 ENCOUNTER — Other Ambulatory Visit
Admission: RE | Admit: 2019-11-10 | Discharge: 2019-11-10 | Disposition: A | Discharge status: Home / Self Care | Payer: PPO | Source: Ambulatory Visit | Attending: Pulmonary Disease | Admitting: Pulmonary Disease

## 2019-11-10 VITALS — BP 112/68 | HR 73 | Temp 97.9°F | Ht 62.0 in | Wt 138.4 lb

## 2019-11-10 DIAGNOSIS — K219 Gastro-esophageal reflux disease without esophagitis: Secondary | ICD-10-CM

## 2019-11-10 DIAGNOSIS — J45991 Cough variant asthma: Secondary | ICD-10-CM

## 2019-11-10 MED ORDER — AEROCHAMBER MV MISC: 0 refills | Status: AC

## 2019-11-10 MED ORDER — BUDESONIDE-FORMOTEROL FUMARATE 80-4.5 MCG/ACT IN AERO: 2.0000 | INHALATION_SPRAY | Freq: Two times a day (BID) | RESPIRATORY_TRACT | 0 refills | Status: DC

## 2019-11-10 NOTE — Patient Instructions (Addendum)
1.  We will give a trial to Symbicort 80/4.5, 2 inhalations twice a day.  2.  I think you have cough variant asthma.  With this type of asthma instead of wheezing you cough.  3.  Continue your antireflux medications, you may discontinue Singulair  4.  We will see you in follow-up in 4 to 6 weeks time.  Call sooner should any new difficulties arise.

## 2019-11-10 NOTE — Progress Notes (Signed)
 Assessment & Plan:  1. Cough variant asthma (Primary) - IgE; Future  2. Laryngopharyngeal reflux (LPR)   Patient Instructions  1.  We will give a trial to Symbicort  80/4.5, 2 inhalations twice a day.  2.  I think you have cough variant asthma.  With this type of asthma instead of wheezing you cough.  3.  Continue your antireflux medications, you may discontinue Singulair   4.  We will see you in follow-up in 4 to 6 weeks time.  Call sooner should any new difficulties arise.  Please note: late entry documentation due to logistical difficulties during COVID-19 pandemic. This note is filed for information purposes only, and is not intended to be used for billing, nor does it represent the full scope/nature of the visit in question. Please see any associated scanned media linked to date of encounter for additional pertinent information.  Subjective:    HPI: Nancy Hamilton is a 73 y.o. female presenting to the pulmonology clinic on 11/10/2019 with report of: Follow-up (6 month f/u for cough. The cough is dry. Patient denies any SOB, fever, or chills.)     Outpatient Encounter Medications as of 11/10/2019  Medication Sig   Cholecalciferol 50 MCG (2000 UT) CAPS Take 1 capsule by mouth daily.   [DISCONTINUED] Ascorbic Acid (VITAMIN C) 1000 MG tablet Take 1,000 mg by mouth daily. (Patient not taking: Reported on 02/21/2023)   [DISCONTINUED] benzonatate  (TESSALON ) 200 MG capsule Take 1 capsule (200 mg total) by mouth 3 (three) times daily. (Patient not taking: Reported on 07/12/2021)   [DISCONTINUED] cyanocobalamin  (,VITAMIN B-12,) 1000 MCG/ML injection Inject 1 ml intrumuscularly daily x 3, then weekly x 4, then monthly thereafter (Patient not taking: Reported on 09/21/2022)   [DISCONTINUED] docusate sodium  (COLACE) 100 MG capsule Take 1 capsule (100 mg total) by mouth 2 (two) times daily.   [DISCONTINUED] Ferrous Sulfate  (IRON ) 325 (65 Fe) MG TABS TAKE 1 TABLET BY MOUTH EVERY DAY WITH  BREAKFAST   [DISCONTINUED] FLUoxetine  (PROZAC ) 20 MG capsule TAKE 1 CAPSULE(20 MG) BY MOUTH DAILY   [DISCONTINUED] fluticasone  (FLONASE ) 50 MCG/ACT nasal spray SHAKE LIQUID AND USE 2 SPRAYS IN EACH NOSTRIL EVERY DAY   [DISCONTINUED] levocetirizine (XYZAL ) 5 MG tablet Take 1 tablet (5 mg total) by mouth every evening.   [DISCONTINUED] meclizine  (ANTIVERT ) 12.5 MG tablet Take 1 tablet (12.5 mg total) by mouth 3 (three) times daily as needed for dizziness. (Patient not taking: Reported on 09/21/2022)   [DISCONTINUED] Misc Natural Products (OSTEO BI-FLEX JOINT SHIELD PO) Take 1 tablet by mouth daily. (Patient not taking: Reported on 01/22/2022)   [DISCONTINUED] montelukast  (SINGULAIR ) 10 MG tablet Take 1 tablet (10 mg total) by mouth at bedtime.   [DISCONTINUED] pantoprazole  (PROTONIX ) 40 MG tablet Take 1 tablet (40 mg total) by mouth 2 (two) times daily.   [DISCONTINUED] rosuvastatin  (CRESTOR ) 5 MG tablet Take 1 tablet (5 mg total) by mouth daily.   [DISCONTINUED] Syringe, Disposable, 1 ML MISC For use with B12 injections (Patient not taking: Reported on 09/21/2022)   Spacer/Aero-Holding Chambers (AEROCHAMBER MV) inhaler Use as instructed   [DISCONTINUED] budesonide -formoterol  (SYMBICORT ) 80-4.5 MCG/ACT inhaler Inhale 2 puffs into the lungs 2 (two) times daily for 1 day.   No facility-administered encounter medications on file as of 11/10/2019.      Objective:   Vitals:   11/10/19 1125  BP: 112/68  Pulse: 73  Temp: 97.9 F (36.6 C)  Height: 5' 2 (1.575 m)  Weight: 138 lb 6.4 oz (62.8 kg)  SpO2: 99% Comment: ON RA  TempSrc: Temporal  BMI (Calculated): 25.31     Physical exam documentation is limited by delayed entry of information.

## 2019-11-12 LAB — IGE: IgE (Immunoglobulin E), Serum: 42 IU/mL (ref 6–495)

## 2019-11-12 LAB — LATEX, IGE: Latex: <0.1 kU/L

## 2019-12-02 ENCOUNTER — Other Ambulatory Visit: Payer: Self-pay

## 2019-12-04 ENCOUNTER — Other Ambulatory Visit: Payer: Self-pay

## 2019-12-04 ENCOUNTER — Ambulatory Visit (INDEPENDENT_AMBULATORY_CARE_PROVIDER_SITE_OTHER): Payer: PPO

## 2019-12-04 VITALS — Ht 62.0 in | Wt 138.0 lb

## 2019-12-04 DIAGNOSIS — Z0000 Encounter for general adult medical examination without abnormal findings: Secondary | ICD-10-CM

## 2019-12-04 NOTE — Progress Notes (Signed)
Subjective:   Nancy Hamilton is a 74 y.o. female who presents for Medicare Annual (Subsequent) preventive examination.  Review of Systems:  No ROS.  Medicare Wellness Virtual Visit.  Visual/audio telehealth visit, UTA vital signs.   Ht/Wt provided.  See social history for additional risk factors.   Cardiac Risk Factors include: advanced age (>12men, >46 women)     Objective:     Vitals: Ht 5\' 2"  (1.575 m)   Wt 138 lb (62.6 kg)   BMI 25.24 kg/m   Body mass index is 25.24 kg/m.  Advanced Directives 12/04/2019 06/26/2018 10/10/2017 06/25/2017 12/19/2015 11/28/2015  Does Patient Have a Medical Advance Directive? Yes Yes Yes No No No  Type of Paramedic of [place];Living will [place];Living will [place];Living will - - -  Does patient want to make changes to medical advance directive? No - Patient declined No - Patient declined - Yes (MAU/Ambulatory/Procedural Areas - Information given) - -  Copy of [place] in Chart? Yes - validated most recent copy scanned in chart (See row information) No - copy requested No - copy requested - - -  Would patient like information on creating a medical advance directive? - - - - - No - patient declined information    Tobacco Social History   Tobacco Use  Smoking Status Former Smoker  . Packs/day: 0.50  . Years: 15.00  . Pack years: 7.50  . Types: Cigarettes  . Quit date: 08/10/1992  . Years since quitting: 27.3  Smokeless Tobacco Never Used     Counseling given: Not Answered   Clinical Intake:  Pre-visit preparation completed: Yes        Diabetes: No  How often do you need to have someone help you when you read instructions, pamphlets, or other written materials from your doctor or pharmacy?: 1 - Never  Interpreter Needed?: No     Past Medical History:  Diagnosis Date  . Allergy   . Anemia   . Anxiety   . Arthritis    neck, no  limitations  . Cancer ([place])    SKIN  . Cough    CHRONIC FROM REFLUX  . Depression   . GERD (gastroesophageal reflux disease)    SLEEPS WITH HOB ELEVATED  . Hyperlipidemia    Past Surgical History:  Procedure Laterality Date  . BACK SURGERY  1980   Ruptured Disc  . CATARACT EXTRACTION W/PHACO Right 11/28/2015   Procedure: CATARACT EXTRACTION PHACO AND INTRAOCULAR LENS PLACEMENT (IOC);  Surgeon: Estill Cotta, MD;  Location: [facility];  Service: Ophthalmology;  Laterality: Right;  [place] AP% CDE fluid pack lot # [id number]  . CATARACT EXTRACTION W/PHACO Left 12/19/2015   Procedure: CATARACT EXTRACTION PHACO AND INTRAOCULAR LENS PLACEMENT (IOC);  Surgeon: Estill Cotta, MD;  Location: [facility];  Service: Ophthalmology;  Laterality: Left;  US:1:08.2 AP%:56.7% CDE:28.10 Lot# [id number]  . COLONOSCOPY    . COLONOSCOPY WITH PROPOFOL N/A 10/10/2017   Procedure: COLONOSCOPY WITH PROPOFOL;  Surgeon: Lucilla Lame, MD;  Location: [place];  Service: Endoscopy;  Laterality: N/A;  . POLYPECTOMY  10/10/2017   Procedure: POLYPECTOMY;  Surgeon: Lucilla Lame, MD;  Location: [facility];  Service: Endoscopy;;   Family History  Problem Relation Age of Onset  . Hyperlipidemia Sister   . Fibromyalgia Sister   . Breast cancer Maternal Aunt 75  . Heart disease Brother   . Diabetes Brother   . Hypertension Brother   .  Diabetes Paternal Grandmother    Social History   Socioeconomic History  . Marital status: Widowed    Spouse name: Not on file  . Number of children: Not on file  . Years of education: Not on file  . Highest education level: Not on file  Occupational History  . Not on file  Tobacco Use  . Smoking status: Former Smoker    Packs/day: 0.50    Years: 15.00    Pack years: 7.50    Types: Cigarettes    Quit date: 08/10/1992    Years since quitting: 27.3  . Smokeless tobacco: Never Used  Substance and Sexual Activity  . Alcohol use: No  . Drug use: No  .  Sexual activity: Not Currently  Other Topics Concern  . Not on file  Social History Narrative   Marital status: widowed.      Living with: alone.      Employment: none.      Tobacco: former user.      Alcohol: none      Drugs: none      Exercise: none; she tends to the farm.    Social Determinants of Health   Financial Resource Strain: Low Risk   . Difficulty of Paying Living Expenses: Not hard at all  Food Insecurity: No Food Insecurity  . Worried About Charity fundraiser in the Last Year: Never true  . Ran Out of Food in the Last Year: Never true  Transportation Needs: No Transportation Needs  . Lack of Transportation (Medical): No  . Lack of Transportation (Non-Medical): No  Physical Activity: Sufficiently Active  . Days of Exercise per Week: 5 days  . Minutes of Exercise per Session: 60 min  Stress: No Stress Concern Present  . Feeling of Stress : Not at all  Social Connections: Slightly Isolated  . Frequency of Communication with Friends and Family: More than three times a week  . Frequency of Social Gatherings with Friends and Family: More than three times a week  . Attends Religious Services: More than 4 times per year  . Active Member of Clubs or Organizations: Yes  . Attends Archivist Meetings: More than 4 times per year  . Marital Status: Widowed    Outpatient Encounter Medications as of 12/04/2019  Medication Sig  . Ascorbic Acid (VITAMIN C) 1000 MG tablet Take 1,000 mg by mouth daily.  . benzonatate (TESSALON) 200 MG capsule Take 1 capsule (200 mg total) by mouth 3 (three) times daily.  . Cholecalciferol (HM VITAMIN D3) 2000 UNITS CAPS Take 1 capsule by mouth daily.  . cyanocobalamin (,VITAMIN B-12,) 1000 MCG/ML injection Inject 1 ml intrumuscularly daily x 3, then weekly x 4, then monthly thereafter  . docusate sodium (COLACE) 100 MG capsule Take 1 capsule (100 mg total) by mouth 2 (two) times daily.  . Ferrous Sulfate (IRON) 325 (65 Fe) MG TABS TAKE  1 TABLET BY MOUTH EVERY DAY WITH BREAKFAST  . FLUoxetine (PROZAC) 20 MG capsule TAKE 1 CAPSULE(20 MG) BY MOUTH DAILY  . fluticasone (FLONASE) 50 MCG/ACT nasal spray SHAKE LIQUID AND USE 2 SPRAYS IN EACH NOSTRIL EVERY DAY  . levocetirizine (XYZAL) 5 MG tablet Take 1 tablet (5 mg total) by mouth every evening.  . meclizine (ANTIVERT) 12.5 MG tablet Take 1 tablet (12.5 mg total) by mouth 3 (three) times daily as needed for dizziness.  . Misc Natural Products (OSTEO BI-FLEX JOINT SHIELD PO) Take 1 tablet by mouth daily.  . pantoprazole (  PROTONIX) 40 MG tablet Take 1 tablet (40 mg total) by mouth 2 (two) times daily.  . rosuvastatin (CRESTOR) 5 MG tablet Take 1 tablet (5 mg total) by mouth daily.  Marland Kitchen Spacer/Aero-Holding Chambers (AEROCHAMBER MV) inhaler Use as instructed  . Syringe, Disposable, 1 ML MISC For use with B12 injections  . budesonide-formoterol (SYMBICORT) 80-4.5 MCG/ACT inhaler Inhale 2 puffs into the lungs 2 (two) times daily for 1 day.  . [DISCONTINUED] montelukast (SINGULAIR) 10 MG tablet Take 1 tablet (10 mg total) by mouth at bedtime.   No facility-administered encounter medications on file as of 12/04/2019.    Activities of Daily Living In your present state of health, do you have any difficulty performing the following activities: 12/04/2019  Hearing? N  Vision? N  Difficulty concentrating or making decisions? N  Walking or climbing stairs? N  Dressing or bathing? N  Doing errands, shopping? N  Preparing Food and eating ? N  Using the Toilet? N  In the past six months, have you accidently leaked urine? N  Do you have problems with loss of bowel control? N  Managing your Medications? N  Managing your Finances? N  Housekeeping or managing your Housekeeping? N  Some recent data might be hidden    Patient Care Team: Crecencio Mc, MD as PCP - General (Internal Medicine)    Assessment:   This is a routine wellness examination for Keishia.  Nurse connected with patient  12/04/19 at  9:30 AM EST by a telephone enabled telemedicine application and verified that I am speaking with the correct person using two identifiers. Patient stated full name and DOB. Patient gave permission to continue with virtual visit. Patient's location was at home and Nurse's location was at [place] office.   Patient is alert and oriented x3. Patient denies difficulty focusing or concentrating. Patient likes to read daily devotions for brain stimulation.   Health Maintenance Due: See completed HM at the end of note.   Eye: Visual acuity not assessed. Virtual visit. Followed by their ophthalmologist.  Dental: Visits every 6 months.    Hearing: Demonstrates normal hearing during visit.  Safety:  Patient feels safe at home- yes Patient does have smoke detectors at home- yes Patient does wear sunscreen or protective clothing when in direct sunlight - yes Patient does wear seat belt when in a moving vehicle - yes Patient drives- yes Adequate lighting in walkways free from debris- yes Grab bars and handrails used as appropriate- yes Ambulates with an assistive device- no  Cell phone on person when ambulating outside of the home- yes  Social: Alcohol intake - no     Smoking history- never   Smokers in home? none Illicit drug use? none  Medication: Symbicort added by pulmonologist and reports she is doing much better with minimal cough. Patient notes she is considering stopping XYZAL 5mg  and starting OTC cetirizine hydrochloride 10mg . Will discuss with pulmonologist and/or pcp prior to making any changes.  Prozac- taken every other day or daily as needed. Encouraged to take all medications as directed.  Pill box in use -yes  Self managed - yes   Covid-19: Precautions and sickness symptoms discussed. Wears mask, social distancing, hand hygiene as appropriate.   Activities of Daily Living Patient denies needing assistance with: household chores, feeding themselves, getting  from bed to chair, getting to the toilet, bathing/showering, dressing, managing money, or preparing meals.   Discussed the importance of a healthy diet, water intake and the benefits of aerobic  exercise.   Physical activity- walking, works on a farm daily   Diet:  Regular Water: good intake  Other Providers Patient Care Team: Crecencio Mc, MD as PCP - General (Internal Medicine)  Exercise Activities and Dietary recommendations Current Exercise Habits: Home exercise routine, Type of exercise: walking, Time (Minutes): 60, Frequency (Times/Week): 5, Weekly Exercise (Minutes/Week): 300, Intensity: Moderate  Goals    . DIET - INCREASE LEAN PROTEINS     Weight 127lb Low carb diet    . Increase physical activity     Walk for exercise        Fall Risk Fall Risk  12/04/2019 06/26/2018 06/25/2017 03/11/2017 03/02/2015  Falls in the past year? 0 No Yes No No  Number falls in past yr: - - 1 - -  Injury with Fall? - - No - -  Follow up Falls evaluation completed - Falls prevention discussed;Education provided - -   Timed Get Up and Go performed: no, virtual visit  Depression Screen PHQ 2/9 Scores 12/04/2019 06/26/2018 06/25/2017 03/11/2017  PHQ - 2 Score 1 0 0 0  PHQ- 9 Score - - 0 1     Cognitive Function MMSE - Mini Mental State Exam 06/26/2018  Orientation to time 5  Orientation to Place 5  Registration 3  Attention/ Calculation 5  Recall 3  Language- name 2 objects 2  Language- repeat 1  Language- follow 3 step command 3  Language- read & follow direction 1  Write a sentence 1  Copy design 1  Total score 30     6CIT Screen 12/04/2019  What Year? 0 points  What month? 0 points  What time? 0 points    Immunization History  Administered Date(s) Administered  . Fluad Quad(high Dose 65+) 08/25/2019  . Influenza Split 08/10/2012  . Influenza, High Dose Seasonal PF 09/14/2016, 09/03/2017, 08/19/2018  . Influenza,inj,Quad PF,6+ Mos 08/20/2013, 08/26/2014, 09/06/2015  .  Pneumococcal Conjugate-13 02/25/2014  . Pneumococcal Polysaccharide-23 02/18/2013  . Tdap 12/01/2010  . Zoster 10/20/2015  . Zoster Recombinat (Shingrix) 10/23/2019   Screening Tests Health Maintenance  Topic Date Due  . MAMMOGRAM  05/27/2020  . TETANUS/TDAP  12/01/2020  . COLONOSCOPY  10/10/2022  . INFLUENZA VACCINE  Completed  . DEXA SCAN  Completed  . Hepatitis C Screening  Completed  . PNA vac Low Risk Adult  Completed       Plan:   Keep all routine maintenance appointments.   Next scheduled nurse visit 12/08/19 @ 8:30- B12  Cpe 06/22/20 @ 8:30  Medication management-Symbicort added by pulmonologist and reports she is doing much better with little to no cough. Patient notes she is considering stopping XYZAL 5mg  and starting cetirizine hydrochloride 10mg . Will discuss with pulmonologist and/or pcp prior to making any changes.   Medicare Attestation I have personally reviewed: The patient's medical and social history Their use of alcohol, tobacco or illicit drugs Their current medications and supplements The patient's functional ability including ADLs,fall risks, home safety risks, cognitive, and hearing and visual impairment Diet and physical activities Evidence for depression   I have reviewed and discussed with patient certain preventive protocols, quality metrics, and best practice recommendations.      Varney Biles, LPN  [id number]

## 2019-12-04 NOTE — Patient Instructions (Addendum)
  Ms. Nancy Hamilton , Thank you for taking time to come for your Medicare Wellness Visit. I appreciate your ongoing commitment to your health goals. Please review the following plan we discussed and let me know if I can assist you in the future.   These are the goals we discussed: Goals    . DIET - INCREASE LEAN PROTEINS     Weight 127lb Low carb diet    . Increase physical activity     Walk for exercise        This is a list of the screening recommended for you and due dates:  Health Maintenance  Topic Date Due  . Mammogram  05/27/2020  . Tetanus Vaccine  12/01/2020  . Colon Cancer Screening  10/10/2022  . Flu Shot  Completed  . DEXA scan (bone density measurement)  Completed  .  Hepatitis C: One time screening is recommended by Center for Disease Control  (CDC) for  adults born from [birth year] through 1964.   Completed  . Pneumonia vaccines  Completed

## 2019-12-08 ENCOUNTER — Other Ambulatory Visit: Payer: Self-pay

## 2019-12-08 ENCOUNTER — Ambulatory Visit (INDEPENDENT_AMBULATORY_CARE_PROVIDER_SITE_OTHER): Payer: PPO

## 2019-12-08 VITALS — Temp 97.3°F

## 2019-12-08 DIAGNOSIS — E538 Deficiency of other specified B group vitamins: Secondary | ICD-10-CM | POA: Diagnosis not present

## 2019-12-08 MED ORDER — CYANOCOBALAMIN 1000 MCG/ML IJ SOLN
1000.0000 ug | Freq: Once | INTRAMUSCULAR | Status: AC
  Administered 2019-12-08: 09:00:00 1000 ug via INTRAMUSCULAR

## 2019-12-08 NOTE — Progress Notes (Signed)
Nancy Hamilton presents today for injection per MD orders. °B12 injection administered IM in right Upper Arm. °Administration without incident. °Patient tolerated well. °Owen Pratte,cma  °

## 2019-12-09 ENCOUNTER — Ambulatory Visit: Payer: PPO | Admitting: Pulmonary Disease

## 2019-12-09 ENCOUNTER — Encounter: Payer: Self-pay | Admitting: Pulmonary Disease

## 2019-12-09 VITALS — BP 116/68 | HR 65 | Temp 97.2°F | Ht 62.0 in | Wt 139.2 lb

## 2019-12-09 DIAGNOSIS — R059 Cough, unspecified: Secondary | ICD-10-CM

## 2019-12-09 DIAGNOSIS — R05 Cough: Secondary | ICD-10-CM

## 2019-12-09 DIAGNOSIS — J45991 Cough variant asthma: Secondary | ICD-10-CM

## 2019-12-09 DIAGNOSIS — K219 Gastro-esophageal reflux disease without esophagitis: Secondary | ICD-10-CM

## 2019-12-09 MED ORDER — BUDESONIDE-FORMOTEROL FUMARATE 160-4.5 MCG/ACT IN AERO: 2.0000 | INHALATION_SPRAY | Freq: Two times a day (BID) | RESPIRATORY_TRACT | 3 refills | Status: DC

## 2019-12-09 NOTE — Progress Notes (Signed)
 Assessment & Plan:  1. Cough variant asthma (Primary)  2. Laryngopharyngeal reflux (LPR)  3. Cough   Patient Instructions  We have increased your dose of the inhaler it is Symbicort  160/4.5, 2 inhalations twice a day continue using it with the spacer.  Make sure you rinse your mouth well after use it.  We will see you back in follow-up in 3 to 4 months time.  Call sooner should any new difficulties arise.  Please note: late entry documentation due to logistical difficulties during COVID-19 pandemic. This note is filed for information purposes only, and is not intended to be used for billing, nor does it represent the full scope/nature of the visit in question. Please see any associated scanned media linked to date of encounter for additional pertinent information.  Subjective:    HPI: Nancy Hamilton is a 74 y.o. female presenting to the pulmonology clinic on 12/09/2019 with report of: Follow-up (Pt states she has been feeling much better since last OV. Pt still has some coughing but not as much. Pt denies any SOB, wheezing, fever, or chills.)     Outpatient Encounter Medications as of 12/09/2019  Medication Sig   Cholecalciferol 50 MCG (2000 UT) CAPS Take 1 capsule by mouth daily.   Spacer/Aero-Holding Chambers (AEROCHAMBER MV) inhaler Use as instructed   [DISCONTINUED] Ascorbic Acid (VITAMIN C) 1000 MG tablet Take 1,000 mg by mouth daily. (Patient not taking: Reported on 02/21/2023)   [DISCONTINUED] benzonatate  (TESSALON ) 200 MG capsule Take 1 capsule (200 mg total) by mouth 3 (three) times daily. (Patient not taking: Reported on 07/12/2021)   [DISCONTINUED] cyanocobalamin  (,VITAMIN B-12,) 1000 MCG/ML injection Inject 1 ml intrumuscularly daily x 3, then weekly x 4, then monthly thereafter (Patient not taking: Reported on 09/21/2022)   [DISCONTINUED] docusate sodium  (COLACE) 100 MG capsule Take 1 capsule (100 mg total) by mouth 2 (two) times daily.   [DISCONTINUED] Ferrous  Sulfate (IRON ) 325 (65 Fe) MG TABS TAKE 1 TABLET BY MOUTH EVERY DAY WITH BREAKFAST   [DISCONTINUED] FLUoxetine  (PROZAC ) 20 MG capsule TAKE 1 CAPSULE(20 MG) BY MOUTH DAILY   [DISCONTINUED] fluticasone  (FLONASE ) 50 MCG/ACT nasal spray SHAKE LIQUID AND USE 2 SPRAYS IN EACH NOSTRIL EVERY DAY   [DISCONTINUED] levocetirizine (XYZAL ) 5 MG tablet Take 1 tablet (5 mg total) by mouth every evening.   [DISCONTINUED] meclizine  (ANTIVERT ) 12.5 MG tablet Take 1 tablet (12.5 mg total) by mouth 3 (three) times daily as needed for dizziness. (Patient not taking: Reported on 09/21/2022)   [DISCONTINUED] Misc Natural Products (OSTEO BI-FLEX JOINT SHIELD PO) Take 1 tablet by mouth daily. (Patient not taking: Reported on 01/22/2022)   [DISCONTINUED] pantoprazole  (PROTONIX ) 40 MG tablet Take 1 tablet (40 mg total) by mouth 2 (two) times daily.   [DISCONTINUED] rosuvastatin  (CRESTOR ) 5 MG tablet Take 1 tablet (5 mg total) by mouth daily.   [DISCONTINUED] SHINGRIX injection  (Patient not taking: Reported on 06/22/2020)   [DISCONTINUED] Syringe, Disposable, 1 ML MISC For use with B12 injections (Patient not taking: Reported on 09/21/2022)   [DISCONTINUED] budesonide -formoterol  (SYMBICORT ) 160-4.5 MCG/ACT inhaler Inhale 2 puffs into the lungs 2 (two) times daily.   [DISCONTINUED] budesonide -formoterol  (SYMBICORT ) 80-4.5 MCG/ACT inhaler Inhale 2 puffs into the lungs 2 (two) times daily for 1 day.   No facility-administered encounter medications on file as of 12/09/2019.      Objective:   Vitals:   12/09/19 0834  BP: 116/68  Pulse: 65  Temp: (!) 97.2 F (36.2 C)  Height: 5' 2 (  1.575 m)  Weight: 139 lb 3.2 oz (63.1 kg)  SpO2: 96% Comment: on ra  TempSrc: Temporal  BMI (Calculated): 25.45     Physical exam documentation is limited by delayed entry of information.

## 2019-12-09 NOTE — Patient Instructions (Signed)
We have increased your dose of the inhaler it is Symbicort 160/4.5, 2 inhalations twice a day continue using it with the spacer.  Make sure you rinse your mouth well after use it.  We will see you back in follow-up in 3 to 4 months time.  Call sooner should any new difficulties arise.

## 2020-01-01 ENCOUNTER — Other Ambulatory Visit: Payer: Self-pay

## 2020-01-01 MED ORDER — FLUTICASONE PROPIONATE 50 MCG/ACT NA SUSP: NASAL | 1 refills | Status: DC

## 2020-01-12 ENCOUNTER — Telehealth: Payer: Self-pay

## 2020-01-12 ENCOUNTER — Other Ambulatory Visit: Payer: Self-pay

## 2020-01-12 ENCOUNTER — Ambulatory Visit (INDEPENDENT_AMBULATORY_CARE_PROVIDER_SITE_OTHER): Payer: PPO

## 2020-01-12 DIAGNOSIS — E538 Deficiency of other specified B group vitamins: Secondary | ICD-10-CM

## 2020-01-12 MED ORDER — CYANOCOBALAMIN 1000 MCG/ML IJ SOLN
1000.0000 ug | Freq: Once | INTRAMUSCULAR | Status: AC
  Administered 2020-01-12: 1000 ug via INTRAMUSCULAR

## 2020-01-12 MED ORDER — LEVOCETIRIZINE DIHYDROCHLORIDE 5 MG PO TABS: 5.0000 mg | ORAL_TABLET | Freq: Every evening | ORAL | 1 refills | Status: DC

## 2020-01-12 NOTE — Telephone Encounter (Signed)
No problem,  Refilled (allergy med)

## 2020-01-12 NOTE — Progress Notes (Signed)
Pt presents today for b12 injection. Left deltoid, IM. Pt tolerated well.

## 2020-01-12 NOTE — Telephone Encounter (Signed)
Pt was in this morning for a nurse visit and she stated that she needed a refill on her levocertrizine. It is not in her current medication list.

## 2020-02-11 ENCOUNTER — Telehealth: Payer: Self-pay | Admitting: Internal Medicine

## 2020-02-11 MED ORDER — PANTOPRAZOLE SODIUM 40 MG PO TBEC: 40.0000 mg | DELAYED_RELEASE_TABLET | Freq: Two times a day (BID) | ORAL | 1 refills | Status: DC

## 2020-02-11 NOTE — Telephone Encounter (Signed)
Pt needs a 90 day refill on pantoprazole (PROTONIX) 40 MG tablet today. Pt is out

## 2020-02-16 ENCOUNTER — Ambulatory Visit (INDEPENDENT_AMBULATORY_CARE_PROVIDER_SITE_OTHER): Payer: PPO

## 2020-02-16 ENCOUNTER — Other Ambulatory Visit: Payer: Self-pay

## 2020-02-16 DIAGNOSIS — E538 Deficiency of other specified B group vitamins: Secondary | ICD-10-CM | POA: Diagnosis not present

## 2020-02-16 MED ORDER — CYANOCOBALAMIN 1000 MCG/ML IJ SOLN
1000.0000 ug | Freq: Once | INTRAMUSCULAR | Status: AC
  Administered 2020-02-16: 1000 ug via INTRAMUSCULAR

## 2020-02-16 NOTE — Progress Notes (Signed)
Patient presented for B 12 injection to right deltoid, patient voiced no concerns nor showed any signs of distress during injection. 

## 2020-03-03 DIAGNOSIS — Z85828 Personal history of other malignant neoplasm of skin: Secondary | ICD-10-CM | POA: Diagnosis not present

## 2020-03-03 DIAGNOSIS — D485 Neoplasm of uncertain behavior of skin: Secondary | ICD-10-CM | POA: Diagnosis not present

## 2020-03-03 DIAGNOSIS — L821 Other seborrheic keratosis: Secondary | ICD-10-CM | POA: Diagnosis not present

## 2020-03-03 DIAGNOSIS — L570 Actinic keratosis: Secondary | ICD-10-CM | POA: Diagnosis not present

## 2020-03-03 DIAGNOSIS — D2272 Melanocytic nevi of left lower limb, including hip: Secondary | ICD-10-CM | POA: Diagnosis not present

## 2020-03-03 DIAGNOSIS — D2262 Melanocytic nevi of left upper limb, including shoulder: Secondary | ICD-10-CM | POA: Diagnosis not present

## 2020-03-03 DIAGNOSIS — D2261 Melanocytic nevi of right upper limb, including shoulder: Secondary | ICD-10-CM | POA: Diagnosis not present

## 2020-03-03 DIAGNOSIS — D0471 Carcinoma in situ of skin of right lower limb, including hip: Secondary | ICD-10-CM | POA: Diagnosis not present

## 2020-03-03 DIAGNOSIS — X32XXXA Exposure to sunlight, initial encounter: Secondary | ICD-10-CM | POA: Diagnosis not present

## 2020-03-03 DIAGNOSIS — D2271 Melanocytic nevi of right lower limb, including hip: Secondary | ICD-10-CM | POA: Diagnosis not present

## 2020-03-16 ENCOUNTER — Ambulatory Visit (INDEPENDENT_AMBULATORY_CARE_PROVIDER_SITE_OTHER): Payer: PPO

## 2020-03-16 ENCOUNTER — Other Ambulatory Visit: Payer: Self-pay

## 2020-03-16 DIAGNOSIS — E538 Deficiency of other specified B group vitamins: Secondary | ICD-10-CM

## 2020-03-16 MED ORDER — CYANOCOBALAMIN 1000 MCG/ML IJ SOLN
1000.0000 ug | Freq: Once | INTRAMUSCULAR | Status: AC
  Administered 2020-03-16: 1000 ug via INTRAMUSCULAR

## 2020-03-16 NOTE — Progress Notes (Signed)
Patient presented for B 12 injection to right deltoid, patient voiced no concerns nor showed any signs of distress during injection. 

## 2020-04-19 ENCOUNTER — Ambulatory Visit: Payer: PPO

## 2020-04-19 ENCOUNTER — Other Ambulatory Visit: Payer: Self-pay

## 2020-04-19 ENCOUNTER — Ambulatory Visit (INDEPENDENT_AMBULATORY_CARE_PROVIDER_SITE_OTHER): Payer: PPO

## 2020-04-19 DIAGNOSIS — E538 Deficiency of other specified B group vitamins: Secondary | ICD-10-CM

## 2020-04-19 MED ORDER — CYANOCOBALAMIN 1000 MCG/ML IJ SOLN
1000.0000 ug | Freq: Once | INTRAMUSCULAR | Status: AC
  Administered 2020-04-19: 1000 ug via INTRAMUSCULAR

## 2020-04-19 NOTE — Progress Notes (Addendum)
Patient presented for B 12 injection to left deltoid, patient voiced no concerns nor showed any signs of distress during injection.  Reviewed.  Dr Scott 

## 2020-04-26 ENCOUNTER — Other Ambulatory Visit: Payer: Self-pay | Admitting: Internal Medicine

## 2020-04-26 DIAGNOSIS — Z1231 Encounter for screening mammogram for malignant neoplasm of breast: Secondary | ICD-10-CM

## 2020-04-26 DIAGNOSIS — L570 Actinic keratosis: Secondary | ICD-10-CM | POA: Diagnosis not present

## 2020-04-26 DIAGNOSIS — D0471 Carcinoma in situ of skin of right lower limb, including hip: Secondary | ICD-10-CM | POA: Diagnosis not present

## 2020-05-10 ENCOUNTER — Other Ambulatory Visit: Payer: Self-pay

## 2020-05-10 ENCOUNTER — Encounter: Payer: Self-pay | Admitting: Pulmonary Disease

## 2020-05-10 ENCOUNTER — Ambulatory Visit: Payer: PPO | Admitting: Pulmonary Disease

## 2020-05-10 VITALS — BP 110/62 | HR 74 | Temp 98.2°F | Ht 62.0 in | Wt 135.6 lb

## 2020-05-10 DIAGNOSIS — K219 Gastro-esophageal reflux disease without esophagitis: Secondary | ICD-10-CM

## 2020-05-10 DIAGNOSIS — J45991 Cough variant asthma: Secondary | ICD-10-CM | POA: Diagnosis not present

## 2020-05-10 MED ORDER — ALBUTEROL SULFATE HFA 108 (90 BASE) MCG/ACT IN AERS: 2.0000 | INHALATION_SPRAY | Freq: Four times a day (QID) | RESPIRATORY_TRACT | 2 refills | Status: AC | PRN

## 2020-05-10 NOTE — Patient Instructions (Signed)
What we discussed today:   Your asthma is cough variant asthma, if you note that your cough restarts make sure that you use your Symbicort twice a day  You are getting a prescription for an emergency inhaler this is to have with you in case you have issues with shortness of breath or cough it is a RESCUE inhaler only used as needed.  We'll see you in follow-up in 6 months time call sooner should any new difficulties arise.

## 2020-05-10 NOTE — Progress Notes (Signed)
Subjective:    Patient ID: Nancy Hamilton, female    DOB: 1946/08/31, 74 y.o.   MRN: [id number]  HPI The patient is a 74 year old remote former smoker (quit 1993) who presents for follow-up on cough variant asthma with a component of LPR trigger.  During her last visit in January of 2021 we had to increase her Symbicort inhaler to 160/4.5, 2 inhalations twice a day.  She has actually done very well with this and her cough is now resolved she only notices when she enters her car after work otherwise notice it.  Her GERD/LPR is well controlled.  She has limited her work hours to 3 days a week and notices that this has also helped her.  He has not had any fevers, chills or sweats no sputum production or hemoptysis.  She voices no other complaint today.  She feels well and looks well.  Review of Systems A 10 point review of systems was performed and it is as noted above otherwise negative.  Allergies  Allergen Reactions   Tylox [Oxycodone-Acetaminophen] Rash   Current Meds  Medication Sig   Ascorbic Acid (VITAMIN C) 1000 MG tablet Take 1,000 mg by mouth daily.   benzonatate (TESSALON) 200 MG capsule Take 1 capsule (200 mg total) by mouth 3 (three) times daily.   budesonide-formoterol (SYMBICORT) 160-4.5 MCG/ACT inhaler Inhale 2 puffs into the lungs 2 (two) times daily.   Cholecalciferol (HM VITAMIN D3) 2000 UNITS CAPS Take 1 capsule by mouth daily.   cyanocobalamin (,VITAMIN B-12,) 1000 MCG/ML injection Inject 1 ml intrumuscularly daily x 3, then weekly x 4, then monthly thereafter   docusate sodium (COLACE) 100 MG capsule Take 1 capsule (100 mg total) by mouth 2 (two) times daily.   Ferrous Sulfate (IRON) 325 (65 Fe) MG TABS TAKE 1 TABLET BY MOUTH EVERY DAY WITH BREAKFAST   FLUoxetine (PROZAC) 20 MG capsule TAKE 1 CAPSULE(20 MG) BY MOUTH DAILY   fluticasone (FLONASE) 50 MCG/ACT nasal spray SHAKE LIQUID AND USE 2 SPRAYS IN EACH NOSTRIL EVERY DAY   levocetirizine (XYZAL) 5 MG  tablet Take 1 tablet (5 mg total) by mouth every evening.   meclizine (ANTIVERT) 12.5 MG tablet Take 1 tablet (12.5 mg total) by mouth 3 (three) times daily as needed for dizziness.   Misc Natural Products (OSTEO BI-FLEX JOINT SHIELD PO) Take 1 tablet by mouth daily.   pantoprazole (PROTONIX) 40 MG tablet Take 1 tablet (40 mg total) by mouth 2 (two) times daily.   rosuvastatin (CRESTOR) 5 MG tablet Take 1 tablet (5 mg total) by mouth daily.   SHINGRIX injection    Spacer/Aero-Holding Chambers (AEROCHAMBER MV) inhaler Use as instructed   Syringe, Disposable, 1 ML MISC For use with B12 injections   Immunization History  Administered Date(s) Administered   Fluad Quad(high Dose 65+) 08/25/2019   Influenza Split 08/10/2012   Influenza, High Dose Seasonal PF 09/14/2016, 09/03/2017, 08/19/2018   Influenza,inj,Quad PF,6+ Mos 08/20/2013, 08/26/2014, 09/06/2015   Moderna SARS-COVID-2 Vaccination 12/22/2019, 01/19/2020   Pneumococcal Conjugate-13 02/25/2014   Pneumococcal Polysaccharide-23 02/18/2013   Tdap 12/01/2010   Zoster 10/20/2015   Zoster Recombinat (Shingrix) 10/23/2019, 02/10/2020       Objective:   Physical Exam BP 110/62 (BP Location: Right Arm, Cuff Size: Normal)    Pulse 74    Temp 98.2 F (36.8 C) (Temporal)    Ht 5\' 2"  (1.575 m)    Wt 135 lb 9.6 oz (61.5 kg)    SpO2 97%  BMI 24.80 kg/m   GENERAL: Well-developed well-nourished, fit appearing, fully ambulatory, no acute distress. HEAD: Normocephalic, atraumatic.  EYES: Pupils equal, round, reactive to light.  No scleral icterus.  MOUTH: Nose/mouth/throat not examined due to masking requirements for COVID 19. NECK: Supple. No thyromegaly. Trachea midline. No JVD.  No adenopathy. PULMONARY: Lungs clear to auscultation bilaterally.  Good air entry bilaterally. CARDIOVASCULAR: S1 and S2. Regular rate and rhythm.  No rubs, murmurs or gallops heard. GASTROINTESTINAL: No abdominal distention  noted. MUSCULOSKELETAL: No joint deformity, no clubbing, no edema.  NEUROLOGIC: Awake, alert, fully oriented.  No focal deficit noted.  No gait disturbance noted. SKIN: Intact,warm,dry.  On limited exam no rashes. PSYCH: Mood and behavior appropriate.       Assessment & Plan:     ICD-10-CM   1. Cough variant asthma  J45.991    Continue Symbicort 160/4.5, 2 inhalations twice a day Albuterol as rescue  2. Laryngopharyngeal reflux (LPR)  K21.9    Continue antireflux measures and Protonix   Meds ordered this encounter  Medications   albuterol (VENTOLIN HFA) 108 (90 Base) MCG/ACT inhaler    Sig: Inhale 2 puffs into the lungs every 6 (six) hours as needed for wheezing or shortness of breath (cough).    Dispense:  18 g    Refill:  2   Discussion:  Patient's cough variant asthma is well controlled.  Continue Symbicort as above.  She has been prescribed a rescue inhaler.  We will see her in follow-up in 6 months time she is to contact us prior to that time should any new difficulties arise.   Renold Don, MD [facility]   *This note was dictated using voice recognition software/Dragon.  Despite best efforts to proofread, errors can occur which can change the meaning.  Any change was purely unintentional.

## 2020-05-15 IMAGING — DX DG CHEST 2V
2 series · 2 of 2 positions shown · non-contrast
Comparison: 01/26/2016 chest radiograph.

CLINICAL DATA: 72 y/o  F; 2 years of persistent cough.

EXAM:
CHEST - 2 VIEW

[chest pa]
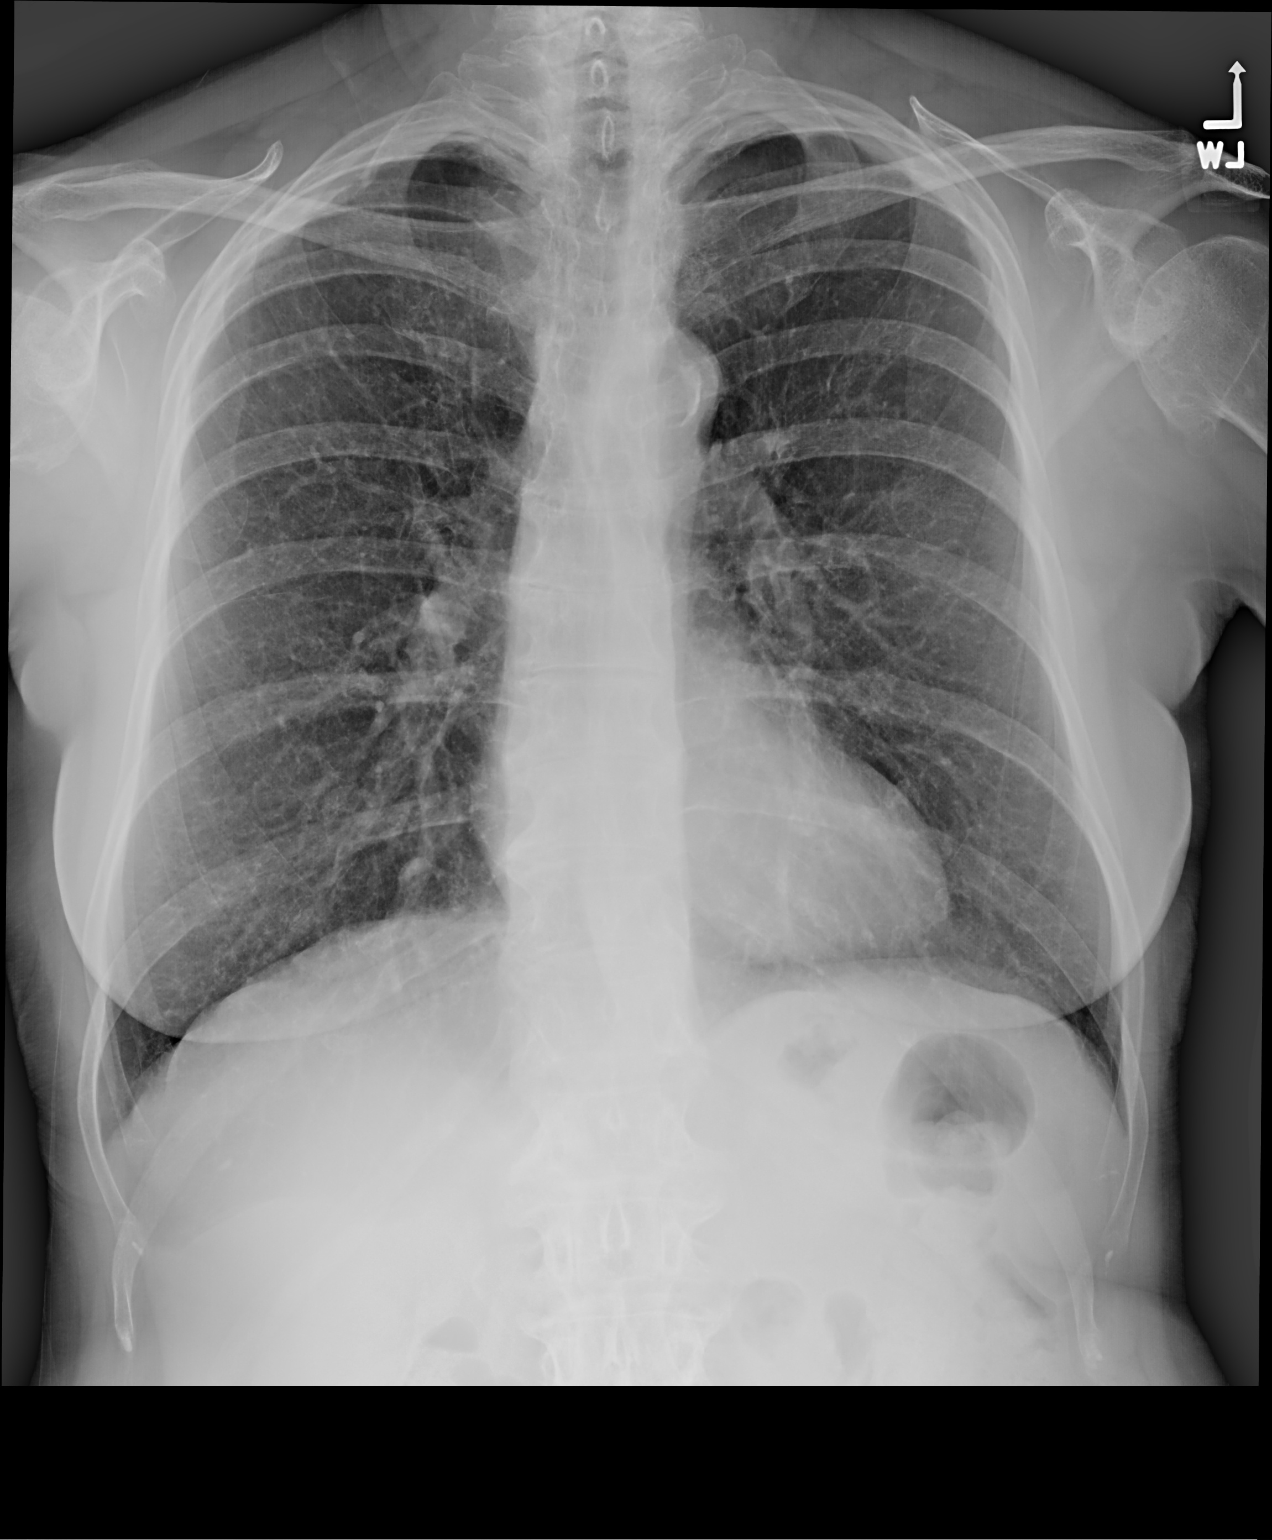

[chest lat]
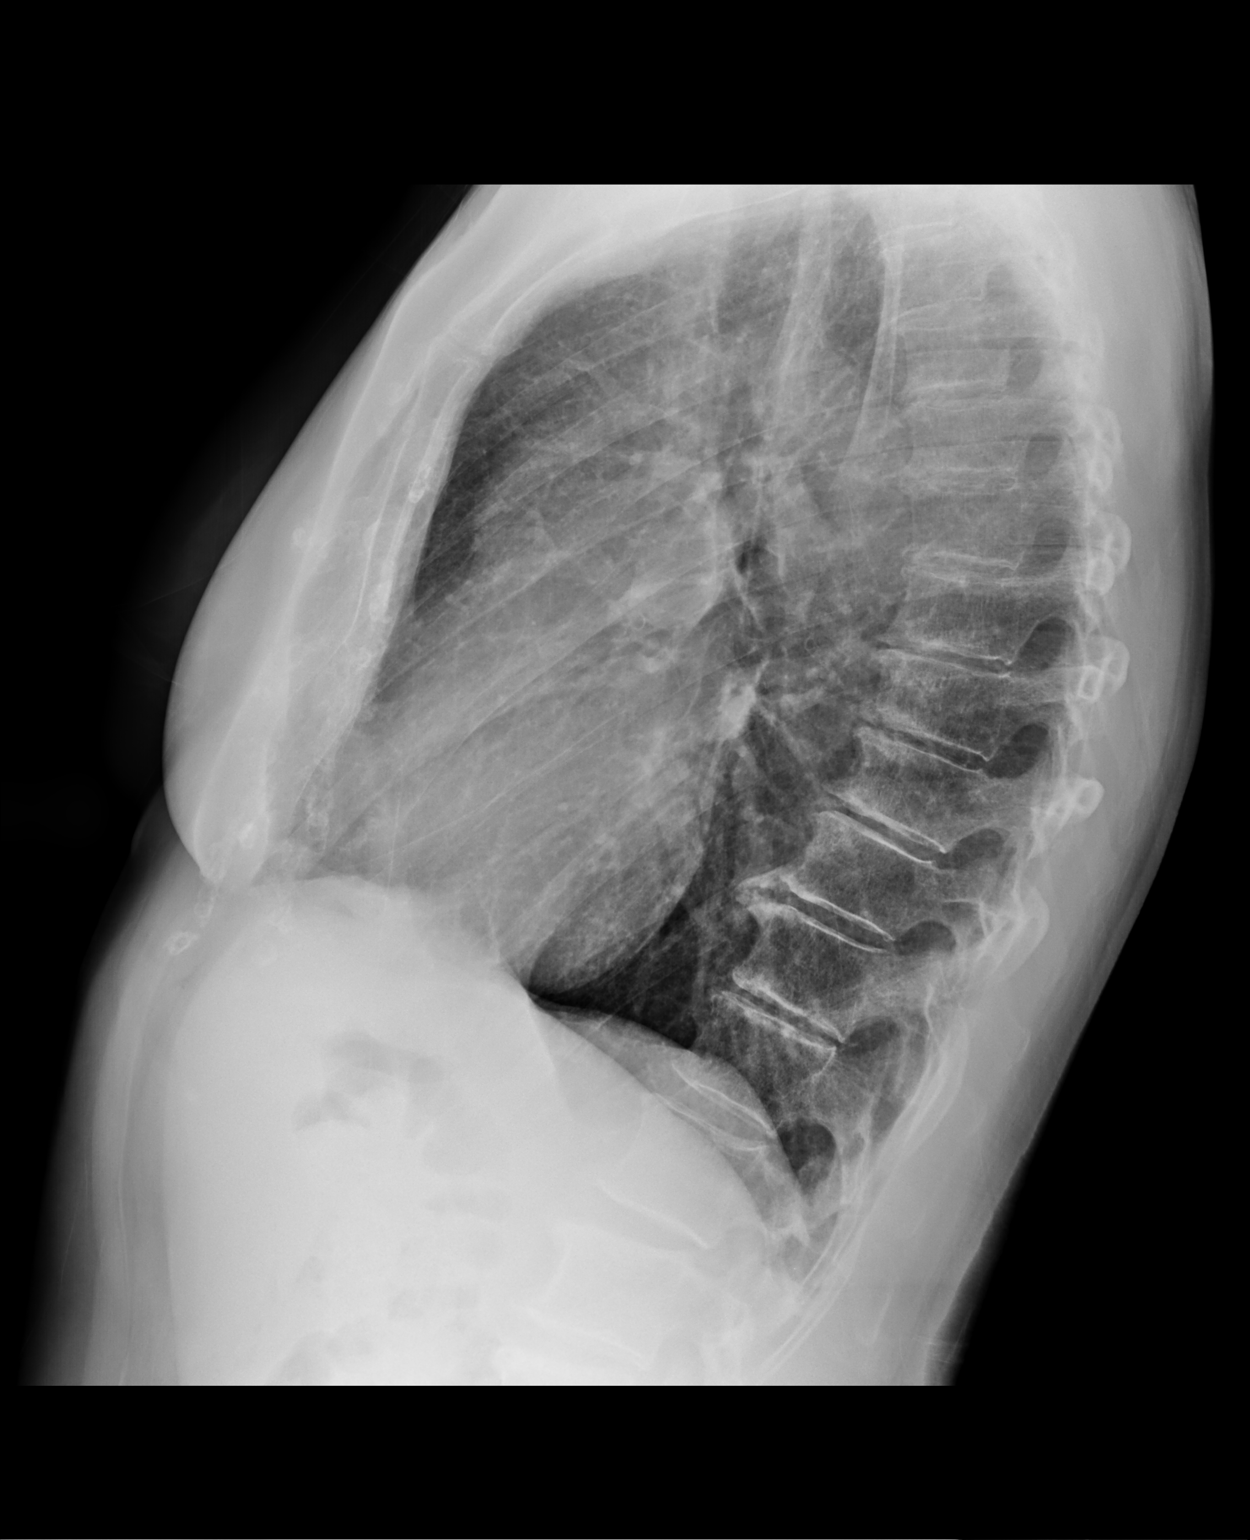

[2 of 2 positions shown; findings below may reference images not displayed]

FINDINGS: Stable normal cardiac silhouette given projection and technique.
Aortic atherosclerosis with calcification. No focal consolidation.
No pleural effusion or pneumothorax. No acute osseous abnormality is
evident. Stable spondylosis of the thoracic spine.
IMPRESSION: No acute pulmonary process identified. Aortic Atherosclerosis
([id number]).

## 2020-05-17 ENCOUNTER — Other Ambulatory Visit: Payer: Self-pay

## 2020-05-17 MED ORDER — ROSUVASTATIN CALCIUM 5 MG PO TABS: 5.0000 mg | ORAL_TABLET | Freq: Every day | ORAL | 0 refills | Status: DC

## 2020-05-24 ENCOUNTER — Other Ambulatory Visit: Payer: Self-pay

## 2020-05-24 ENCOUNTER — Ambulatory Visit: Payer: PPO

## 2020-05-24 ENCOUNTER — Ambulatory Visit (INDEPENDENT_AMBULATORY_CARE_PROVIDER_SITE_OTHER): Payer: PPO

## 2020-05-24 DIAGNOSIS — E538 Deficiency of other specified B group vitamins: Secondary | ICD-10-CM | POA: Diagnosis not present

## 2020-05-24 MED ORDER — CYANOCOBALAMIN 1000 MCG/ML IJ SOLN
1000.0000 ug | Freq: Once | INTRAMUSCULAR | Status: AC
  Administered 2020-05-24: 1000 ug via INTRAMUSCULAR

## 2020-05-24 NOTE — Progress Notes (Signed)
Patient presented for B 12 injection to left deltoid, patient voiced no concerns nor showed any signs of distress during injection. 

## 2020-05-25 ENCOUNTER — Ambulatory Visit: Payer: PPO

## 2020-05-26 ENCOUNTER — Other Ambulatory Visit: Payer: Self-pay | Admitting: Internal Medicine

## 2020-05-31 ENCOUNTER — Ambulatory Visit
Admission: RE | Admit: 2020-05-31 | Discharge: 2020-05-31 | Disposition: A | Discharge status: Home / Self Care | Payer: PPO | Source: Ambulatory Visit | Attending: Internal Medicine | Admitting: Internal Medicine

## 2020-05-31 DIAGNOSIS — Z1231 Encounter for screening mammogram for malignant neoplasm of breast: Secondary | ICD-10-CM

## 2020-06-22 ENCOUNTER — Other Ambulatory Visit: Payer: Self-pay

## 2020-06-22 ENCOUNTER — Encounter: Payer: Self-pay | Admitting: Internal Medicine

## 2020-06-22 ENCOUNTER — Ambulatory Visit (INDEPENDENT_AMBULATORY_CARE_PROVIDER_SITE_OTHER): Payer: PPO | Admitting: Internal Medicine

## 2020-06-22 VITALS — BP 124/72 | HR 69 | Temp 98.3°F | Resp 15 | Ht 62.0 in | Wt 134.8 lb

## 2020-06-22 DIAGNOSIS — E785 Hyperlipidemia, unspecified: Secondary | ICD-10-CM

## 2020-06-22 DIAGNOSIS — E611 Iron deficiency: Secondary | ICD-10-CM | POA: Diagnosis not present

## 2020-06-22 DIAGNOSIS — Z23 Encounter for immunization: Secondary | ICD-10-CM

## 2020-06-22 DIAGNOSIS — I700 Atherosclerosis of aorta: Secondary | ICD-10-CM

## 2020-06-22 DIAGNOSIS — Z0000 Encounter for general adult medical examination without abnormal findings: Secondary | ICD-10-CM | POA: Diagnosis not present

## 2020-06-22 LAB — LIPID PANEL
Cholesterol: 177 mg/dL (ref 0–200)
HDL: 73.4 mg/dL (ref >39.00)
LDL Cholesterol: 91 mg/dL (ref 0–99)
NonHDL: 103.61
Total CHOL/HDL Ratio: 2
Triglycerides: 64 mg/dL (ref 0.0–149.0)
VLDL: 12.8 mg/dL (ref 0.0–40.0)

## 2020-06-22 LAB — CBC WITH DIFFERENTIAL/PLATELET
Basophils Absolute: 0 10*3/uL (ref 0.0–0.1)
Basophils Relative: 0.5 % (ref 0.0–3.0)
Eosinophils Absolute: 0.2 10*3/uL (ref 0.0–0.7)
Eosinophils Relative: 3.5 % (ref 0.0–5.0)
HCT: 37.8 % (ref 36.0–46.0)
Hemoglobin: 12.7 g/dL (ref 12.0–15.0)
Lymphocytes Relative: 14.3 % (ref 12.0–46.0)
Lymphs Abs: 0.9 10*3/uL (ref 0.7–4.0)
MCHC: 33.6 g/dL (ref 30.0–36.0)
MCV: 87.5 fl (ref 78.0–100.0)
Monocytes Absolute: 0.4 10*3/uL (ref 0.1–1.0)
Monocytes Relative: 5.7 % (ref 3.0–12.0)
Neutro Abs: 4.7 10*3/uL (ref 1.4–7.7)
Neutrophils Relative %: 76 % (ref 43.0–77.0)
Platelets: 179 10*3/uL (ref 150.0–400.0)
RBC: 4.33 Mil/uL (ref 3.87–5.11)
RDW: 15 % (ref 11.5–15.5)
WBC: 6.2 10*3/uL (ref 4.0–10.5)

## 2020-06-22 LAB — COMPREHENSIVE METABOLIC PANEL
ALT: 8 U/L (ref 0–35)
AST: 11 U/L (ref 0–37)
Albumin: 4.2 g/dL (ref 3.5–5.2)
Alkaline Phosphatase: 69 U/L (ref 39–117)
BUN: 22 mg/dL (ref 6–23)
CO2: 27 mEq/L (ref 19–32)
Calcium: 9.2 mg/dL (ref 8.4–10.5)
Chloride: 105 mEq/L (ref 96–112)
Creatinine, Ser: 0.87 mg/dL (ref 0.40–1.20)
GFR: 63.8 mL/min (ref >60.00)
Glucose, Bld: 91 mg/dL (ref 70–99)
Potassium: 4.3 mEq/L (ref 3.5–5.1)
Sodium: 142 mEq/L (ref 135–145)
Total Bilirubin: 0.3 mg/dL (ref 0.2–1.2)
Total Protein: 6.5 g/dL (ref 6.0–8.3)

## 2020-06-22 LAB — TSH: TSH: 2.27 u[IU]/mL (ref 0.35–4.50)

## 2020-06-22 MED ORDER — IRON 325 (65 FE) MG PO TABS: ORAL_TABLET | ORAL | 0 refills | Status: DC

## 2020-06-22 NOTE — Progress Notes (Signed)
Patient ID: Nancy Hamilton, female    DOB: 1946-10-08  Age: 74 y.o. MRN: [id number]  The patient is here for annual  wellness examination and management of other chronic and acute problems.  This visit occurred during the SARS-CoV-2 public health emergency.  Safety protocols were in place, including screening questions prior to the visit, additional usage of staff PPE, and extensive cleaning of exam room while observing appropriate contact time as indicated for disinfecting solutions.    Patient has received both doses of the available COVID 19 vaccine without complications.  Patient continues to mask when outside of the home except when walking in yard or at safe distances from others .  Patient denies any change in mood or development of unhealthy behaviors resuting from the pandemic's restriction of activities and socialization.    The risk factors are reflected in the social history.  The roster of all physicians providing medical care to patient - is listed in the Snapshot section of the chart.  Activities of daily living:  The patient is 100% independent in all ADLs: dressing, toileting, feeding as well as independent mobility  Home safety : The patient has smoke detectors in the home. They wear seatbelts.  There are no firearms at home. There is no violence in the home.   There is no risks for hepatitis, STDs or HIV. There is no   history of blood transfusion. They have no travel history to infectious disease endemic areas of the world.  The patient has seen their dentist in the last six month. They have seen their eye doctor in the last year. They admit to slight hearing difficulty with regard to whispered voices and some television programs.  They have deferred audiologic testing in the last year.  They do not  have excessive sun exposure. Discussed the need for sun protection: hats, long sleeves and use of sunscreen if there is significant sun exposure.   Diet: the importance of a  healthy diet is discussed. They do have a healthy diet.  The benefits of regular aerobic exercise were discussed. She walks 4 times per week ,  20 minutes.   Depression screen: there are no signs or vegative symptoms of depression- irritability, change in appetite, anhedonia, sadness/tearfullness.  Cognitive assessment: the patient manages all their financial and personal affairs and is actively engaged. They could relate day,date,year and events; recalled 2/3 objects at 3 minutes; performed clock-face test normally.  The following portions of the patient's history were reviewed and updated as appropriate: allergies, current medications, past family history, past medical history,  past surgical history, past social history  and problem list.  Visual acuity was not assessed per patient preference since she has regular follow up with her ophthalmologist. Hearing and body mass index were assessed and reviewed.   During the course of the visit the patient was educated and counseled about appropriate screening and preventive services including : fall prevention , diabetes screening, nutrition counseling, colorectal cancer screening, and recommended immunizations.    CC: The primary encounter diagnosis was Iron deficiency. Diagnoses of Thoracic aortic atherosclerosis ([place]), Hyperlipidemia LDL goal <100, Need for 23-polyvalent pneumococcal polysaccharide vaccine, and Visit for preventive health examination were also pertinent to this visit.  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  Downsizing to a condo.  Walking on a treadmill,  Doing yardwork.  1) aortic atherosclerosis : noted on chest  X ray 2020  taking Crestor .  Reviewed with patient today   2) cough improved with  treatment by Dr. Patsey Berthold . Diagnosed with Cough variant ashtma.  Only using symbicort once daily in the morning for the past 1-2 months   History Nancy Hamilton has a past medical history of Allergy, Anemia, Anxiety, Arthritis, Cancer  ([place]), Cough, Depression, GERD (gastroesophageal reflux disease), and Hyperlipidemia.   She has a past surgical history that includes Back surgery (1980); Cataract extraction w/PHACO (Right, 11/28/2015); Colonoscopy; Cataract extraction w/PHACO (Left, 12/19/2015); Colonoscopy with propofol (N/A, 10/10/2017); and polypectomy (10/10/2017).   Her family history includes Breast cancer (age of onset: 64) in her maternal aunt; Diabetes in her brother and paternal grandmother; Fibromyalgia in her sister; Heart disease in her brother; Hyperlipidemia in her sister; Hypertension in her brother.She reports that she quit smoking about 27 years ago. Her smoking use included cigarettes. She has a 7.50 pack-year smoking history. She has never used smokeless tobacco. She reports that she does not drink alcohol and does not use drugs.  Outpatient Medications Prior to Visit  Medication Sig Dispense Refill  . albuterol (VENTOLIN HFA) 108 (90 Base) MCG/ACT inhaler Inhale 2 puffs into the lungs every 6 (six) hours as needed for wheezing or shortness of breath (cough). 18 g 2  . Ascorbic Acid (VITAMIN C) 1000 MG tablet Take 1,000 mg by mouth daily.    . benzonatate (TESSALON) 200 MG capsule Take 1 capsule (200 mg total) by mouth 3 (three) times daily. 90 capsule 3  . budesonide-formoterol (SYMBICORT) 160-4.5 MCG/ACT inhaler Inhale 2 puffs into the lungs 2 (two) times daily. 3 Inhaler 3  . Cholecalciferol (HM VITAMIN D3) 2000 UNITS CAPS Take 1 capsule by mouth daily.    . cyanocobalamin (,VITAMIN B-12,) 1000 MCG/ML injection Inject 1 ml intrumuscularly daily x 3, then weekly x 4, then monthly thereafter 10 mL 2  . docusate sodium (COLACE) 100 MG capsule Take 1 capsule (100 mg total) by mouth 2 (two) times daily. 180 capsule 1  . FLUoxetine (PROZAC) 20 MG capsule TAKE 1 CAPSULE(20 MG) BY MOUTH DAILY 90 capsule 1  . fluticasone (FLONASE) 50 MCG/ACT nasal spray SHAKE LIQUID AND USE 2 SPRAYS IN EACH NOSTRIL EVERY DAY 48 g 1  .  levocetirizine (XYZAL) 5 MG tablet Take 1 tablet (5 mg total) by mouth every evening. 90 tablet 1  . meclizine (ANTIVERT) 12.5 MG tablet Take 1 tablet (12.5 mg total) by mouth 3 (three) times daily as needed for dizziness. 30 tablet 0  . Misc Natural Products (OSTEO BI-FLEX JOINT SHIELD PO) Take 1 tablet by mouth daily.    . pantoprazole (PROTONIX) 40 MG tablet Take 1 tablet (40 mg total) by mouth 2 (two) times daily. 180 tablet 1  . rosuvastatin (CRESTOR) 5 MG tablet Take 1 tablet (5 mg total) by mouth daily. 90 tablet 0  . Spacer/Aero-Holding Chambers (AEROCHAMBER MV) inhaler Use as instructed 1 each 0  . Syringe, Disposable, 1 ML MISC For use with B12 injections 25 each 1  . Ferrous Sulfate (IRON) 325 (65 Fe) MG TABS TAKE 1 TABLET BY MOUTH EVERY DAY WITH BREAKFAST 90 tablet 0  . SHINGRIX injection  (Patient not taking: Reported on 06/22/2020)     No facility-administered medications prior to visit.    Review of Systems   Patient denies headache, fevers, malaise, unintentional weight loss, skin rash, eye pain, sinus congestion and sinus pain, sore throat, dysphagia,  hemoptysis , cough, dyspnea, wheezing, chest pain, palpitations, orthopnea, edema, abdominal pain, nausea, melena, diarrhea, constipation, flank pain, dysuria, hematuria, urinary  Frequency, nocturia, numbness, tingling, seizures,  Focal weakness, Loss of consciousness,  Tremor, insomnia, depression, anxiety, and suicidal ideation.      Objective:  BP 124/72 (BP Location: Left Arm, Patient Position: Sitting, Cuff Size: Normal)   Pulse 69   Temp 98.3 F (36.8 C) (Oral)   Resp 15   Ht 5\' 2"  (1.575 m)   Wt 134 lb 12.8 oz (61.1 kg)   SpO2 97%   BMI 24.66 kg/m   Physical Exam   General appearance: alert, cooperative and appears stated age Head: Normocephalic, without obvious abnormality, atraumatic Eyes: conjunctivae/corneas clear. PERRL, EOM's intact. Fundi benign. Ears: normal TM's and external ear canals both  ears Nose: Nares normal. Septum midline. Mucosa normal. No drainage or sinus tenderness. Throat: lips, mucosa, and tongue normal; teeth and gums normal Neck: no adenopathy, no carotid bruit, no JVD, supple, symmetrical, trachea midline and thyroid not enlarged, symmetric, no tenderness/mass/nodules Lungs: clear to auscultation bilaterally Breasts: normal appearance, no masses or tenderness Heart: regular rate and rhythm, S1, S2 normal, no murmur, click, rub or gallop Abdomen: soft, non-tender; bowel sounds normal; no masses,  no organomegaly Extremities: extremities normal, atraumatic, no cyanosis or edema Pulses: 2+ and symmetric Skin: Skin color, texture, turgor normal. No rashes or lesions Neurologic: Alert and oriented X 3, normal strength and tone. Normal symmetric reflexes. Normal coordination and gait.      Assessment & Plan:   Problem List Items Addressed This Visit      Unprioritized   Iron deficiency - Primary   Relevant Orders   CBC with Differential/Platelet (Completed)   Iron, TIBC and Ferritin Panel (Completed)   Hyperlipidemia LDL goal <100    Mild, tolerating Crestor 5 mg due to incidental finding of aortic atherosclerosis  LFT normal.   Lab Results  Component Value Date   CHOL 177 06/22/2020   HDL 73.40 06/22/2020   LDLCALC 91 06/22/2020   TRIG 64.0 06/22/2020   CHOLHDL 2 06/22/2020           Relevant Orders   Lipid panel (Completed)   Comprehensive metabolic panel (Completed)   TSH (Completed)   Thoracic aortic atherosclerosis ([place])    Noted on chest x ray. And discussed with patient today.  She is tolerating rosuvastatin as preventive ; LDL now < 100  Lab Results  Component Value Date   CHOL 177 06/22/2020   HDL 73.40 06/22/2020   LDLCALC 91 06/22/2020   TRIG 64.0 06/22/2020   CHOLHDL 2 06/22/2020         Visit for preventive health examination    age appropriate education and counseling updated, referrals for preventative services and  immunizations addressed, dietary and smoking counseling addressed, most recent labs reviewed.  I have personally reviewed and have noted:  1) the patient's medical and social history 2) The pt's use of alcohol, tobacco, and illicit drugs 3) The patient's current medications and supplements 4) Functional ability including ADL's, fall risk, home safety risk, hearing and visual impairment 5) Diet and physical activities 6) Evidence for depression or mood disorder 7) The patient's height, weight, and BMI have been recorded in the chart  I have made referrals, and provided counseling and education based on review of the above       Other Visit Diagnoses    Need for 23-polyvalent pneumococcal polysaccharide vaccine       Relevant Orders   Pneumococcal polysaccharide vaccine 23-valent greater than or equal to 2yo subcutaneous/IM (Completed)      I have discontinued Nancy Hamilton's Shingrix.  I am also having her maintain her Cholecalciferol, Misc Natural Products (OSTEO BI-FLEX JOINT SHIELD PO), cyanocobalamin, Syringe (Disposable), meclizine, vitamin C, benzonatate, docusate sodium, AeroChamber MV, budesonide-formoterol, fluticasone, levocetirizine, pantoprazole, albuterol, rosuvastatin, FLUoxetine, and Iron.  Meds ordered this encounter  Medications  . Ferrous Sulfate (IRON) 325 (65 Fe) MG TABS    Sig: TAKE 1 TABLET BY MOUTH EVERY DAY WITH BREAKFAST    Dispense:  90 tablet    Refill:  0    Medications Discontinued During This Encounter  Medication Reason  . SHINGRIX injection   . Ferrous Sulfate (IRON) 325 (65 Fe) MG TABS Reorder    Follow-up: Return in about 6 months (around 12/23/2020).   Crecencio Mc, MD

## 2020-06-22 NOTE — Patient Instructions (Signed)
You can get calcium via calcium supplementation the following   Health Maintenance After Age 73 After age 20, you are at a higher risk for certain long-term diseases and infections as well as injuries from falls. Falls are a major cause of broken bones and head injuries in people who are older than age 33. Getting regular preventive care can help to keep you healthy and well. Preventive care includes getting regular testing and making lifestyle changes as recommended by your health care provider. Talk with your health care provider about:  Which screenings and tests you should have. A screening is a test that checks for a disease when you have no symptoms.  A diet and exercise plan that is right for you. What should I know about screenings and tests to prevent falls? Screening and testing are the best ways to find a health problem early. Early diagnosis and treatment give you the best chance of managing medical conditions that are common after age 73. Certain conditions and lifestyle choices may make you more likely to have a fall. Your health care provider may recommend:  Regular vision checks. Poor vision and conditions such as cataracts can make you more likely to have a fall. If you wear glasses, make sure to get your prescription updated if your vision changes.  Medicine review. Work with your health care provider to regularly review all of the medicines you are taking, including over-the-counter medicines. Ask your health care provider about any side effects that may make you more likely to have a fall. Tell your health care provider if any medicines that you take make you feel dizzy or sleepy.  Osteoporosis screening. Osteoporosis is a condition that causes the bones to get weaker. This can make the bones weak and cause them to break more easily.  Blood pressure screening. Blood pressure changes and medicines to control blood pressure can make you feel dizzy.  Strength and balance checks.  Your health care provider may recommend certain tests to check your strength and balance while standing, walking, or changing positions.  Foot health exam. Foot pain and numbness, as well as not wearing proper footwear, can make you more likely to have a fall.  Depression screening. You may be more likely to have a fall if you have a fear of falling, feel emotionally low, or feel unable to do activities that you used to do.  Alcohol use screening. Using too much alcohol can affect your balance and may make you more likely to have a fall. What actions can I take to lower my risk of falls? General instructions  Talk with your health care provider about your risks for falling. Tell your health care provider if: ? You fall. Be sure to tell your health care provider about all falls, even ones that seem minor. ? You feel dizzy, sleepy, or off-balance.  Take over-the-counter and prescription medicines only as told by your health care provider. These include any supplements.  Eat a healthy diet and maintain a healthy weight. A healthy diet includes low-fat dairy products, low-fat (lean) meats, and fiber from whole grains, beans, and lots of fruits and vegetables. Home safety  Remove any tripping hazards, such as rugs, cords, and clutter.  Install safety equipment such as grab bars in bathrooms and safety rails on stairs.  Keep rooms and walkways well-lit. Activity   Follow a regular exercise program to stay fit. This will help you maintain your balance. Ask your health care provider what types of exercise  are appropriate for you.  If you need a cane or walker, use it as recommended by your health care provider.  Wear supportive shoes that have nonskid soles. Lifestyle  Do not drink alcohol if your health care provider tells you not to drink.  If you drink alcohol, limit how much you have: ? 0-1 drink a day for women. ? 0-2 drinks a day for men.  Be aware of how much alcohol is in your  drink. In the [place] one drink equals one typical bottle of beer (12 oz), one-half glass of wine (5 oz), or one shot of hard liquor (1 oz).  Do not use any products that contain nicotine or tobacco, such as cigarettes and e-cigarettes. If you need help quitting, ask your health care provider. Summary  Having a healthy lifestyle and getting preventive care can help to protect your health and wellness after age 66.  Screening and testing are the best way to find a health problem early and help you avoid having a fall. Early diagnosis and treatment give you the best chance for managing medical conditions that are more common for people who are older than age 36.  Falls are a major cause of broken bones and head injuries in people who are older than age 36. Take precautions to prevent a fall at home.  Work with your health care provider to learn what changes you can make to improve your health and wellness and to prevent falls. This information is not intended to replace advice given to you by your health care provider. Make sure you discuss any questions you have with your health care provider. Document Revised: 02/19/2019 Document Reviewed: 09/11/2017 Elsevier Patient Education  [place]. ys:  Calcium acetate : available otc  Soy milk  :  The most natural way,  Each glass has about 400 mg calcium and NO CHOLESTEROL

## 2020-06-23 LAB — IRON,TIBC AND FERRITIN PANEL
Ferritin: 49 ng/mL (ref 15–150)
Iron Saturation: 16 % (ref 15–55)
Iron: 53 ug/dL (ref 27–139)
Total Iron Binding Capacity: 333 ug/dL (ref 250–450)
UIBC: 280 ug/dL (ref 118–369)

## 2020-06-25 NOTE — Assessment & Plan Note (Signed)
Noted on chest x ray. And discussed with patient today.  She is tolerating rosuvastatin as preventive ; LDL now < 100  Lab Results  Component Value Date   CHOL 177 06/22/2020   HDL 73.40 06/22/2020   LDLCALC 91 06/22/2020   TRIG 64.0 06/22/2020   CHOLHDL 2 06/22/2020

## 2020-06-25 NOTE — Assessment & Plan Note (Signed)
Mild, tolerating Crestor 5 mg due to incidental finding of aortic atherosclerosis  LFT normal.   Lab Results  Component Value Date   CHOL 177 06/22/2020   HDL 73.40 06/22/2020   LDLCALC 91 06/22/2020   TRIG 64.0 06/22/2020   CHOLHDL 2 06/22/2020

## 2020-06-25 NOTE — Assessment & Plan Note (Signed)

## 2020-06-27 ENCOUNTER — Other Ambulatory Visit: Payer: Self-pay | Admitting: Internal Medicine

## 2020-06-27 ENCOUNTER — Telehealth: Payer: Self-pay

## 2020-06-27 DIAGNOSIS — E611 Iron deficiency: Secondary | ICD-10-CM

## 2020-06-27 NOTE — Telephone Encounter (Signed)
LMTCB in regards to lab results.  

## 2020-06-28 ENCOUNTER — Ambulatory Visit (INDEPENDENT_AMBULATORY_CARE_PROVIDER_SITE_OTHER): Payer: PPO

## 2020-06-28 ENCOUNTER — Other Ambulatory Visit: Payer: Self-pay

## 2020-06-28 DIAGNOSIS — E538 Deficiency of other specified B group vitamins: Secondary | ICD-10-CM

## 2020-06-28 MED ORDER — CYANOCOBALAMIN 1000 MCG/ML IJ SOLN
1000.0000 ug | Freq: Once | INTRAMUSCULAR | Status: AC
  Administered 2020-06-28: 1000 ug via INTRAMUSCULAR

## 2020-06-28 NOTE — Progress Notes (Signed)
Patient presented for B 12 injection to right deltoid, patient voiced no concerns nor showed any signs of distress during injection. 

## 2020-07-02 ENCOUNTER — Other Ambulatory Visit: Payer: Self-pay | Admitting: Internal Medicine

## 2020-07-10 ENCOUNTER — Other Ambulatory Visit: Payer: Self-pay | Admitting: Internal Medicine

## 2020-08-02 ENCOUNTER — Other Ambulatory Visit: Payer: Self-pay

## 2020-08-02 ENCOUNTER — Ambulatory Visit (INDEPENDENT_AMBULATORY_CARE_PROVIDER_SITE_OTHER): Payer: PPO

## 2020-08-02 DIAGNOSIS — E538 Deficiency of other specified B group vitamins: Secondary | ICD-10-CM

## 2020-08-02 MED ORDER — CYANOCOBALAMIN 1000 MCG/ML IJ SOLN
1000.0000 ug | Freq: Once | INTRAMUSCULAR | Status: AC
  Administered 2020-08-02: 1000 ug via INTRAMUSCULAR

## 2020-08-02 NOTE — Progress Notes (Signed)
Patient presented for B 12 injection to right deltoid, patient voiced no concerns nor showed any signs of distress during injection. 

## 2020-08-27 ENCOUNTER — Other Ambulatory Visit: Payer: Self-pay | Admitting: Internal Medicine

## 2020-09-06 ENCOUNTER — Ambulatory Visit (INDEPENDENT_AMBULATORY_CARE_PROVIDER_SITE_OTHER): Payer: PPO

## 2020-09-06 ENCOUNTER — Other Ambulatory Visit: Payer: Self-pay

## 2020-09-06 DIAGNOSIS — Z23 Encounter for immunization: Secondary | ICD-10-CM

## 2020-09-06 DIAGNOSIS — E538 Deficiency of other specified B group vitamins: Secondary | ICD-10-CM

## 2020-09-06 MED ORDER — CYANOCOBALAMIN 1000 MCG/ML IJ SOLN
1000.0000 ug | Freq: Once | INTRAMUSCULAR | Status: AC
  Administered 2020-09-06: 1000 ug via INTRAMUSCULAR

## 2020-09-06 NOTE — Progress Notes (Signed)
Patient presented for B 12 injection to right deltoid, patient voiced no concerns nor showed any signs of distress during injection. 

## 2020-09-27 DIAGNOSIS — D2262 Melanocytic nevi of left upper limb, including shoulder: Secondary | ICD-10-CM | POA: Diagnosis not present

## 2020-09-27 DIAGNOSIS — D225 Melanocytic nevi of trunk: Secondary | ICD-10-CM | POA: Diagnosis not present

## 2020-09-27 DIAGNOSIS — D2261 Melanocytic nevi of right upper limb, including shoulder: Secondary | ICD-10-CM | POA: Diagnosis not present

## 2020-09-27 DIAGNOSIS — Z85828 Personal history of other malignant neoplasm of skin: Secondary | ICD-10-CM | POA: Diagnosis not present

## 2020-09-27 DIAGNOSIS — D2271 Melanocytic nevi of right lower limb, including hip: Secondary | ICD-10-CM | POA: Diagnosis not present

## 2020-09-27 DIAGNOSIS — X32XXXA Exposure to sunlight, initial encounter: Secondary | ICD-10-CM | POA: Diagnosis not present

## 2020-09-27 DIAGNOSIS — L570 Actinic keratosis: Secondary | ICD-10-CM | POA: Diagnosis not present

## 2020-10-11 ENCOUNTER — Other Ambulatory Visit: Payer: Self-pay

## 2020-10-11 ENCOUNTER — Ambulatory Visit (INDEPENDENT_AMBULATORY_CARE_PROVIDER_SITE_OTHER): Payer: PPO

## 2020-10-11 DIAGNOSIS — E538 Deficiency of other specified B group vitamins: Secondary | ICD-10-CM | POA: Diagnosis not present

## 2020-10-11 MED ORDER — CYANOCOBALAMIN 1000 MCG/ML IJ SOLN
1000.0000 ug | Freq: Once | INTRAMUSCULAR | Status: AC
  Administered 2020-10-11: 1000 ug via INTRAMUSCULAR

## 2020-10-11 NOTE — Progress Notes (Signed)
Patient presented for B 12 injection to right deltoid, patient voiced no concerns nor showed any signs of distress during injection. 

## 2020-10-24 DIAGNOSIS — Z961 Presence of intraocular lens: Secondary | ICD-10-CM | POA: Diagnosis not present

## 2020-11-15 ENCOUNTER — Ambulatory Visit: Payer: PPO

## 2020-11-17 ENCOUNTER — Ambulatory Visit (INDEPENDENT_AMBULATORY_CARE_PROVIDER_SITE_OTHER): Payer: PPO | Admitting: *Deleted

## 2020-11-17 ENCOUNTER — Other Ambulatory Visit: Payer: Self-pay

## 2020-11-17 DIAGNOSIS — E538 Deficiency of other specified B group vitamins: Secondary | ICD-10-CM

## 2020-11-17 MED ORDER — CYANOCOBALAMIN 1000 MCG/ML IJ SOLN
1000.0000 ug | Freq: Once | INTRAMUSCULAR | Status: AC
  Administered 2020-11-17: 1000 ug via INTRAMUSCULAR

## 2020-11-17 NOTE — Progress Notes (Signed)
Patient presented for B 12 injection to right deltoid, patient voiced no concerns nor showed any signs of distress during injection. 

## 2020-11-22 ENCOUNTER — Ambulatory Visit: Payer: PPO | Admitting: Pulmonary Disease

## 2020-11-22 ENCOUNTER — Encounter: Payer: Self-pay | Admitting: Pulmonary Disease

## 2020-11-22 ENCOUNTER — Other Ambulatory Visit: Payer: Self-pay

## 2020-11-22 VITALS — BP 110/68 | HR 73 | Temp 97.7°F | Ht 62.0 in | Wt 134.8 lb

## 2020-11-22 DIAGNOSIS — R059 Cough, unspecified: Secondary | ICD-10-CM | POA: Diagnosis not present

## 2020-11-22 DIAGNOSIS — J45991 Cough variant asthma: Secondary | ICD-10-CM | POA: Diagnosis not present

## 2020-11-22 DIAGNOSIS — K219 Gastro-esophageal reflux disease without esophagitis: Secondary | ICD-10-CM

## 2020-11-22 NOTE — Progress Notes (Signed)
Subjective:    Patient ID: Nancy Hamilton, female    DOB: Mar 11, 1946, 75 y.o.   MRN: [id number]  HPI This is a 75 year old remote former smoker (quit 1993) who presents for follow-up on the issue of cough variant asthma with a component of LPR trigger.  Positive methacholine test May 2018. Remote smoking history spanned only 15 pack years.  No occupational exposures.  She has been maintained on Symbicort 160/4.5 however because of cost has decreased this to 1 time a day.  The medication should be dosed twice a day.  She does note that when she takes the medication regularly her cough is well controlled.  She has not had any reflux symptoms of late.  She does take PPI on a regular basis.  He has not had any fevers, chills or sweats.  No chest pain.  No increased shortness of breath.  She voices no other complaint.  Review of Systems A 10 point review of systems was performed and it is as noted above otherwise negative.  Patient Active Problem List   Diagnosis Date Noted  . Thoracic aortic atherosclerosis ([place]) 12/21/2018  . Stress incontinence 03/22/2018  . Onychomycosis 03/22/2018  . Personal history of colonic polyps   . Benign neoplasm of descending colon   . Benign paroxysmal positional vertigo of right ear 11/14/2016  . Cervical disc disorder with radiculopathy of cervical region 03/09/2016  . Shoulder pain, right 03/09/2016  . Statin intolerance 06/08/2014  . B12 deficiency 06/07/2014  . Iron deficiency 06/07/2014  . Adenomatous polyps 02/28/2014  . Anemia 02/26/2014  . Visit for preventive health examination 02/20/2013  . Generalized anxiety disorder 12/01/2012  . GERD (gastroesophageal reflux disease) 12/01/2012  . Chronic cough 12/01/2012  . Hyperlipidemia LDL goal <100 12/01/2012   Allergies  Allergen Reactions  . Tylox [Oxycodone-Acetaminophen] Rash   Current Meds  Medication Sig  . albuterol (VENTOLIN HFA) 108 (90 Base) MCG/ACT inhaler Inhale 2 puffs into the  lungs every 6 (six) hours as needed for wheezing or shortness of breath (cough).  . Ascorbic Acid (VITAMIN C) 1000 MG tablet Take 1,000 mg by mouth daily.  . benzonatate (TESSALON) 200 MG capsule Take 1 capsule (200 mg total) by mouth 3 (three) times daily.  . budesonide-formoterol (SYMBICORT) 160-4.5 MCG/ACT inhaler Inhale 2 puffs into the lungs 2 (two) times daily.  . Cholecalciferol 50 MCG (2000 UT) CAPS Take 1 capsule by mouth daily.  . cyanocobalamin (,VITAMIN B-12,) 1000 MCG/ML injection Inject 1 ml intrumuscularly daily x 3, then weekly x 4, then monthly thereafter  . docusate sodium (COLACE) 100 MG capsule Take 1 capsule (100 mg total) by mouth 2 (two) times daily.  Marland Kitchen FLUoxetine (PROZAC) 20 MG capsule TAKE 1 CAPSULE(20 MG) BY MOUTH DAILY  . fluticasone (FLONASE) 50 MCG/ACT nasal spray SHAKE LIQUID AND USE 2 SPRAYS IN EACH NOSTRIL EVERY DAY  . levocetirizine (XYZAL) 5 MG tablet TAKE 1 TABLET(5 MG) BY MOUTH EVERY EVENING  . meclizine (ANTIVERT) 12.5 MG tablet Take 1 tablet (12.5 mg total) by mouth 3 (three) times daily as needed for dizziness.  . Misc Natural Products (OSTEO BI-FLEX JOINT SHIELD PO) Take 1 tablet by mouth daily.  . pantoprazole (PROTONIX) 40 MG tablet Take 1 tablet (40 mg total) by mouth 2 (two) times daily.  . rosuvastatin (CRESTOR) 5 MG tablet TAKE 1 TABLET(5 MG) BY MOUTH DAILY  . Spacer/Aero-Holding Chambers (AEROCHAMBER MV) inhaler Use as instructed  . Syringe, Disposable, 1 ML MISC For use with B12  injections   Immunization History  Administered Date(s) Administered  . Fluad Quad(high Dose 65+) 08/25/2019, 09/06/2020  . Influenza Split 08/10/2012  . Influenza, High Dose Seasonal PF 09/14/2016, 09/03/2017, 08/19/2018  . Influenza,inj,Quad PF,6+ Mos 08/20/2013, 08/26/2014, 09/06/2015  . Moderna Sars-Covid-2 Vaccination 12/22/2019, 01/19/2020, 09/26/2020  . Pneumococcal Conjugate-13 02/25/2014  . Pneumococcal Polysaccharide-23 02/18/2013, 06/22/2020  . Tdap  12/01/2010  . Zoster 10/20/2015  . Zoster Recombinat (Shingrix) 10/23/2019, 02/10/2020       Objective:   Physical Exam BP 110/68 (BP Location: Left Arm, Cuff Size: Normal)   Pulse 73   Temp 97.7 F (36.5 C) (Temporal)   Ht 5\' 2"  (1.575 m)   Wt 134 lb 12.8 oz (61.1 kg)   SpO2 99%   BMI 24.66 kg/m   GENERAL: Well-developed well-nourished, fit appearing, fully ambulatory, no acute distress. HEAD: Normocephalic, atraumatic.  EYES: Pupils equal, round, reactive to light.  No scleral icterus.  MOUTH: Nose/mouth/throat not examined due to masking requirements for COVID 19. NECK: Supple. No thyromegaly. Trachea midline. No JVD.  No adenopathy. PULMONARY: Lungs clear to auscultation bilaterally.  Good air entry bilaterally. CARDIOVASCULAR: S1 and S2. Regular rate and rhythm.  No rubs, murmurs or gallops heard. GASTROINTESTINAL: No abdominal distention noted. MUSCULOSKELETAL: No joint deformity, no clubbing, no edema.  NEUROLOGIC: Awake, alert, fully oriented.  No focal deficit noted.  No gait disturbance noted. SKIN: Intact,warm,dry.  On limited exam no rashes. PSYCH: Mood and behavior appropriate.      Assessment & Plan:     ICD-10-CM   1. Cough variant asthma  J45.991 Pulmonary Function Test [facility] Only   Instructed to take Symbicort twice a day She just got a 10-month supply Will revisit in 3 months query change to [facility] PFTs on follow-up  2. Laryngopharyngeal reflux (LPR)  K21.9    Well-controlled recently No reflux issues  3. Cough  R05.9    Exacerbates with poor asthma control Suppressed if takes inhaler twice a day   Orders Placed This Encounter  Procedures  . Pulmonary Function Test [facility] Only    Standing Status:   Future    Standing Expiration Date:   11/22/2021    Scheduling Instructions:     68mo    Order Specific Question:   Full PFT: includes the following: basic spirometry, spirometry pre & post bronchodilator, diffusion capacity (DLCO), lung volumes    Answer:    Full PFT   Discussion:  Patient has cough variant asthma.  Recently had had increase in cough due to decreasing her Symbicort to a once a day dosing due to cost.  She has just recently received a 78-month supply of medication and does not wish to change to a once daily medication.  We will see her in 3 months and revisit this issue with her and consider switching her to [facility] which is a truly once a day medication.  This appears to be covered by her plan.  We will obtain PFTs to reevaluate her lung function upon return.  She is to call us prior to her follow-up appointment should any new difficulties arise.   Renold Don, MD [facility]   *This note was dictated using voice recognition software/Dragon.  Despite best efforts to proofread, errors can occur which can change the meaning.  Any change was purely unintentional.

## 2020-11-22 NOTE — Patient Instructions (Signed)
Do use your inhaler twice a day (Symbicort)  We will see him in follow-up in 3 months time at that time we will revisit the potential of switching you to a once daily preparation of inhaler  We will get breathing test before your follow-up appointment.

## 2020-12-06 ENCOUNTER — Ambulatory Visit (INDEPENDENT_AMBULATORY_CARE_PROVIDER_SITE_OTHER): Payer: PPO

## 2020-12-06 ENCOUNTER — Telehealth: Payer: Self-pay

## 2020-12-06 VITALS — Ht 62.0 in | Wt 134.0 lb

## 2020-12-06 DIAGNOSIS — Z0000 Encounter for general adult medical examination without abnormal findings: Secondary | ICD-10-CM | POA: Diagnosis not present

## 2020-12-06 MED ORDER — TETANUS-DIPHTH-ACELL PERTUSSIS 5-2.5-18.5 LF-MCG/0.5 IM SUSY: 0.5000 mL | PREFILLED_SYRINGE | Freq: Once | INTRAMUSCULAR | 0 refills | Status: AC

## 2020-12-06 MED ORDER — TETANUS-DIPHTH-ACELL PERTUSSIS 5-2.5-18.5 LF-MCG/0.5 IM SUSP: 0.5000 mL | Freq: Once | INTRAMUSCULAR | 0 refills | Status: DC

## 2020-12-06 NOTE — Patient Instructions (Addendum)
Nancy Hamilton , Thank you for taking time to come for your Medicare Wellness Visit. I appreciate your ongoing commitment to your health goals. Please review the following plan we discussed and let me know if I can assist you in the future.   These are the goals we discussed: Goals    . DIET - INCREASE LEAN PROTEINS     Weight 127lb Low carb diet    . Increase physical activity     Walk for exercise        This is a list of the screening recommended for you and due dates:  Health Maintenance  Topic Date Due  . Tetanus Vaccine  12/01/2020  . Mammogram  05/31/2021  . Colon Cancer Screening  10/10/2022  . Flu Shot  Completed  . DEXA scan (bone density measurement)  Completed  . COVID-19 Vaccine  Completed  .  Hepatitis C: One time screening is recommended by Center for Disease Control  (CDC) for  adults born from [birth year] through 1964.   Completed  . Pneumonia vaccines  Completed    Immunizations Immunization History  Administered Date(s) Administered  . Fluad Quad(high Dose 65+) 08/25/2019, 09/06/2020  . Influenza Split 08/10/2012  . Influenza, High Dose Seasonal PF 09/14/2016, 09/03/2017, 08/19/2018  . Influenza,inj,Quad PF,6+ Mos 08/20/2013, 08/26/2014, 09/06/2015  . Moderna Sars-Covid-2 Vaccination 12/22/2019, 01/19/2020, 09/26/2020  . Pneumococcal Conjugate-13 02/25/2014  . Pneumococcal Polysaccharide-23 02/18/2013, 06/22/2020  . Tdap 12/01/2010  . Zoster 10/20/2015  . Zoster Recombinat (Shingrix) 10/23/2019, 02/10/2020   Keep all routine maintenance appointments.   Pick up Tdap Rx from front office.  Advanced directives: on file  Conditions/risks identified: none new   Follow up in one year for your annual wellness visit    Preventive Care 65 Years and Older, Female Preventive care refers to lifestyle choices and visits with your health care provider that can promote health and wellness. What does preventive care include?  A yearly physical exam. This is also  called an annual well check.  Dental exams once or twice a year.  Routine eye exams. Ask your health care provider how often you should have your eyes checked.  Personal lifestyle choices, including:  Daily care of your teeth and gums.  Regular physical activity.  Eating a healthy diet.  Avoiding tobacco and drug use.  Limiting alcohol use.  Practicing safe sex.  Taking low-dose aspirin every day.  Taking vitamin and mineral supplements as recommended by your health care provider. What happens during an annual well check? The services and screenings done by your health care provider during your annual well check will depend on your age, overall health, lifestyle risk factors, and family history of disease. Counseling  Your health care provider may ask you questions about your:  Alcohol use.  Tobacco use.  Drug use.  Emotional well-being.  Home and relationship well-being.  Sexual activity.  Eating habits.  History of falls.  Memory and ability to understand (cognition).  Work and work Statistician.  Reproductive health. Screening  You may have the following tests or measurements:  Height, weight, and BMI.  Blood pressure.  Lipid and cholesterol levels. These may be checked every 5 years, or more frequently if you are over 77 years old.  Skin check.  Lung cancer screening. You may have this screening every year starting at age 11 if you have a 30-pack-year history of smoking and currently smoke or have quit within the past 15 years.  Fecal occult blood test (FOBT) of  the stool. You may have this test every year starting at age 62.  Flexible sigmoidoscopy or colonoscopy. You may have a sigmoidoscopy every 5 years or a colonoscopy every 10 years starting at age 53.  Hepatitis C blood test.  Hepatitis B blood test.  Sexually transmitted disease (STD) testing.  Diabetes screening. This is done by checking your blood sugar (glucose) after you have not  eaten for a while (fasting). You may have this done every 1-3 years.  Bone density scan. This is done to screen for osteoporosis. You may have this done starting at age 27.  Mammogram. This may be done every 1-2 years. Talk to your health care provider about how often you should have regular mammograms. Talk with your health care provider about your test results, treatment options, and if necessary, the need for more tests. Vaccines  Your health care provider may recommend certain vaccines, such as:  Influenza vaccine. This is recommended every year.  Tetanus, diphtheria, and acellular pertussis (Tdap, Td) vaccine. You may need a Td booster every 10 years.  Zoster vaccine. You may need this after age 66.  Pneumococcal 13-valent conjugate (PCV13) vaccine. One dose is recommended after age 68.  Pneumococcal polysaccharide (PPSV23) vaccine. One dose is recommended after age 24. Talk to your health care provider about which screenings and vaccines you need and how often you need them. This information is not intended to replace advice given to you by your health care provider. Make sure you discuss any questions you have with your health care provider. Document Released: 11/25/2015 Document Revised: 07/18/2016 Document Reviewed: 08/30/2015 Elsevier Interactive Patient Education  2017 [place] Prevention in the Home Falls can cause injuries. They can happen to people of all ages. There are many things you can do to make your home safe and to help prevent falls. What can I do on the outside of my home?  Regularly fix the edges of walkways and driveways and fix any cracks.  Remove anything that might make you trip as you walk through a door, such as a raised step or threshold.  Trim any bushes or trees on the path to your home.  Use bright outdoor lighting.  Clear any walking paths of anything that might make someone trip, such as rocks or tools.  Regularly check to see if  handrails are loose or broken. Make sure that both sides of any steps have handrails.  Any raised decks and porches should have guardrails on the edges.  Have any leaves, snow, or ice cleared regularly.  Use sand or salt on walking paths during winter.  Clean up any spills in your garage right away. This includes oil or grease spills. What can I do in the bathroom?  Use night lights.  Install grab bars by the toilet and in the tub and shower. Do not use towel bars as grab bars.  Use non-skid mats or decals in the tub or shower.  If you need to sit down in the shower, use a plastic, non-slip stool.  Keep the floor dry. Clean up any water that spills on the floor as soon as it happens.  Remove soap buildup in the tub or shower regularly.  Attach bath mats securely with double-sided non-slip rug tape.  Do not have throw rugs and other things on the floor that can make you trip. What can I do in the bedroom?  Use night lights.  Make sure that you have a light by your  bed that is easy to reach.  Do not use any sheets or blankets that are too big for your bed. They should not hang down onto the floor.  Have a firm chair that has side arms. You can use this for support while you get dressed.  Do not have throw rugs and other things on the floor that can make you trip. What can I do in the kitchen?  Clean up any spills right away.  Avoid walking on wet floors.  Keep items that you use a lot in easy-to-reach places.  If you need to reach something above you, use a strong step stool that has a grab bar.  Keep electrical cords out of the way.  Do not use floor polish or wax that makes floors slippery. If you must use wax, use non-skid floor wax.  Do not have throw rugs and other things on the floor that can make you trip. What can I do with my stairs?  Do not leave any items on the stairs.  Make sure that there are handrails on both sides of the stairs and use them. Fix  handrails that are broken or loose. Make sure that handrails are as long as the stairways.  Check any carpeting to make sure that it is firmly attached to the stairs. Fix any carpet that is loose or worn.  Avoid having throw rugs at the top or bottom of the stairs. If you do have throw rugs, attach them to the floor with carpet tape.  Make sure that you have a light switch at the top of the stairs and the bottom of the stairs. If you do not have them, ask someone to add them for you. What else can I do to help prevent falls?  Wear shoes that:  Do not have high heels.  Have rubber bottoms.  Are comfortable and fit you well.  Are closed at the toe. Do not wear sandals.  If you use a stepladder:  Make sure that it is fully opened. Do not climb a closed stepladder.  Make sure that both sides of the stepladder are locked into place.  Ask someone to hold it for you, if possible.  Clearly mark and make sure that you can see:  Any grab bars or handrails.  First and last steps.  Where the edge of each step is.  Use tools that help you move around (mobility aids) if they are needed. These include:  Canes.  Walkers.  Scooters.  Crutches.  Turn on the lights when you go into a dark area. Replace any light bulbs as soon as they burn out.  Set up your furniture so you have a clear path. Avoid moving your furniture around.  If any of your floors are uneven, fix them.  If there are any pets around you, be aware of where they are.  Review your medicines with your doctor. Some medicines can make you feel dizzy. This can increase your chance of falling. Ask your doctor what other things that you can do to help prevent falls. This information is not intended to replace advice given to you by your health care provider. Make sure you discuss any questions you have with your health care provider. Document Released: 08/25/2009 Document Revised: 04/05/2016 Document Reviewed:  12/03/2014 Elsevier Interactive Patient Education  2017 [employer].

## 2020-12-06 NOTE — Telephone Encounter (Signed)
rx has been printed and placed up front for pick up.

## 2020-12-06 NOTE — Progress Notes (Addendum)
Subjective:   Nancy Hamilton is a 75 y.o. female who presents for Medicare Annual (Subsequent) preventive examination.  Review of Systems    No ROS.  Medicare Wellness Virtual Visit.  Cardiac Risk Factors include: advanced age (>5men, >62 women)     Objective:    Today's Vitals   12/06/20 1012  Weight: 134 lb (60.8 kg)  Height: 5\' 2"  (1.575 m)   Body mass index is 24.51 kg/m.  Advanced Directives 12/06/2020 12/04/2019 06/26/2018 10/10/2017 06/25/2017 12/19/2015 11/28/2015  Does Patient Have a Medical Advance Directive? Yes Yes Yes Yes No No No  Type of Paramedic of [place];Living will [place];Living will [place];Living will [place];Living will - - -  Does patient want to make changes to medical advance directive? No - Patient declined No - Patient declined No - Patient declined - Yes (MAU/Ambulatory/Procedural Areas - Information given) - -  Copy of [place] in Chart? No - copy requested Yes - validated most recent copy scanned in chart (See row information) No - copy requested No - copy requested - - -  Would patient like information on creating a medical advance directive? - - - - - - No - patient declined information    Current Medications (verified) Outpatient Encounter Medications as of 12/06/2020  Medication Sig  . albuterol (VENTOLIN HFA) 108 (90 Base) MCG/ACT inhaler Inhale 2 puffs into the lungs every 6 (six) hours as needed for wheezing or shortness of breath (cough).  . Ascorbic Acid (VITAMIN C) 1000 MG tablet Take 1,000 mg by mouth daily.  . benzonatate (TESSALON) 200 MG capsule Take 1 capsule (200 mg total) by mouth 3 (three) times daily.  . budesonide-formoterol (SYMBICORT) 160-4.5 MCG/ACT inhaler Inhale 2 puffs into the lungs 2 (two) times daily.  . Cholecalciferol 50 MCG (2000 UT) CAPS Take 1 capsule by mouth daily.  . cyanocobalamin (,VITAMIN B-12,) 1000  MCG/ML injection Inject 1 ml intrumuscularly daily x 3, then weekly x 4, then monthly thereafter  . docusate sodium (COLACE) 100 MG capsule Take 1 capsule (100 mg total) by mouth 2 (two) times daily.  . Ferrous Sulfate (IRON) 325 (65 Fe) MG TABS TAKE 1 TABLET BY MOUTH EVERY DAY WITH BREAKFAST (Patient not taking: Reported on 11/22/2020)  . FLUoxetine (PROZAC) 20 MG capsule TAKE 1 CAPSULE(20 MG) BY MOUTH DAILY  . fluticasone (FLONASE) 50 MCG/ACT nasal spray SHAKE LIQUID AND USE 2 SPRAYS IN EACH NOSTRIL EVERY DAY  . levocetirizine (XYZAL) 5 MG tablet TAKE 1 TABLET(5 MG) BY MOUTH EVERY EVENING  . meclizine (ANTIVERT) 12.5 MG tablet Take 1 tablet (12.5 mg total) by mouth 3 (three) times daily as needed for dizziness.  . Misc Natural Products (OSTEO BI-FLEX JOINT SHIELD PO) Take 1 tablet by mouth daily.  . pantoprazole (PROTONIX) 40 MG tablet Take 1 tablet (40 mg total) by mouth 2 (two) times daily.  . rosuvastatin (CRESTOR) 5 MG tablet TAKE 1 TABLET(5 MG) BY MOUTH DAILY  . Spacer/Aero-Holding Chambers (AEROCHAMBER MV) inhaler Use as instructed  . Syringe, Disposable, 1 ML MISC For use with B12 injections   No facility-administered encounter medications on file as of 12/06/2020.    Allergies (verified) Tylox [oxycodone-acetaminophen]   History: Past Medical History:  Diagnosis Date  . Allergy   . Anemia   . Anxiety   . Arthritis    neck, no limitations  . Cancer ([place])    SKIN  . Cough  CHRONIC FROM REFLUX  . Depression   . GERD (gastroesophageal reflux disease)    SLEEPS WITH HOB ELEVATED  . Hyperlipidemia    Past Surgical History:  Procedure Laterality Date  . BACK SURGERY  1980   Ruptured Disc  . CATARACT EXTRACTION W/PHACO Right 11/28/2015   Procedure: CATARACT EXTRACTION PHACO AND INTRAOCULAR LENS PLACEMENT (IOC);  Surgeon: Estill Cotta, MD;  Location: [facility];  Service: Ophthalmology;  Laterality: Right;  [place] AP% CDE fluid pack lot # [id number]  . CATARACT EXTRACTION  W/PHACO Left 12/19/2015   Procedure: CATARACT EXTRACTION PHACO AND INTRAOCULAR LENS PLACEMENT (IOC);  Surgeon: Estill Cotta, MD;  Location: [facility];  Service: Ophthalmology;  Laterality: Left;  US:1:08.2 AP%:56.7% CDE:28.10 Lot# [id number]  . COLONOSCOPY    . COLONOSCOPY WITH PROPOFOL N/A 10/10/2017   Procedure: COLONOSCOPY WITH PROPOFOL;  Surgeon: Lucilla Lame, MD;  Location: [facility];  Service: Endoscopy;  Laterality: N/A;  . POLYPECTOMY  10/10/2017   Procedure: POLYPECTOMY;  Surgeon: Lucilla Lame, MD;  Location: [facility];  Service: Endoscopy;;   Family History  Problem Relation Age of Onset  . Hyperlipidemia Sister   . Fibromyalgia Sister   . Breast cancer Maternal Aunt 75  . Heart disease Brother   . Diabetes Brother   . Hypertension Brother   . Diabetes Paternal Grandmother    Social History   Socioeconomic History  . Marital status: Widowed    Spouse name: Not on file  . Number of children: Not on file  . Years of education: Not on file  . Highest education level: Not on file  Occupational History  . Not on file  Tobacco Use  . Smoking status: Former Smoker    Packs/day: 0.75    Years: 15.00    Pack years: 11.25    Types: Cigarettes    Quit date: 08/10/1992    Years since quitting: 28.3  . Smokeless tobacco: Never Used  Vaping Use  . Vaping Use: Never used  Substance and Sexual Activity  . Alcohol use: No  . Drug use: No  . Sexual activity: Not Currently  Other Topics Concern  . Not on file  Social History Narrative   Marital status: widowed.      Living with: alone.      Employment: none.      Tobacco: former user.      Alcohol: none      Drugs: none      Exercise: none; she tends to the farm.    Social Determinants of Health   Financial Resource Strain: Low Risk   . Difficulty of Paying Living Expenses: Not hard at all  Food Insecurity: No Food Insecurity  . Worried About Charity fundraiser in the Last Year: Never true  .  Ran Out of Food in the Last Year: Never true  Transportation Needs: No Transportation Needs  . Lack of Transportation (Medical): No  . Lack of Transportation (Non-Medical): No  Physical Activity: Sufficiently Active  . Days of Exercise per Week: 5 days  . Minutes of Exercise per Session: 60 min  Stress: No Stress Concern Present  . Feeling of Stress : Not at all  Social Connections: Moderately Integrated  . Frequency of Communication with Friends and Family: More than three times a week  . Frequency of Social Gatherings with Friends and Family: More than three times a week  . Attends Religious Services: More than 4 times per year  . Active Member of Clubs or  Organizations: Yes  . Attends Banker Meetings: More than 4 times per year  . Marital Status: Widowed    Tobacco Counseling Counseling given: Not Answered   Clinical Intake:  Pre-visit preparation completed: Yes        Diabetes: No  How often do you need to have someone help you when you read instructions, pamphlets, or other written materials from your doctor or pharmacy?: 1 - Never  Interpreter Needed?: No      Activities of Daily Living In your present state of health, do you have any difficulty performing the following activities: 12/06/2020  Hearing? N  Vision? N  Difficulty concentrating or making decisions? N  Walking or climbing stairs? N  Dressing or bathing? N  Doing errands, shopping? N  Preparing Food and eating ? N  Using the Toilet? N  In the past six months, have you accidently leaked urine? N  Do you have problems with loss of bowel control? N  Managing your Medications? N  Managing your Finances? N  Housekeeping or managing your Housekeeping? N  Some recent data might be hidden    Patient Care Team: Sherlene Shams, MD as PCP - General (Internal Medicine)  Indicate any recent Medical Services you may have received from other than [facility] providers in the past year (date may be  approximate).     Assessment:   This is a routine wellness examination for Nancy Hamilton.  I connected with Nancy Hamilton today by telephone and verified that I am speaking with the correct person using two identifiers. Location patient: home Location provider: work Persons participating in the virtual visit: patient, Engineer, civil (consulting).    I discussed the limitations, risks, security and privacy concerns of performing an evaluation and management service by telephone and the availability of in person appointments. The patient expressed understanding and verbally consented to this telephonic visit.    Interactive audio and video telecommunications were attempted between this provider and patient, however failed, due to patient having technical difficulties OR patient did not have access to video capability.  We continued and completed visit with audio only.  Some vital signs may be absent or patient reported.   Hearing/Vision screen  Hearing Screening   125Hz  250Hz  500Hz  1000Hz  2000Hz  3000Hz  4000Hz  6000Hz  8000Hz   Right ear:           Left ear:           Comments: Patient is able to hear conversational tones without difficulty. No issues reported.   Vision Screening Comments: Followed by [facility]  Wears corrective lenses when reading  Annual visits Cataract extraction, bilateral  Virtual visit  Dietary issues and exercise activities discussed: Current Exercise Habits: Home exercise routine, Type of exercise: walking, Time (Minutes): 60, Frequency (Times/Week): 5, Weekly Exercise (Minutes/Week): 300, Intensity: Mild  Healthy diet Good water intake  Goals    . DIET - INCREASE LEAN PROTEINS     Weight 127lb Low carb diet    . Increase physical activity     Walk for exercise       Depression Screen PHQ 2/9 Scores 12/06/2020 12/04/2019 06/26/2018 06/25/2017 03/11/2017 03/02/2015 02/25/2014  PHQ - 2 Score 0 1 0 0 0 1 0  PHQ- 9 Score - - - 0 1 - -    Fall Risk Fall Risk  12/06/2020 06/22/2020  12/04/2019 06/26/2018 06/25/2017  Falls in the past year? 0 0 0 No Yes  Number falls in past yr: 0 - - - 1  Injury with Fall? 0 - - - No  Follow up Falls evaluation completed Falls evaluation completed Falls evaluation completed - Falls prevention discussed;Education provided    FALL RISK PREVENTION PERTAINING TO THE HOME: Handrails in use when climbing stairs? Yes Home free of loose throw rugs in walkways, pet beds, electrical cords, etc? Yes  Adequate lighting in your home to reduce risk of falls? Yes   ASSISTIVE DEVICES UTILIZED TO PREVENT FALLS: Use of a cane, walker or w/c? No   TIMED UP AND GO: Was the test performed? No . Virtual visit.   Cognitive Function: MMSE - Mini Mental State Exam 06/26/2018  Orientation to time 5  Orientation to Place 5  Registration 3  Attention/ Calculation 5  Recall 3  Language- name 2 objects 2  Language- repeat 1  Language- follow 3 step command 3  Language- read & follow direction 1  Write a sentence 1  Copy design 1  Total score 30     6CIT Screen 12/04/2019  What Year? 0 points  What month? 0 points  What time? 0 points    Immunizations Immunization History  Administered Date(s) Administered  . Fluad Quad(high Dose 65+) 08/25/2019, 09/06/2020  . Influenza Split 08/10/2012  . Influenza, High Dose Seasonal PF 09/14/2016, 09/03/2017, 08/19/2018  . Influenza,inj,Quad PF,6+ Mos 08/20/2013, 08/26/2014, 09/06/2015  . Moderna Sars-Covid-2 Vaccination 12/22/2019, 01/19/2020, 09/26/2020  . Pneumococcal Conjugate-13 02/25/2014  . Pneumococcal Polysaccharide-23 02/18/2013, 06/22/2020  . Tdap 12/01/2010  . Zoster 10/20/2015  . Zoster Recombinat (Shingrix) 10/23/2019, 02/10/2020   Health Maintenance Health Maintenance  Topic Date Due  . TETANUS/TDAP  12/01/2020  . MAMMOGRAM  05/31/2021  . COLONOSCOPY (Pts 45-56yrs Insurance coverage will need to be confirmed)  10/10/2022  . INFLUENZA VACCINE  Completed  . DEXA SCAN  Completed  .  COVID-19 Vaccine  Completed  . Hepatitis C Screening  Completed  . PNA vac Low Risk Adult  Completed    Colorectal cancer screening: Type of screening: Colonoscopy. Completed 10/10/17. Repeat every 5 years.  Mammogram status: Completed 05/31/20. Repeat every year. MM 3D SCREEN BREAST BILATERAL   Bone density- 05/22/17. Cholecalciferol 50 MCG (2000 UT) CAPS.  Lung Cancer Screening: (Low Dose CT Chest recommended if Age 88-80 years, 30 pack-year currently smoking OR have quit w/in 15years.) does not qualify.   Hepatitis C Screening: Completed 03/02/15.   Vision Screening: Recommended annual ophthalmology exams for early detection of glaucoma and other disorders of the eye. Is the patient up to date with their annual eye exam?  Yes   Dental Screening: Recommended annual dental exams for proper oral hygiene.  Community Resource Referral / Chronic Care Management: CRR required this visit?  No   CCM required this visit?  No      Plan:   Keep all routine maintenance appointments.   Follow up 12/20/20 @ 11:00 B12 injection  I have personally reviewed and noted the following in the patient's chart:   . Medical and social history . Use of alcohol, tobacco or illicit drugs  . Current medications and supplements . Functional ability and status . Nutritional status . Physical activity . Advanced directives . List of other physicians . Hospitalizations, surgeries, and ER visits in previous 12 months . Vitals . Screenings to include cognitive, depression, and falls . Referrals and appointments  In addition, I have reviewed and discussed with patient certain preventive protocols, quality metrics, and best practice recommendations. A written personalized care plan for preventive services as well as general  preventive health recommendations were provided to patient.     Ashok Pall, LPN   [id number]/88/[id number]   Nurse Notes: Request Tdap Rx left up front to pick up 12/20/20.    Agree  with plan. Rennie Plowman, NP

## 2020-12-06 NOTE — Telephone Encounter (Signed)
-----   Message from Dia Crawford, LPN sent at 1/93/7902 10:27 AM EST ----- Regarding: Tdap Rx Hi Jess!   Can you print a Tdap Rx and leave up front for this patient to pick up anytime before 2/8?   She would like to pick it up when she comes to the ofc for her next scheduled B12.   Thanks so much,  Denisa

## 2020-12-20 ENCOUNTER — Ambulatory Visit (INDEPENDENT_AMBULATORY_CARE_PROVIDER_SITE_OTHER): Payer: PPO

## 2020-12-20 ENCOUNTER — Other Ambulatory Visit: Payer: Self-pay

## 2020-12-20 DIAGNOSIS — E538 Deficiency of other specified B group vitamins: Secondary | ICD-10-CM | POA: Diagnosis not present

## 2020-12-20 MED ORDER — CYANOCOBALAMIN 1000 MCG/ML IJ SOLN
1000.0000 ug | Freq: Once | INTRAMUSCULAR | Status: AC
  Administered 2020-12-20: 1000 ug via INTRAMUSCULAR

## 2020-12-20 NOTE — Progress Notes (Signed)
Patient presented for B 12 injection to left deltoid, patient voiced no concerns nor showed any signs of distress during injection. 

## 2020-12-26 ENCOUNTER — Other Ambulatory Visit: Payer: Self-pay

## 2020-12-26 MED ORDER — TETANUS-DIPHTH-ACELL PERTUSSIS 5-2.5-18.5 LF-MCG/0.5 IM SUSY: 0.5000 mL | PREFILLED_SYRINGE | Freq: Once | INTRAMUSCULAR | 0 refills | Status: AC

## 2021-01-23 DIAGNOSIS — D485 Neoplasm of uncertain behavior of skin: Secondary | ICD-10-CM | POA: Diagnosis not present

## 2021-01-23 DIAGNOSIS — L308 Other specified dermatitis: Secondary | ICD-10-CM | POA: Diagnosis not present

## 2021-01-24 ENCOUNTER — Other Ambulatory Visit: Payer: Self-pay

## 2021-01-24 ENCOUNTER — Other Ambulatory Visit: Payer: Self-pay | Admitting: Internal Medicine

## 2021-01-24 ENCOUNTER — Ambulatory Visit (INDEPENDENT_AMBULATORY_CARE_PROVIDER_SITE_OTHER): Payer: PPO | Admitting: *Deleted

## 2021-01-24 DIAGNOSIS — E538 Deficiency of other specified B group vitamins: Secondary | ICD-10-CM

## 2021-01-24 MED ORDER — CYANOCOBALAMIN 1000 MCG/ML IJ SOLN
1000.0000 ug | Freq: Once | INTRAMUSCULAR | Status: AC
  Administered 2021-01-24: 1000 ug via INTRAMUSCULAR

## 2021-01-24 NOTE — Progress Notes (Signed)
Patient presented for B 12 injection to right deltoid, patient voiced no concerns nor showed any signs of distress during injection. 

## 2021-01-27 ENCOUNTER — Telehealth: Payer: Self-pay | Admitting: Pulmonary Disease

## 2021-01-27 NOTE — Telephone Encounter (Signed)
Called and spoke to patient.  Patient is scheduled for ROV on 02/22/2021, however she has a dentist  appointment at 10:30 on the same day.  PFT was ordered at last OV, but has not been scheduled.  Patient is aware that PFT should be completed before her visit unless she develops any new or worsened sx.  OV rescheduled for 03/16/2021 at 11:30. PFT will need to be preformed in April.  Patient is aware that our office will contact her to schedule PFT once April's schedule is open.   Rodena Piety, can you add patient to April list for PFT. Thanks

## 2021-02-02 NOTE — Telephone Encounter (Signed)
I have spoke with Nancy Hamilton and she is aware that her Covid test is on 02/13/21 @ 9:45am at the [facility] and her PFT has been scheduled on 02/14/21 @ 1:00pm at the [facility] Entrance arrival time 12:45pm

## 2021-02-10 ENCOUNTER — Telehealth: Payer: Self-pay

## 2021-02-10 NOTE — Telephone Encounter (Signed)
ATC patient for reminder of covid test prior to PFT. Unable to leave vm due to mailbox not being setup.  [facility]

## 2021-02-13 ENCOUNTER — Other Ambulatory Visit: Payer: Self-pay

## 2021-02-13 ENCOUNTER — Other Ambulatory Visit
Admission: RE | Admit: 2021-02-13 | Discharge: 2021-02-13 | Disposition: A | Discharge status: Home / Self Care | Payer: PPO | Source: Ambulatory Visit | Attending: Pulmonary Disease | Admitting: Pulmonary Disease

## 2021-02-13 DIAGNOSIS — Z01812 Encounter for preprocedural laboratory examination: Secondary | ICD-10-CM | POA: Insufficient documentation

## 2021-02-13 DIAGNOSIS — Z20822 Contact with and (suspected) exposure to covid-19: Secondary | ICD-10-CM | POA: Insufficient documentation

## 2021-02-13 LAB — SARS CORONAVIRUS 2 (TAT 6-24 HRS): SARS Coronavirus 2: NEGATIVE

## 2021-02-13 NOTE — Telephone Encounter (Signed)
Patient is aware of date/time of covid test.   

## 2021-02-14 ENCOUNTER — Ambulatory Visit: Payer: PPO | Attending: Pulmonary Disease

## 2021-02-14 ENCOUNTER — Other Ambulatory Visit: Payer: Self-pay

## 2021-02-14 DIAGNOSIS — J45991 Cough variant asthma: Secondary | ICD-10-CM

## 2021-02-14 MED ORDER — ALBUTEROL SULFATE (2.5 MG/3ML) 0.083% IN NEBU
2.5000 mg | INHALATION_SOLUTION | Freq: Once | RESPIRATORY_TRACT | Status: AC
  Administered 2021-02-14: 2.5 mg via RESPIRATORY_TRACT
  Filled 2021-02-14: qty 3

## 2021-02-22 ENCOUNTER — Ambulatory Visit: Payer: PPO | Admitting: Pulmonary Disease

## 2021-02-28 ENCOUNTER — Ambulatory Visit (INDEPENDENT_AMBULATORY_CARE_PROVIDER_SITE_OTHER): Payer: PPO

## 2021-02-28 ENCOUNTER — Other Ambulatory Visit: Payer: Self-pay

## 2021-02-28 DIAGNOSIS — E538 Deficiency of other specified B group vitamins: Secondary | ICD-10-CM

## 2021-02-28 MED ORDER — CYANOCOBALAMIN 1000 MCG/ML IJ SOLN
1000.0000 ug | Freq: Once | INTRAMUSCULAR | Status: AC
  Administered 2021-02-28: 1000 ug via INTRAMUSCULAR

## 2021-02-28 NOTE — Progress Notes (Signed)
Patient presented for B 12 injection to right deltoid, patient voiced no concerns nor showed any signs of distress during injection. 

## 2021-03-16 ENCOUNTER — Other Ambulatory Visit: Payer: Self-pay

## 2021-03-16 ENCOUNTER — Encounter: Payer: Self-pay | Admitting: Pulmonary Disease

## 2021-03-16 ENCOUNTER — Ambulatory Visit: Payer: PPO | Admitting: Pulmonary Disease

## 2021-03-16 VITALS — BP 110/64 | HR 64 | Temp 98.2°F | Ht 62.0 in | Wt 134.2 lb

## 2021-03-16 DIAGNOSIS — K219 Gastro-esophageal reflux disease without esophagitis: Secondary | ICD-10-CM

## 2021-03-16 DIAGNOSIS — J45991 Cough variant asthma: Secondary | ICD-10-CM

## 2021-03-16 MED ORDER — BUDESONIDE-FORMOTEROL FUMARATE 160-4.5 MCG/ACT IN AERO: 2.0000 | INHALATION_SPRAY | Freq: Two times a day (BID) | RESPIRATORY_TRACT | 3 refills | Status: DC

## 2021-03-16 NOTE — Progress Notes (Signed)
Subjective:    Patient ID: Nancy Hamilton, female    DOB: 06-20-1946, 75 y.o.   MRN: [id number]  Chief Complaint  Patient presents with  . Follow-up    Asthma- breathing good, coughing.   HPI This is a 75 year old remote former smoker (quit 1993) who presents for follow-up on the issue of cough variant asthma with a component of LPR trigger.  She was last seen here 22 November 2020, no new issues in the interim.  This is a scheduled visit.  She had a Positive methacholine test May 2018. Remote smoking history spanned only 15 pack years.  No occupational exposures.  She has been more compliant with Symbicort twice a day.  She does need a new spacer for the inhaler.  And she takes her Symbicort twice a day her cough is well controlled.  Pliant with PPI and antireflux measures.  She had PFTs performed on 14 February 2021 and these were normal across the board.  She has not had any fevers, chills or sweats.  No upper respiratory infections of late.  Doing well with Symbicort.  Requests a new spacer.  Overall she feels well and looks well.    Review of Systems A 10 point review of systems was performed and it is as noted above otherwise negative.  Patient Active Problem List   Diagnosis Date Noted  . Thoracic aortic atherosclerosis ([place]) 12/21/2018  . Stress incontinence 03/22/2018  . Onychomycosis 03/22/2018  . Personal history of colonic polyps   . Benign neoplasm of descending colon   . Benign paroxysmal positional vertigo of right ear 11/14/2016  . Cervical disc disorder with radiculopathy of cervical region 03/09/2016  . Shoulder pain, right 03/09/2016  . Statin intolerance 06/08/2014  . B12 deficiency 06/07/2014  . Iron deficiency 06/07/2014  . Adenomatous polyps 02/28/2014  . Anemia 02/26/2014  . Visit for preventive health examination 02/20/2013  . Generalized anxiety disorder 12/01/2012  . GERD (gastroesophageal reflux disease) 12/01/2012  . Chronic cough 12/01/2012  .  Hyperlipidemia LDL goal <100 12/01/2012   Social History   Tobacco Use  . Smoking status: Former Smoker    Packs/day: 0.75    Years: 15.00    Pack years: 11.25    Types: Cigarettes    Quit date: 08/10/1992    Years since quitting: 28.6  . Smokeless tobacco: Never Used  Substance Use Topics  . Alcohol use: No   Allergies  Allergen Reactions  . Tylox [Oxycodone-Acetaminophen] Rash   Current Meds  Medication Sig  . albuterol (VENTOLIN HFA) 108 (90 Base) MCG/ACT inhaler Inhale 2 puffs into the lungs every 6 (six) hours as needed for wheezing or shortness of breath (cough).  . Ascorbic Acid (VITAMIN C) 1000 MG tablet Take 1,000 mg by mouth daily.  . benzonatate (TESSALON) 200 MG capsule Take 1 capsule (200 mg total) by mouth 3 (three) times daily.  . budesonide-formoterol (SYMBICORT) 160-4.5 MCG/ACT inhaler Inhale 2 puffs into the lungs 2 (two) times daily.  . Cholecalciferol 50 MCG (2000 UT) CAPS Take 1 capsule by mouth daily.  . cyanocobalamin (,VITAMIN B-12,) 1000 MCG/ML injection Inject 1 ml intrumuscularly daily x 3, then weekly x 4, then monthly thereafter  . docusate sodium (COLACE) 100 MG capsule Take 1 capsule (100 mg total) by mouth 2 (two) times daily.  . Ferrous Sulfate (IRON) 325 (65 Fe) MG TABS TAKE 1 TABLET BY MOUTH EVERY DAY WITH BREAKFAST  . FLUoxetine (PROZAC) 20 MG capsule TAKE 1 CAPSULE(20 MG) BY  MOUTH DAILY  . fluticasone (FLONASE) 50 MCG/ACT nasal spray SHAKE LIQUID AND USE 2 SPRAYS IN EACH NOSTRIL EVERY DAY  . levocetirizine (XYZAL) 5 MG tablet TAKE 1 TABLET(5 MG) BY MOUTH EVERY EVENING  . meclizine (ANTIVERT) 12.5 MG tablet Take 1 tablet (12.5 mg total) by mouth 3 (three) times daily as needed for dizziness.  . Misc Natural Products (OSTEO BI-FLEX JOINT SHIELD PO) Take 1 tablet by mouth daily.  . pantoprazole (PROTONIX) 40 MG tablet TAKE 1 TABLET(40 MG) BY MOUTH TWICE DAILY  . rosuvastatin (CRESTOR) 5 MG tablet TAKE 1 TABLET(5 MG) BY MOUTH DAILY  .  Spacer/Aero-Holding Chambers (AEROCHAMBER MV) inhaler Use as instructed  . Syringe, Disposable, 1 ML MISC For use with B12 injections   Immunization History  Administered Date(s) Administered  . Fluad Quad(high Dose 65+) 08/25/2019, 09/06/2020  . Influenza Split 08/10/2012  . Influenza, High Dose Seasonal PF 09/14/2016, 09/03/2017, 08/19/2018  . Influenza,inj,Quad PF,6+ Mos 08/20/2013, 08/26/2014, 09/06/2015  . Moderna Sars-Covid-2 Vaccination 12/22/2019, 01/19/2020, 09/26/2020  . Pneumococcal Conjugate-13 02/25/2014  . Pneumococcal Polysaccharide-23 02/18/2013, 06/22/2020  . Tdap 12/01/2010  . Zoster 10/20/2015  . Zoster Recombinat (Shingrix) 10/23/2019, 02/10/2020       Objective:   Physical Exam BP 110/64 (BP Location: Left Arm, Patient Position: Sitting, Cuff Size: Normal)   Pulse 64   Temp 98.2 F (36.8 C) (Temporal)   Ht 5\' 2"  (1.575 m)   Wt 134 lb 3.2 oz (60.9 kg)   SpO2 98%   BMI 24.55 kg/m   GENERAL:Well-developed well-nourished, fit appearing, fully ambulatory, no acute distress. HEAD: Normocephalic, atraumatic.  EYES: Pupils equal, round, reactive to light. No scleral icterus.  MOUTH:Nose/mouth/throat not examined due to masking requirements for COVID 19. NECK: Supple. No thyromegaly. Trachea midline. No JVD. No adenopathy. PULMONARY: Lungs clear to auscultation bilaterally.Good air entry bilaterally. CARDIOVASCULAR: S1 and S2. Regular rate and rhythm.No rubs, murmurs or gallops heard. GASTROINTESTINAL:No abdominal distention noted. MUSCULOSKELETAL: No joint deformity, no clubbing, no edema.  NEUROLOGIC:Awake, alert, fully oriented. No focal deficit noted. No gait disturbance noted. SKIN: Intact,warm,dry.On limited exam no rashes. PSYCH:Mood and behavior appropriate.      Assessment & Plan:     ICD-10-CM   1. Cough variant asthma  J45.991    Continue Symbicort 160/4.5, 2 inhalations twice a day Provided with a new spacer Follow-up in 6  months time call sooner should any new problems arise  2. Laryngopharyngeal reflux (LPR)  K21.9    Continue PPI Continue antireflux measures   Meds ordered this encounter  Medications  . budesonide-formoterol (SYMBICORT) 160-4.5 MCG/ACT inhaler    Sig: Inhale 2 puffs into the lungs 2 (two) times daily.    Dispense:  3 each    Refill:  3   C. Derrill Kay, MD [facility]   *This note was dictated using voice recognition software/Dragon.  Despite best efforts to proofread, errors can occur which can change the meaning.  Any change was purely unintentional.

## 2021-03-16 NOTE — Patient Instructions (Addendum)
Continue using Symbicort twice a day.  A new prescription was sent to your pharmacy.   We have given you a new spacer.  Follow instructions on cleaning as in the spacers instruction sheet.   We discussed your breathing tests today they were very good.   Will see him in follow-up in 6 months time call sooner should any new problems arise.

## 2021-03-24 ENCOUNTER — Encounter: Payer: Self-pay | Admitting: Pulmonary Disease

## 2021-03-30 DIAGNOSIS — H6123 Impacted cerumen, bilateral: Secondary | ICD-10-CM | POA: Diagnosis not present

## 2021-03-30 DIAGNOSIS — H902 Conductive hearing loss, unspecified: Secondary | ICD-10-CM | POA: Diagnosis not present

## 2021-04-04 ENCOUNTER — Ambulatory Visit (INDEPENDENT_AMBULATORY_CARE_PROVIDER_SITE_OTHER): Payer: PPO

## 2021-04-04 ENCOUNTER — Other Ambulatory Visit: Payer: Self-pay

## 2021-04-04 DIAGNOSIS — E538 Deficiency of other specified B group vitamins: Secondary | ICD-10-CM | POA: Diagnosis not present

## 2021-04-04 MED ORDER — CYANOCOBALAMIN 1000 MCG/ML IJ SOLN
1000.0000 ug | Freq: Once | INTRAMUSCULAR | Status: AC
  Administered 2021-04-04: 1000 ug via INTRAMUSCULAR

## 2021-04-04 NOTE — Progress Notes (Signed)
Patient presented for B 12 injection to left deltoid, patient voiced no concerns nor showed any signs of distress during injection. 

## 2021-04-26 ENCOUNTER — Other Ambulatory Visit: Payer: Self-pay | Admitting: Internal Medicine

## 2021-04-26 DIAGNOSIS — Z1231 Encounter for screening mammogram for malignant neoplasm of breast: Secondary | ICD-10-CM

## 2021-05-08 ENCOUNTER — Telehealth: Payer: Self-pay | Admitting: Internal Medicine

## 2021-05-08 NOTE — Telephone Encounter (Signed)
Pt does not have any symptoms she is just wanting to go visit her sister and would like to be tested before going. Pt has a nurse visit tomorrow and would like to have done then if okay.

## 2021-05-08 NOTE — Telephone Encounter (Signed)
PT called to advise that she is having her B12 tomorrow and was wondering if she could be tested for COVID. She has no symptoms of COVID but before she can be around her sister she needs to be tested. She would like to have it done at the same time.

## 2021-05-09 ENCOUNTER — Other Ambulatory Visit: Payer: Self-pay

## 2021-05-09 ENCOUNTER — Ambulatory Visit (INDEPENDENT_AMBULATORY_CARE_PROVIDER_SITE_OTHER): Payer: PPO

## 2021-05-09 DIAGNOSIS — E538 Deficiency of other specified B group vitamins: Secondary | ICD-10-CM

## 2021-05-09 DIAGNOSIS — Z1152 Encounter for screening for COVID-19: Secondary | ICD-10-CM | POA: Diagnosis not present

## 2021-05-09 LAB — POC COVID19 BINAXNOW: SARS Coronavirus 2 Ag: NEGATIVE

## 2021-05-09 MED ORDER — CYANOCOBALAMIN 1000 MCG/ML IJ SOLN
1000.0000 ug | Freq: Once | INTRAMUSCULAR | Status: AC
  Administered 2021-05-09: 1000 ug via INTRAMUSCULAR

## 2021-05-09 NOTE — Progress Notes (Signed)
Patient presented for POC COVID test and B 12 injection to left deltoid, patient voiced no concerns nor showed any signs of distress during injection.

## 2021-05-09 NOTE — Telephone Encounter (Signed)
Pt is aware.  

## 2021-05-29 DIAGNOSIS — D2261 Melanocytic nevi of right upper limb, including shoulder: Secondary | ICD-10-CM | POA: Diagnosis not present

## 2021-05-29 DIAGNOSIS — Z85828 Personal history of other malignant neoplasm of skin: Secondary | ICD-10-CM | POA: Diagnosis not present

## 2021-05-29 DIAGNOSIS — D2262 Melanocytic nevi of left upper limb, including shoulder: Secondary | ICD-10-CM | POA: Diagnosis not present

## 2021-05-29 DIAGNOSIS — L570 Actinic keratosis: Secondary | ICD-10-CM | POA: Diagnosis not present

## 2021-05-29 DIAGNOSIS — L821 Other seborrheic keratosis: Secondary | ICD-10-CM | POA: Diagnosis not present

## 2021-05-29 DIAGNOSIS — D225 Melanocytic nevi of trunk: Secondary | ICD-10-CM | POA: Diagnosis not present

## 2021-05-29 DIAGNOSIS — D2271 Melanocytic nevi of right lower limb, including hip: Secondary | ICD-10-CM | POA: Diagnosis not present

## 2021-05-29 DIAGNOSIS — D485 Neoplasm of uncertain behavior of skin: Secondary | ICD-10-CM | POA: Diagnosis not present

## 2021-05-29 DIAGNOSIS — D2272 Melanocytic nevi of left lower limb, including hip: Secondary | ICD-10-CM | POA: Diagnosis not present

## 2021-05-29 DIAGNOSIS — D045 Carcinoma in situ of skin of trunk: Secondary | ICD-10-CM | POA: Diagnosis not present

## 2021-05-29 DIAGNOSIS — X32XXXA Exposure to sunlight, initial encounter: Secondary | ICD-10-CM | POA: Diagnosis not present

## 2021-05-29 DIAGNOSIS — Z08 Encounter for follow-up examination after completed treatment for malignant neoplasm: Secondary | ICD-10-CM | POA: Diagnosis not present

## 2021-06-01 ENCOUNTER — Other Ambulatory Visit: Payer: Self-pay

## 2021-06-01 ENCOUNTER — Ambulatory Visit
Admission: RE | Admit: 2021-06-01 | Discharge: 2021-06-01 | Disposition: A | Discharge status: Home / Self Care | Payer: PPO | Source: Ambulatory Visit | Attending: Internal Medicine | Admitting: Internal Medicine

## 2021-06-01 DIAGNOSIS — Z1231 Encounter for screening mammogram for malignant neoplasm of breast: Secondary | ICD-10-CM | POA: Insufficient documentation

## 2021-06-13 ENCOUNTER — Ambulatory Visit (INDEPENDENT_AMBULATORY_CARE_PROVIDER_SITE_OTHER): Payer: PPO

## 2021-06-13 ENCOUNTER — Other Ambulatory Visit: Payer: Self-pay

## 2021-06-13 DIAGNOSIS — E538 Deficiency of other specified B group vitamins: Secondary | ICD-10-CM

## 2021-06-13 MED ORDER — CYANOCOBALAMIN 1000 MCG/ML IJ SOLN
1000.0000 ug | Freq: Once | INTRAMUSCULAR | Status: AC
  Administered 2021-06-13: 1000 ug via INTRAMUSCULAR

## 2021-06-13 NOTE — Progress Notes (Signed)
Patient presented for B 12 injection to left deltoid, patient voiced no concerns nor showed any signs of distress during injection. 

## 2021-06-14 ENCOUNTER — Other Ambulatory Visit: Payer: Self-pay | Admitting: Internal Medicine

## 2021-07-12 ENCOUNTER — Ambulatory Visit (INDEPENDENT_AMBULATORY_CARE_PROVIDER_SITE_OTHER): Payer: PPO | Admitting: Internal Medicine

## 2021-07-12 ENCOUNTER — Encounter: Payer: Self-pay | Admitting: Internal Medicine

## 2021-07-12 ENCOUNTER — Other Ambulatory Visit: Payer: Self-pay

## 2021-07-12 VITALS — BP 124/64 | HR 86 | Temp 96.8°F | Ht 62.0 in | Wt 130.0 lb

## 2021-07-12 DIAGNOSIS — R252 Cramp and spasm: Secondary | ICD-10-CM

## 2021-07-12 DIAGNOSIS — I700 Atherosclerosis of aorta: Secondary | ICD-10-CM

## 2021-07-12 DIAGNOSIS — D369 Benign neoplasm, unspecified site: Secondary | ICD-10-CM | POA: Diagnosis not present

## 2021-07-12 DIAGNOSIS — E559 Vitamin D deficiency, unspecified: Secondary | ICD-10-CM

## 2021-07-12 DIAGNOSIS — E538 Deficiency of other specified B group vitamins: Secondary | ICD-10-CM | POA: Diagnosis not present

## 2021-07-12 DIAGNOSIS — J4530 Mild persistent asthma, uncomplicated: Secondary | ICD-10-CM

## 2021-07-12 DIAGNOSIS — H6121 Impacted cerumen, right ear: Secondary | ICD-10-CM

## 2021-07-12 DIAGNOSIS — H6120 Impacted cerumen, unspecified ear: Secondary | ICD-10-CM | POA: Insufficient documentation

## 2021-07-12 DIAGNOSIS — E785 Hyperlipidemia, unspecified: Secondary | ICD-10-CM | POA: Diagnosis not present

## 2021-07-12 DIAGNOSIS — R0989 Other specified symptoms and signs involving the circulatory and respiratory systems: Secondary | ICD-10-CM | POA: Diagnosis not present

## 2021-07-12 LAB — COMPREHENSIVE METABOLIC PANEL
ALT: 11 U/L (ref 0–35)
AST: 14 U/L (ref 0–37)
Albumin: 4.1 g/dL (ref 3.5–5.2)
Alkaline Phosphatase: 71 U/L (ref 39–117)
BUN: 14 mg/dL (ref 6–23)
CO2: 28 mEq/L (ref 19–32)
Calcium: 9.3 mg/dL (ref 8.4–10.5)
Chloride: 104 mEq/L (ref 96–112)
Creatinine, Ser: 0.99 mg/dL (ref 0.40–1.20)
GFR: 56.29 mL/min — ABNORMAL LOW (ref >60.00)
Glucose, Bld: 95 mg/dL (ref 70–99)
Potassium: 4.1 mEq/L (ref 3.5–5.1)
Sodium: 141 mEq/L (ref 135–145)
Total Bilirubin: 0.5 mg/dL (ref 0.2–1.2)
Total Protein: 6.6 g/dL (ref 6.0–8.3)

## 2021-07-12 LAB — LIPID PANEL
Cholesterol: 173 mg/dL (ref 0–200)
HDL: 82.1 mg/dL (ref >39.00)
LDL Cholesterol: 79 mg/dL (ref 0–99)
NonHDL: 90.5
Total CHOL/HDL Ratio: 2
Triglycerides: 58 mg/dL (ref 0.0–149.0)
VLDL: 11.6 mg/dL (ref 0.0–40.0)

## 2021-07-12 LAB — VITAMIN D 25 HYDROXY (VIT D DEFICIENCY, FRACTURES): VITD: 56.52 ng/mL (ref 30.00–100.00)

## 2021-07-12 LAB — MAGNESIUM: Magnesium: 2.3 mg/dL (ref 1.5–2.5)

## 2021-07-12 LAB — TSH: TSH: 2.55 u[IU]/mL (ref 0.35–5.50)

## 2021-07-12 MED ORDER — TETANUS-DIPHTH-ACELL PERTUSSIS 5-2.5-18.5 LF-MCG/0.5 IM SUSY: 0.5000 mL | PREFILLED_SYRINGE | Freq: Once | INTRAMUSCULAR | 0 refills | Status: AC

## 2021-07-12 NOTE — Patient Instructions (Addendum)
You  need  your tetanus-diptheria-pertussis vaccine (TDaP) but you can get it for less $$$ at a local pharmacy with the script I have provided you.   You  need to use the Symbicort every 12 hours to control your asthma

## 2021-07-12 NOTE — Progress Notes (Signed)
Patient ID: Nancy Hamilton, female    DOB: 04/11/1946  Age: 75 y.o. MRN: [id number]  The patient is here for for follow up and  management of other chronic and acute problems.  This visit occurred during the SARS-CoV-2 public health emergency.  Safety protocols were in place, including screening questions prior to the visit, additional usage of staff PPE, and extensive cleaning of exam room while observing appropriate contact time as indicated for disinfecting solutions.     The risk factors are reflected in the social history.  The roster of all physicians providing medical care to patient - is listed in the Snapshot section of the chart.  Activities of daily living:  The patient is 100% independent in all ADLs: dressing, toileting, feeding as well as independent mobility  Home safety : The patient has smoke detectors in the home. They wear seatbelts.  There are no firearms at home. There is no violence in the home.   There is no risks for hepatitis, STDs or HIV. There is no   history of blood transfusion. They have no travel history to infectious disease endemic areas of the world.  The patient has seen their dentist in the last six month. They have seen their eye doctor in the last year. She denies hearing difficulty with regard to whispered voices and some television programs.  They have deferred audiologic testing in the last year.  They do not  have excessive sun exposure. Discussed the need for sun protection: hats, long sleeves and use of sunscreen if there is significant sun exposure.   Diet: the importance of a healthy diet is discussed. They do have a healthy diet.  The benefits of regular aerobic exercise were discussed. She walks 4 times per week ,  60 minutes.   Depression screen: there are no signs or vegative symptoms of depression- irritability, change in appetite, anhedonia, sadness/tearfullness.  Cognitive assessment: the patient manages all their financial and personal  affairs and is actively engaged. They could relate day,date,year and events; recalled 2/3 objects at 3 minutes; performed clock-face test normally.  The following portions of the patient's history were reviewed and updated as appropriate: allergies, current medications, past family history, past medical history,  past surgical history, past social history  and problem list.  Visual acuity was not assessed per patient preference since she has regular follow up with her ophthalmologist. Hearing and body mass index were assessed and reviewed.   During the course of the visit the patient was educated and counseled about appropriate screening and preventive services including : fall prevention , diabetes screening, nutrition counseling, colorectal cancer screening, and recommended immunizations.    CC: The primary encounter diagnosis was Hyperlipidemia LDL goal <100. Diagnoses of Impacted cerumen of right ear and B12 deficiency were also pertinent to this visit.  History Nancy Hamilton has a past medical history of Allergy, Anemia, Anxiety, Arthritis, Cancer ([place]), Cough, Depression, GERD (gastroesophageal reflux disease), and Hyperlipidemia.   She has a past surgical history that includes Back surgery (1980); Cataract extraction w/PHACO (Right, 11/28/2015); Colonoscopy; Cataract extraction w/PHACO (Left, 12/19/2015); Colonoscopy with propofol (N/A, 10/10/2017); and polypectomy (10/10/2017).   Her family history includes Breast cancer (age of onset: 32) in her maternal aunt; Diabetes in her brother and paternal grandmother; Fibromyalgia in her sister; Heart disease in her brother; Hyperlipidemia in her sister; Hypertension in her brother.She reports that she quit smoking about 28 years ago. Her smoking use included cigarettes. She has a 11.25 pack-year smoking history. She  has never used smokeless tobacco. She reports that she does not drink alcohol and does not use drugs.  Outpatient Medications Prior to Visit   Medication Sig Dispense Refill   albuterol (VENTOLIN HFA) 108 (90 Base) MCG/ACT inhaler Inhale 2 puffs into the lungs every 6 (six) hours as needed for wheezing or shortness of breath (cough). 18 g 2   Ascorbic Acid (VITAMIN C) 1000 MG tablet Take 1,000 mg by mouth daily.     budesonide-formoterol (SYMBICORT) 160-4.5 MCG/ACT inhaler Inhale 2 puffs into the lungs 2 (two) times daily. 3 each 3   Cholecalciferol 50 MCG (2000 UT) CAPS Take 1 capsule by mouth daily.     Coenzyme Q10 (CO Q 10) 100 MG CAPS Take 1 capsule by mouth daily.     cyanocobalamin (,VITAMIN B-12,) 1000 MCG/ML injection Inject 1 ml intrumuscularly daily x 3, then weekly x 4, then monthly thereafter 10 mL 2   docusate sodium (COLACE) 100 MG capsule Take 1 capsule (100 mg total) by mouth 2 (two) times daily. 180 capsule 1   Ferrous Sulfate (IRON) 325 (65 Fe) MG TABS TAKE 1 TABLET BY MOUTH EVERY DAY WITH BREAKFAST 90 tablet 0   FLUoxetine (PROZAC) 20 MG capsule TAKE 1 CAPSULE(20 MG) BY MOUTH DAILY 90 capsule 1   fluticasone (FLONASE) 50 MCG/ACT nasal spray SHAKE LIQUID AND USE 2 SPRAYS IN EACH NOSTRIL EVERY DAY 48 g 1   Lactobacillus (PROBIOTIC ACIDOPHILUS PO) Take 1 capsule by mouth daily.     levocetirizine (XYZAL) 5 MG tablet TAKE 1 TABLET(5 MG) BY MOUTH EVERY EVENING 90 tablet 1   meclizine (ANTIVERT) 12.5 MG tablet Take 1 tablet (12.5 mg total) by mouth 3 (three) times daily as needed for dizziness. 30 tablet 0   Misc Natural Products (OSTEO BI-FLEX JOINT SHIELD PO) Take 1 tablet by mouth daily.     pantoprazole (PROTONIX) 40 MG tablet TAKE 1 TABLET(40 MG) BY MOUTH TWICE DAILY 180 tablet 1   rosuvastatin (CRESTOR) 5 MG tablet TAKE 1 TABLET(5 MG) BY MOUTH DAILY 90 tablet 3   Spacer/Aero-Holding Chambers (AEROCHAMBER MV) inhaler Use as instructed 1 each 0   Syringe, Disposable, 1 ML MISC For use with B12 injections 25 each 1   benzonatate (TESSALON) 200 MG capsule Take 1 capsule (200 mg total) by mouth 3 (three) times daily.  (Patient not taking: Reported on 07/12/2021) 90 capsule 3   No facility-administered medications prior to visit.    Review of Systems  Patient denies headache, fevers, malaise, unintentional weight loss, skin rash, eye pain, sinus congestion and sinus pain, sore throat, dysphagia,  hemoptysis , cough, dyspnea, wheezing, chest pain, palpitations, orthopnea, edema, abdominal pain, nausea, melena, diarrhea, constipation, flank pain, dysuria, hematuria, urinary  Frequency, nocturia, numbness, tingling, seizures,  Focal weakness, Loss of consciousness,  Tremor, insomnia, depression, anxiety, and suicidal ideation.     Objective:  BP 124/64 (BP Location: Left Arm, Patient Position: Sitting, Cuff Size: Normal)   Pulse 86   Temp (!) 96.8 F (36 C) (Temporal)   Ht '5\' 2"'$  (1.575 m)   Wt 130 lb (59 kg)   SpO2 98%   BMI 23.78 kg/m   Physical Exam  General appearance: alert, cooperative and appears stated age Head: Normocephalic, without obvious abnormality, atraumatic Eyes: conjunctivae/corneas clear. PERRL, EOM's intact. Fundi benign. Ears: normal TM's and external ear canals both ears Nose: Nares normal. Septum midline. Mucosa normal. No drainage or sinus tenderness. Throat: lips, mucosa, and tongue normal; teeth and gums normal Neck:  no adenopathy, no carotid bruit, no JVD, supple, symmetrical, trachea midline and thyroid not enlarged, symmetric, no tenderness/mass/nodules Lungs: clear to auscultation bilaterally Breasts: normal appearance, no masses or tenderness Heart: regular rate and rhythm, S1, S2 normal, no murmur, click, rub or gallop Abdomen: soft, non-tender; bowel sounds normal; no masses,  no organomegaly Extremities: extremities normal, atraumatic, no cyanosis or edema Pulses: 2+ and symmetric Skin: Skin color, texture, turgor normal. No rashes or lesions Neurologic: Alert and oriented X 3, normal strength and tone. Normal symmetric reflexes. Normal coordination and gait.     Assessment & Plan:   Problem List Items Addressed This Visit       Unprioritized   Hyperlipidemia LDL goal <100 - Primary   B12 deficiency   Cerumen impaction    Right ear   Cleaned by [facility]       Medications Discontinued During This Encounter  Medication Reason   benzonatate (TESSALON) 200 MG capsule     Follow-up: No follow-ups on file.   Crecencio Mc, MD

## 2021-07-12 NOTE — Assessment & Plan Note (Addendum)
Right ear   Cleaned by [facility] (ENT) in the past.  Will return here for scheduled cleaning during RN viisit

## 2021-07-15 ENCOUNTER — Other Ambulatory Visit: Payer: Self-pay | Admitting: Internal Medicine

## 2021-07-15 DIAGNOSIS — R944 Abnormal results of kidney function studies: Secondary | ICD-10-CM

## 2021-07-15 NOTE — Assessment & Plan Note (Signed)
Noted on chest x ray. And reviewed with patient today.  She is tolerating rosuvastatin as preventive ; LDL now < 100  Lab Results  Component Value Date   CHOL 173 07/12/2021   HDL 82.10 07/12/2021   LDLCALC 79 07/12/2021   TRIG 58.0 07/12/2021   CHOLHDL 2 07/12/2021

## 2021-07-15 NOTE — Assessment & Plan Note (Signed)
LDL and triglycerides are at goal on current medications. sHe has no side effects and liver enzymes are normal. No changes today  Lab Results  Component Value Date   CHOL 173 07/12/2021   HDL 82.10 07/12/2021   LDLCALC 79 07/12/2021   TRIG 58.0 07/12/2021   CHOLHDL 2 07/12/2021   Lab Results  Component Value Date   ALT 11 07/12/2021   AST 14 07/12/2021   ALKPHOS 71 07/12/2021   BILITOT 0.5 07/12/2021

## 2021-07-15 NOTE — Assessment & Plan Note (Signed)
Found on 2018 colonoscopy by DW.  Follow up in 2023

## 2021-07-15 NOTE — Assessment & Plan Note (Addendum)
Managed with monthly parenteral supplementation since diagnosis in 2015.  IF antibody needed.   Lab Results  Component Value Date   [id number] 06/16/2019

## 2021-07-15 NOTE — Assessment & Plan Note (Addendum)
Reviewed workup and goals of treatment .  She  is  Under  Utilizing  her maintenance inhaler (symbicort).

## 2021-07-18 ENCOUNTER — Ambulatory Visit (INDEPENDENT_AMBULATORY_CARE_PROVIDER_SITE_OTHER): Payer: PPO

## 2021-07-18 ENCOUNTER — Telehealth: Payer: Self-pay

## 2021-07-18 ENCOUNTER — Other Ambulatory Visit: Payer: Self-pay

## 2021-07-18 DIAGNOSIS — E538 Deficiency of other specified B group vitamins: Secondary | ICD-10-CM | POA: Diagnosis not present

## 2021-07-18 DIAGNOSIS — H6121 Impacted cerumen, right ear: Secondary | ICD-10-CM

## 2021-07-18 MED ORDER — CYANOCOBALAMIN 1000 MCG/ML IJ SOLN
1000.0000 ug | Freq: Once | INTRAMUSCULAR | Status: AC
  Administered 2021-07-18: 1000 ug via INTRAMUSCULAR

## 2021-07-18 NOTE — Telephone Encounter (Signed)
Pt ear was assessed by Clinical Lead and pt was informed to come back the evening of 07/18/21 before 3pm for an Ear Lavage as we wait for Provider orders

## 2021-07-18 NOTE — Telephone Encounter (Signed)
Documentation for patient to return on Sept 6 for RN visit and right ear lavage  donne .  Thank you!

## 2021-07-18 NOTE — Telephone Encounter (Signed)
Noted, patient has arrived for the ear lavage and the right ear has been cleaned. Please see Nurse Visit Encounter.

## 2021-07-18 NOTE — Progress Notes (Signed)
Patient presented for B 12 injection to left deltoid, patient voiced no concerns nor showed any signs of distress during injection   Pt returned for a Ear Lavage to the Right Ear. Right ear was blocked with wax upon arrival. Pt ear was cleaned at 2:45pm. Pt states that she could hear better and voiced no concerns, discomfort, or dizziness at time of cleaning. For Orders, see telephone note from Dr. Derrel Nip on 07/18/21.

## 2021-07-18 NOTE — Assessment & Plan Note (Signed)
Patient to return on Sept 6 for RN visit and right ear lavage.

## 2021-07-18 NOTE — Telephone Encounter (Signed)
Patient is coming in for a b12 nurse visit and an Ear Lavage. Do not see orders for an Ear Lavage in patients chart. Pt states that she was instructed to schedule for one verbally. [place] for patient to have an Theatre manager with todays nurse visit?

## 2021-07-19 DIAGNOSIS — D045 Carcinoma in situ of skin of trunk: Secondary | ICD-10-CM | POA: Diagnosis not present

## 2021-08-07 ENCOUNTER — Other Ambulatory Visit: Payer: Self-pay

## 2021-08-07 MED ORDER — PANTOPRAZOLE SODIUM 40 MG PO TBEC: DELAYED_RELEASE_TABLET | ORAL | 1 refills | Status: DC

## 2021-08-07 MED ORDER — FLUOXETINE HCL 20 MG PO CAPS: 20.0000 mg | ORAL_CAPSULE | Freq: Every day | ORAL | 1 refills | Status: DC

## 2021-08-07 MED ORDER — LEVOCETIRIZINE DIHYDROCHLORIDE 5 MG PO TABS: ORAL_TABLET | ORAL | 1 refills | Status: DC

## 2021-08-07 MED ORDER — FLUTICASONE PROPIONATE 50 MCG/ACT NA SUSP: 2.0000 | Freq: Every day | NASAL | 1 refills | Status: DC

## 2021-08-07 MED ORDER — ROSUVASTATIN CALCIUM 5 MG PO TABS: ORAL_TABLET | ORAL | 3 refills | Status: DC

## 2021-08-10 ENCOUNTER — Telehealth: Payer: Self-pay | Admitting: Internal Medicine

## 2021-08-10 MED ORDER — LEVOCETIRIZINE DIHYDROCHLORIDE 5 MG PO TABS: ORAL_TABLET | ORAL | 1 refills | Status: DC

## 2021-08-10 MED ORDER — FLUTICASONE PROPIONATE 50 MCG/ACT NA SUSP: 2.0000 | Freq: Every day | NASAL | 1 refills | Status: DC

## 2021-08-10 MED ORDER — PANTOPRAZOLE SODIUM 40 MG PO TBEC: DELAYED_RELEASE_TABLET | ORAL | 1 refills | Status: DC

## 2021-08-10 NOTE — Telephone Encounter (Signed)
Patient switched from [facility] to [facility] in [place] but they do not have her prescriptions because she has 3 month supplies on her medicines.The medicines are fluticasone (FLONASE) 50 MCG/ACT nasal spray,pantoprazole (PROTONIX) 40 MG tablet and levocetirizine (XYZAL) 5 MG tablet she is currently out of the levocetirizine (XYZAL) 5 MG tablet.Please call her with an update on her medicines.

## 2021-08-10 NOTE — Telephone Encounter (Signed)
Medication have sent to [facility]. Pt is aware.

## 2021-08-22 ENCOUNTER — Other Ambulatory Visit: Payer: Self-pay

## 2021-08-22 ENCOUNTER — Ambulatory Visit (INDEPENDENT_AMBULATORY_CARE_PROVIDER_SITE_OTHER): Payer: PPO

## 2021-08-22 DIAGNOSIS — R944 Abnormal results of kidney function studies: Secondary | ICD-10-CM | POA: Diagnosis not present

## 2021-08-22 DIAGNOSIS — Z23 Encounter for immunization: Secondary | ICD-10-CM

## 2021-08-22 DIAGNOSIS — E538 Deficiency of other specified B group vitamins: Secondary | ICD-10-CM | POA: Diagnosis not present

## 2021-08-22 LAB — RENAL FUNCTION PANEL
Albumin: 4.2 g/dL (ref 3.5–5.2)
BUN: 11 mg/dL (ref 6–23)
CO2: 32 mEq/L (ref 19–32)
Calcium: 9.3 mg/dL (ref 8.4–10.5)
Chloride: 102 mEq/L (ref 96–112)
Creatinine, Ser: 0.88 mg/dL (ref 0.40–1.20)
GFR: 64.79 mL/min (ref >60.00)
Glucose, Bld: 97 mg/dL (ref 70–99)
Phosphorus: 4.7 mg/dL — ABNORMAL HIGH (ref 2.3–4.6)
Potassium: 4.3 mEq/L (ref 3.5–5.1)
Sodium: 141 mEq/L (ref 135–145)

## 2021-08-22 MED ORDER — CYANOCOBALAMIN 1000 MCG/ML IJ SOLN
1000.0000 ug | Freq: Once | INTRAMUSCULAR | Status: AC
  Administered 2021-08-22: 1000 ug via INTRAMUSCULAR

## 2021-08-22 NOTE — Progress Notes (Signed)
Patient presented for B 12 injection to left deltoid, patient voiced no concerns nor showed any signs of distress during injection. 

## 2021-08-24 LAB — INTRINSIC FACTOR ANTIBODIES: Intrinsic Factor: POSITIVE — AB

## 2021-08-30 ENCOUNTER — Ambulatory Visit (INDEPENDENT_AMBULATORY_CARE_PROVIDER_SITE_OTHER): Payer: PPO | Admitting: Internal Medicine

## 2021-08-30 ENCOUNTER — Other Ambulatory Visit: Payer: Self-pay

## 2021-08-30 ENCOUNTER — Encounter: Payer: Self-pay | Admitting: Internal Medicine

## 2021-08-30 DIAGNOSIS — K219 Gastro-esophageal reflux disease without esophagitis: Secondary | ICD-10-CM

## 2021-08-30 NOTE — Patient Instructions (Addendum)
Do not recline within 2 hours of eating .  (This will improve reflux symptoms especially if you have a hiatal hernia)   Continue taking protonix twice daily (on en empty stomach)  Avoid carbonated drinks    PLEASE consider getting a "wedge " for your bed (and couch) to elevate your torso

## 2021-08-30 NOTE — Progress Notes (Signed)
Subjective:  Patient ID: Nancy Hamilton, female    DOB: 23-Apr-1946  Age: 75 y.o. MRN: [id number]  CC: The encounter diagnosis was Gastroesophageal reflux disease without esophagitis.  HPI Nancy Hamilton presents for  Chief Complaint  Patient presents with   Acute Visit    Stomach issues   This visit occurred during the SARS-CoV-2 public health emergency.  Safety protocols were in place, including screening questions prior to the visit, additional usage of staff PPE, and extensive cleaning of exam room while observing appropriate contact time as indicated for disinfecting solutions.   75 yr old female with history of aortic atherosclerosis, mild asthma ,  hiatal hernia by 2014 EGD,  and GERD presents with GI symptoms of gnawing epigastric pain , increased SSCP and excessive belching.    Has been eating 1-2 peaches daily for a month but stopped a week ago,  and symptoms have dramatically improved. Has been  taking PPI   bid.  Stools have been brown.  There has been no unintentional weight loss.  She is physically active and has no chest pain or dyspnea with exertion.      Outpatient Medications Prior to Visit  Medication Sig Dispense Refill   albuterol (VENTOLIN HFA) 108 (90 Base) MCG/ACT inhaler Inhale 2 puffs into the lungs every 6 (six) hours as needed for wheezing or shortness of breath (cough). 18 g 2   Ascorbic Acid (VITAMIN C) 1000 MG tablet Take 1,000 mg by mouth daily.     budesonide-formoterol (SYMBICORT) 160-4.5 MCG/ACT inhaler Inhale 2 puffs into the lungs 2 (two) times daily. 3 each 3   Cholecalciferol 50 MCG (2000 UT) CAPS Take 1 capsule by mouth daily.     Coenzyme Q10 (CO Q 10) 100 MG CAPS Take 1 capsule by mouth daily.     cyanocobalamin (,VITAMIN B-12,) 1000 MCG/ML injection Inject 1 ml intrumuscularly daily x 3, then weekly x 4, then monthly thereafter 10 mL 2   docusate sodium (COLACE) 100 MG capsule Take 1 capsule (100 mg total) by mouth 2 (two) times daily. 180  capsule 1   Ferrous Sulfate (IRON) 325 (65 Fe) MG TABS TAKE 1 TABLET BY MOUTH EVERY DAY WITH BREAKFAST 90 tablet 0   FLUoxetine (PROZAC) 20 MG capsule Take 1 capsule (20 mg total) by mouth daily. 90 capsule 1   fluticasone (FLONASE) 50 MCG/ACT nasal spray Place 2 sprays into both nostrils daily. 48 g 1   Lactobacillus (PROBIOTIC ACIDOPHILUS PO) Take 1 capsule by mouth daily.     levocetirizine (XYZAL) 5 MG tablet TAKE 1 TABLET(5 MG) BY MOUTH EVERY EVENING 90 tablet 1   meclizine (ANTIVERT) 12.5 MG tablet Take 1 tablet (12.5 mg total) by mouth 3 (three) times daily as needed for dizziness. 30 tablet 0   Misc Natural Products (OSTEO BI-FLEX JOINT SHIELD PO) Take 1 tablet by mouth daily.     pantoprazole (PROTONIX) 40 MG tablet TAKE 1 TABLET(40 MG) BY MOUTH TWICE DAILY 180 tablet 1   rosuvastatin (CRESTOR) 5 MG tablet TAKE 1 TABLET(5 MG) BY MOUTH DAILY 90 tablet 3   Spacer/Aero-Holding Chambers (AEROCHAMBER MV) inhaler Use as instructed 1 each 0   Syringe, Disposable, 1 ML MISC For use with B12 injections 25 each 1   No facility-administered medications prior to visit.    Review of Systems;  Patient denies headache, fevers, malaise, unintentional weight loss, skin rash, eye pain, sinus congestion and sinus pain, sore throat, dysphagia,  hemoptysis , cough, dyspnea, wheezing,  chest pain, palpitations, orthopnea, edema, abdominal pain, nausea, melena, diarrhea, constipation, flank pain, dysuria, hematuria, urinary  Frequency, nocturia, numbness, tingling, seizures,  Focal weakness, Loss of consciousness,  Tremor, insomnia, depression, anxiety, and suicidal ideation.      Objective:  BP 126/72 (BP Location: Left Arm, Patient Position: Sitting, Cuff Size: Normal)   Pulse (!) 58   Temp (!) 96.6 F (35.9 C) (Temporal)   Ht 5\' 2"  (1.575 m)   Wt 136 lb 1.9 oz (61.7 kg)   SpO2 98%   BMI 24.90 kg/m   BP Readings from Last 3 Encounters:  08/30/21 126/72  07/12/21 124/64  03/16/21 110/64     Wt Readings from Last 3 Encounters:  08/30/21 136 lb 1.9 oz (61.7 kg)  07/12/21 130 lb (59 kg)  03/16/21 134 lb 3.2 oz (60.9 kg)    General appearance: alert, cooperative and appears stated age Ears: normal TM's and external ear canals both ears Throat: lips, mucosa, and tongue normal; teeth and gums normal Neck: no adenopathy, no carotid bruit, supple, symmetrical, trachea midline and thyroid not enlarged, symmetric, no tenderness/mass/nodules Back: symmetric, no curvature. ROM normal. No CVA tenderness. Lungs: clear to auscultation bilaterally Heart: regular rate and rhythm, S1, S2 normal, no murmur, click, rub or gallop Abdomen: soft, non-tender; bowel sounds normal; no masses,  no organomegaly Pulses: 2+ and symmetric Skin: Skin color, texture, turgor normal. No rashes or lesions Lymph nodes: Cervical, supraclavicular, and axillary nodes normal.  No results found for: HGBA1C  Lab Results  Component Value Date   CREATININE 0.88 08/22/2021   CREATININE 0.99 07/12/2021   CREATININE 0.87 06/22/2020    Lab Results  Component Value Date   WBC 6.2 06/22/2020   HGB 12.7 06/22/2020   HCT 37.8 06/22/2020   PLT 179.0 06/22/2020   GLUCOSE 97 08/22/2021   CHOL 173 07/12/2021   TRIG 58.0 07/12/2021   HDL 82.10 07/12/2021   LDLCALC 79 07/12/2021   ALT 11 07/12/2021   AST 14 07/12/2021   NA 141 08/22/2021   K 4.3 08/22/2021   CL 102 08/22/2021   CREATININE 0.88 08/22/2021   BUN 11 08/22/2021   CO2 32 08/22/2021   TSH 2.55 07/12/2021    MM 3D SCREEN BREAST BILATERAL  Result Date: 06/02/2021 CLINICAL DATA:  Screening. EXAM: DIGITAL SCREENING BILATERAL MAMMOGRAM WITH TOMOSYNTHESIS AND CAD TECHNIQUE: Bilateral screening digital craniocaudal and mediolateral oblique mammograms were obtained. Bilateral screening digital breast tomosynthesis was performed. The images were evaluated with computer-aided detection. COMPARISON:  Previous exam(s). ACR Breast Density Category c: The  breast tissue is heterogeneously dense, which may obscure small masses. FINDINGS: There are no findings suspicious for malignancy. IMPRESSION: No mammographic evidence of malignancy. A result letter of this screening mammogram will be mailed directly to the patient. RECOMMENDATION: Screening mammogram in one year. (Code:SM-B-01Y) BI-RADS CATEGORY  1: Negative. Electronically Signed   By: Lajean Manes M.D.   On: 06/02/2021 12:39   Assessment & Plan:   Problem List Items Addressed This Visit     GERD (gastroesophageal reflux disease)    She notes an improvement in symptoms with less fruit intake and  is takingher PPI twicee daily.  Reviewed principles of GERD management, which she is not following.  Advised to use a wedge pillow at nigh , avoiding foods and behaviors that trigger her reflux symptoms, and avoid overeating and eating within 2 hours of reclining .  She denies any recent episodes of chest pain since stopping the peach eating  I am having Nancy Hamilton maintain her Cholecalciferol, Misc Natural Products (OSTEO BI-FLEX JOINT SHIELD PO), cyanocobalamin, Syringe (Disposable), meclizine, vitamin C, docusate sodium, AeroChamber MV, albuterol, Iron, budesonide-formoterol, Co Q 10, Lactobacillus (PROBIOTIC ACIDOPHILUS PO), FLUoxetine, rosuvastatin, fluticasone, levocetirizine, and pantoprazole.  No orders of the defined types were placed in this encounter.   There are no discontinued medications.  Follow-up: No follow-ups on file.   Crecencio Mc, MD

## 2021-09-02 NOTE — Assessment & Plan Note (Signed)
She notes an improvement in symptoms with less fruit intake and  is takingher PPI twicee daily.  Reviewed principles of GERD management, which she is not following.  Advised to use a wedge pillow at nigh , avoiding foods and behaviors that trigger her reflux symptoms, and avoid overeating and eating within 2 hours of reclining .  She denies any recent episodes of chest pain since stopping the peach eating

## 2021-09-26 ENCOUNTER — Other Ambulatory Visit: Payer: Self-pay

## 2021-09-26 ENCOUNTER — Ambulatory Visit (INDEPENDENT_AMBULATORY_CARE_PROVIDER_SITE_OTHER): Payer: PPO

## 2021-09-26 DIAGNOSIS — E538 Deficiency of other specified B group vitamins: Secondary | ICD-10-CM | POA: Diagnosis not present

## 2021-09-26 MED ORDER — CYANOCOBALAMIN 1000 MCG/ML IJ SOLN
1000.0000 ug | Freq: Once | INTRAMUSCULAR | Status: AC
  Administered 2021-09-26: 1000 ug via INTRAMUSCULAR

## 2021-09-26 NOTE — Progress Notes (Signed)
Patient presented for B 12 injection to right deltoid, patient voiced no concerns nor showed any signs of distress during injection. 

## 2021-10-25 DIAGNOSIS — D3132 Benign neoplasm of left choroid: Secondary | ICD-10-CM | POA: Diagnosis not present

## 2021-10-31 ENCOUNTER — Ambulatory Visit (INDEPENDENT_AMBULATORY_CARE_PROVIDER_SITE_OTHER): Payer: PPO

## 2021-10-31 ENCOUNTER — Other Ambulatory Visit: Payer: Self-pay

## 2021-10-31 DIAGNOSIS — E538 Deficiency of other specified B group vitamins: Secondary | ICD-10-CM | POA: Diagnosis not present

## 2021-10-31 MED ORDER — CYANOCOBALAMIN 1000 MCG/ML IJ SOLN
1000.0000 ug | Freq: Once | INTRAMUSCULAR | Status: AC
  Administered 2021-10-31: 11:00:00 1000 ug via INTRAMUSCULAR

## 2021-10-31 NOTE — Progress Notes (Signed)
Patient presented for B 12 injection to left deltoid, patient voiced no concerns nor showed any signs of distress during injection. 

## 2021-11-13 DIAGNOSIS — L239 Allergic contact dermatitis, unspecified cause: Secondary | ICD-10-CM | POA: Diagnosis not present

## 2021-11-16 ENCOUNTER — Ambulatory Visit: Payer: PPO | Admitting: Pulmonary Disease

## 2021-12-05 ENCOUNTER — Other Ambulatory Visit: Payer: Self-pay

## 2021-12-05 ENCOUNTER — Ambulatory Visit (INDEPENDENT_AMBULATORY_CARE_PROVIDER_SITE_OTHER): Payer: PPO

## 2021-12-05 DIAGNOSIS — E538 Deficiency of other specified B group vitamins: Secondary | ICD-10-CM

## 2021-12-05 MED ORDER — CYANOCOBALAMIN 1000 MCG/ML IJ SOLN
1000.0000 ug | Freq: Once | INTRAMUSCULAR | Status: AC
Start: 2021-12-05 — End: 2021-12-05
  Administered 2021-12-05: 16:00:00 1000 ug via INTRAMUSCULAR

## 2021-12-05 NOTE — Progress Notes (Signed)
Nancy Hamilton presents today for injection per MD orders. B12 injection administered IM in right Upper Arm. Administration without incident. Patient tolerated well. Mouhamed Glassco,cma

## 2021-12-07 ENCOUNTER — Ambulatory Visit (INDEPENDENT_AMBULATORY_CARE_PROVIDER_SITE_OTHER): Payer: PPO

## 2021-12-07 VITALS — Ht 62.0 in | Wt 136.0 lb

## 2021-12-07 DIAGNOSIS — Z0000 Encounter for general adult medical examination without abnormal findings: Secondary | ICD-10-CM | POA: Diagnosis not present

## 2021-12-07 NOTE — Progress Notes (Addendum)
Subjective:   Nancy Hamilton is a 76 y.o. female who presents for Medicare Annual (Subsequent) preventive examination.  Review of Systems    No ROS.  Medicare Wellness Virtual Visit.  Visual/audio telehealth visit, UTA vital signs.   See social history for additional risk factors.   Cardiac Risk Factors include: advanced age (>20men, >36 women)     Objective:    Today's Vitals   12/07/21 0949  Weight: 136 lb (61.7 kg)  Height: 5\' 2"  (1.575 m)   Body mass index is 24.87 kg/m.  Advanced Directives 12/07/2021 12/06/2020 12/04/2019 06/26/2018 10/10/2017 06/25/2017 12/19/2015  Does Patient Have a Medical Advance Directive? Yes Yes Yes Yes Yes No No  Type of Paramedic of [place];Living will [place];Living will [place];Living will [place];Living will [place];Living will - -  Does patient want to make changes to medical advance directive? No - Patient declined No - Patient declined No - Patient declined No - Patient declined - Yes (MAU/Ambulatory/Procedural Areas - Information given) -  Copy of [place] in Chart? Yes - validated most recent copy scanned in chart (See row information) No - copy requested Yes - validated most recent copy scanned in chart (See row information) No - copy requested No - copy requested - -  Would patient like information on creating a medical advance directive? - - - - - - -   Current Medications (verified) Outpatient Encounter Medications as of 12/07/2021  Medication Sig   albuterol (VENTOLIN HFA) 108 (90 Base) MCG/ACT inhaler Inhale 2 puffs into the lungs every 6 (six) hours as needed for wheezing or shortness of breath (cough).   Ascorbic Acid (VITAMIN C) 1000 MG tablet Take 1,000 mg by mouth daily.   budesonide-formoterol (SYMBICORT) 160-4.5 MCG/ACT inhaler Inhale 2 puffs into the lungs 2 (two) times daily.   Cholecalciferol 50 MCG  (2000 UT) CAPS Take 1 capsule by mouth daily.   Coenzyme Q10 (CO Q 10) 100 MG CAPS Take 1 capsule by mouth daily.   cyanocobalamin (,VITAMIN B-12,) 1000 MCG/ML injection Inject 1 ml intrumuscularly daily x 3, then weekly x 4, then monthly thereafter   docusate sodium (COLACE) 100 MG capsule Take 1 capsule (100 mg total) by mouth 2 (two) times daily.   Ferrous Sulfate (IRON) 325 (65 Fe) MG TABS TAKE 1 TABLET BY MOUTH EVERY DAY WITH BREAKFAST   FLUoxetine (PROZAC) 20 MG capsule Take 1 capsule (20 mg total) by mouth daily.   fluticasone (FLONASE) 50 MCG/ACT nasal spray Place 2 sprays into both nostrils daily.   Lactobacillus (PROBIOTIC ACIDOPHILUS PO) Take 1 capsule by mouth daily.   levocetirizine (XYZAL) 5 MG tablet TAKE 1 TABLET(5 MG) BY MOUTH EVERY EVENING   meclizine (ANTIVERT) 12.5 MG tablet Take 1 tablet (12.5 mg total) by mouth 3 (three) times daily as needed for dizziness.   Misc Natural Products (OSTEO BI-FLEX JOINT SHIELD PO) Take 1 tablet by mouth daily.   pantoprazole (PROTONIX) 40 MG tablet TAKE 1 TABLET(40 MG) BY MOUTH TWICE DAILY   rosuvastatin (CRESTOR) 5 MG tablet TAKE 1 TABLET(5 MG) BY MOUTH DAILY   Spacer/Aero-Holding Chambers (AEROCHAMBER MV) inhaler Use as instructed   Syringe, Disposable, 1 ML MISC For use with B12 injections   No facility-administered encounter medications on file as of 12/07/2021.   Allergies (verified) Tylox [oxycodone-acetaminophen]   History: Past Medical History:  Diagnosis Date   Allergy    Anemia  Anxiety    Arthritis    neck, no limitations   Cancer (HCC)    SKIN   Cough    CHRONIC FROM REFLUX   Depression    GERD (gastroesophageal reflux disease)    SLEEPS WITH HOB ELEVATED   Hyperlipidemia    Past Surgical History:  Procedure Laterality Date   BACK SURGERY  1980   Ruptured Disc   CATARACT EXTRACTION W/PHACO Right 11/28/2015   Procedure: CATARACT EXTRACTION PHACO AND INTRAOCULAR LENS PLACEMENT ([place]);  Surgeon: Estill Cotta, MD;  Location: [facility];  Service: Ophthalmology;  Laterality: Right;  [place] AP% CDE fluid pack lot # [id number]   CATARACT EXTRACTION W/PHACO Left 12/19/2015   Procedure: CATARACT EXTRACTION PHACO AND INTRAOCULAR LENS PLACEMENT ([facility]);  Surgeon: Estill Cotta, MD;  Location: [facility];  Service: Ophthalmology;  Laterality: Left;  US:1:08.2 AP%:56.7% CDE:28.10 Lot# [id number]   COLONOSCOPY     COLONOSCOPY WITH PROPOFOL N/A 10/10/2017   Procedure: COLONOSCOPY WITH PROPOFOL;  Surgeon: Lucilla Lame, MD;  Location: [place];  Service: Endoscopy;  Laterality: N/A;   POLYPECTOMY  10/10/2017   Procedure: POLYPECTOMY;  Surgeon: Lucilla Lame, MD;  Location: [facility];  Service: Endoscopy;;   Family History  Problem Relation Age of Onset   Hyperlipidemia Sister    Fibromyalgia Sister    Heart disease Brother    Diabetes Brother    Hypertension Brother    Breast cancer Maternal Aunt 75   Diabetes Paternal Grandmother    Social History   Socioeconomic History   Marital status: Widowed    Spouse name: Not on file   Number of children: Not on file   Years of education: Not on file   Highest education level: Not on file  Occupational History   Not on file  Tobacco Use   Smoking status: Former    Packs/day: 0.75    Years: 15.00    Pack years: 11.25    Types: Cigarettes    Quit date: 08/10/1992    Years since quitting: 29.3   Smokeless tobacco: Never  Vaping Use   Vaping Use: Never used  Substance and Sexual Activity   Alcohol use: No   Drug use: No   Sexual activity: Not Currently  Other Topics Concern   Not on file  Social History Narrative   Marital status: widowed.      Living with: alone.      Employment: none.      Tobacco: former user.      Alcohol: none      Drugs: none      Exercise: none; she tends to the farm.    Social Determinants of Health   Financial Resource Strain: Low Risk    Difficulty of Paying Living Expenses: Not hard at  all  Food Insecurity: No Food Insecurity   Worried About Charity fundraiser in the Last Year: Never true   [place] in the Last Year: Never true  Transportation Needs: No Transportation Needs   Lack of Transportation (Medical): No   Lack of Transportation (Non-Medical): No  Physical Activity: Sufficiently Active   Days of Exercise per Week: 5 days   Minutes of Exercise per Session: 60 min  Stress: No Stress Concern Present   Feeling of Stress : Not at all  Social Connections: Moderately Integrated   Frequency of Communication with Friends and Family: More than three times a week   Frequency of Social Gatherings with Friends and Family: More  than three times a week   Attends Religious Services: More than 4 times per year   Active Member of Clubs or Organizations: Yes   Attends Archivist Meetings: More than 4 times per year   Marital Status: Widowed    Tobacco Counseling Counseling given: Not Answered   Clinical Intake:  Pre-visit preparation completed: Yes        Diabetes: No  How often do you need to have someone help you when you read instructions, pamphlets, or other written materials from your doctor or pharmacy?: 1 - Never    Interpreter Needed?: No      Activities of Daily Living In your present state of health, do you have any difficulty performing the following activities: 12/07/2021  Hearing? N  Vision? N  Difficulty concentrating or making decisions? N  Walking or climbing stairs? N  Dressing or bathing? N  Doing errands, shopping? N  Preparing Food and eating ? N  Using the Toilet? N  In the past six months, have you accidently leaked urine? N  Do you have problems with loss of bowel control? N  Managing your Medications? N  Managing your Finances? N  Housekeeping or managing your Housekeeping? N  Some recent data might be hidden   Patient Care Team: Crecencio Mc, MD as PCP - General (Internal Medicine)  Indicate any  recent Medical Services you may have received from other than [facility] providers in the past year (date may be approximate).     Assessment:   This is a routine wellness examination for [place].  Virtual Visit via Telephone Note  I connected with  Ilene Qua on 12/07/21 at  9:45 AM EST by telephone and verified that I am speaking with the correct person using two identifiers.  Persons participating in the virtual visit: patient/Nurse Health Advisor   I discussed the limitations, risks, security and privacy concerns of performing an evaluation and management service by telephone and the availability of in person appointments. The patient expressed understanding and agreed to proceed.  Interactive audio and video telecommunications were attempted between this nurse and patient, however failed, due to patient having technical difficulties OR patient did not have access to video capability.  We continued and completed visit with audio only.  Some vital signs may be absent or patient reported.   Hearing/Vision screen Hearing Screening - Comments:: Patient is able to hear conversational tones without difficulty. No issues reported.  Vision Screening - Comments:: Followed by [facility]  Annual visits Cataract extraction, bilateral They have regular follow up with the ophthalmologist  Dietary issues and exercise activities discussed: Current Exercise Habits: Home exercise routine, Intensity: Mild Healthy diet Good water intake   Goals Addressed               This Visit's Progress     Patient Stated     DIET - INCREASE LEAN PROTEINS (pt-stated)        Weight 127lb Low carb diet Stay active      Other     Increase physical activity   On track     Walk for exercise       Depression Screen PHQ 2/9 Scores 12/07/2021 08/30/2021 07/12/2021 12/06/2020 12/04/2019 06/26/2018 06/25/2017  PHQ - 2 Score 0 0 1 0 1 0 0  PHQ- 9 Score - - - - - - 0    Fall Risk Fall Risk   12/07/2021 08/30/2021 07/12/2021 12/06/2020 06/22/2020  Falls in the past  year? 0 0 0 0 0  Number falls in past yr: 0 - - 0 -  Injury with Fall? 0 - - 0 -  Risk for fall due to : - No Fall Risks - - -  Follow up Falls evaluation completed Falls evaluation completed Falls evaluation completed Falls evaluation completed Falls evaluation completed   [place]: Home free of loose throw rugs in walkways, pet beds, electrical cords, etc? Yes  Adequate lighting in your home to reduce risk of falls? Yes   ASSISTIVE DEVICES UTILIZED TO PREVENT FALLS: Life alert? No  Use of a cane, walker or w/c? No  Grab bars in the bathroom? Yes Shower chair or bench in shower? Yes  Comfort chair height toilet? Yes   TIMED UP AND GO: Was the test performed? No .   Cognitive Function: Patient is alert and oriented x3.  MMSE - Mini Mental State Exam 06/26/2018  Orientation to time 5  Orientation to Place 5  Registration 3  Attention/ Calculation 5  Recall 3  Language- name 2 objects 2  Language- repeat 1  Language- follow 3 step command 3  Language- read & follow direction 1  Write a sentence 1  Copy design 1  Total score 30     6CIT Screen 12/04/2019  What Year? 0 points  What month? 0 points  What time? 0 points   Immunizations Immunization History  Administered Date(s) Administered   Fluad Quad(high Dose 65+) 08/25/2019, 09/06/2020, 08/22/2021   Influenza Split 08/10/2012   Influenza, High Dose Seasonal PF 09/14/2016, 09/03/2017, 08/19/2018   Influenza,inj,Quad PF,6+ Mos 08/20/2013, 08/26/2014, 09/06/2015   Moderna Sars-Covid-2 Vaccination 12/22/2019, 01/19/2020, 09/26/2020   Pneumococcal Conjugate-13 02/25/2014   Pneumococcal Polysaccharide-23 02/18/2013, 06/22/2020   Tdap 12/01/2010, 07/18/2021   Zoster Recombinat (Shingrix) 10/23/2019, 02/10/2020   Zoster, Live 10/20/2015   Screening Tests Health Maintenance  Topic Date Due   COVID-19 Vaccine (4 -  Booster for Moderna series) 12/23/2021 (Originally 11/21/2020)   MAMMOGRAM  06/01/2022   COLONOSCOPY (Pts 45-85yrs Insurance coverage will need to be confirmed)  10/10/2022   TETANUS/TDAP  07/19/2031   Pneumonia Vaccine 37+ Years old  Completed   INFLUENZA VACCINE  Completed   DEXA SCAN  Completed   Hepatitis C Screening  Completed   Zoster Vaccines- Shingrix  Completed   HPV VACCINES  Aged Out   Health Maintenance There are no preventive care reminders to display for this patient.  Lung Cancer Screening: (Low Dose CT Chest recommended if Age 29-80 years, 30 pack-year currently smoking OR have quit w/in 15years.) does not qualify.   Vision Screening: Recommended annual ophthalmology exams for early detection of glaucoma and other disorders of the eye.  Dental Screening: Recommended annual dental exams for proper oral hygiene  Community Resource Referral / Chronic Care Management: CRR required this visit?  No   CCM required this visit?  No      Plan:   Keep all routine maintenance appointments.   I have personally reviewed and noted the following in the patients chart:   Medical and social history Use of alcohol, tobacco or illicit drugs  Current medications and supplements including opioid prescriptions. Not taking opioid. Functional ability and status Nutritional status Physical activity Advanced directives List of other physicians Hospitalizations, surgeries, and ER visits in previous 12 months Vitals Screenings to include cognitive, depression, and falls Referrals and appointments  In addition, I have reviewed and discussed with patient certain preventive protocols, quality  metrics, and best practice recommendations. A written personalized care plan for preventive services as well as general preventive health recommendations were provided to patient.     OBrien-Blaney, Shelden Raborn L, LPN   06/29/5630     I have reviewed the above information and agree with above.    Deborra Medina, MD

## 2021-12-07 NOTE — Patient Instructions (Addendum)
Ms. Nancy Hamilton , Thank you for taking time to come for your Medicare Wellness Visit. I appreciate your ongoing commitment to your health goals. Please review the following plan we discussed and let me know if I can assist you in the future.   These are the goals we discussed:  Goals       Patient Stated     DIET - INCREASE LEAN PROTEINS (pt-stated)      Weight 127lb Low carb diet Stay active      Other     Increase physical activity      Walk for exercise        This is a list of the screening recommended for you and due dates:  Health Maintenance  Topic Date Due   COVID-19 Vaccine (4 - Booster for Moderna series) 12/23/2021*   Mammogram  06/01/2022   Colon Cancer Screening  10/10/2022   Tetanus Vaccine  07/19/2031   Pneumonia Vaccine  Completed   Flu Shot  Completed   DEXA scan (bone density measurement)  Completed   Hepatitis C Screening: USPSTF Recommendation to screen - Ages 66-79 yo.  Completed   Zoster (Shingles) Vaccine  Completed   HPV Vaccine  Aged Out  *Topic was postponed. The date shown is not the original due date.    Advanced directives: End of life planning; Advance aging; Advanced directives discussed.  Copy of current HCPOA/Living Will requested.    Conditions/risks identified: none new  Follow up in one year for your annual wellness visit    Preventive Care 65 Years and Older, Female Preventive care refers to lifestyle choices and visits with your health care provider that can promote health and wellness. What does preventive care include? A yearly physical exam. This is also called an annual well check. Dental exams once or twice a year. Routine eye exams. Ask your health care provider how often you should have your eyes checked. Personal lifestyle choices, including: Daily care of your teeth and gums. Regular physical activity. Eating a healthy diet. Avoiding tobacco and drug use. Limiting alcohol use. Practicing safe sex. Taking low-dose  aspirin every day. Taking vitamin and mineral supplements as recommended by your health care provider. What happens during an annual well check? The services and screenings done by your health care provider during your annual well check will depend on your age, overall health, lifestyle risk factors, and family history of disease. Counseling  Your health care provider may ask you questions about your: Alcohol use. Tobacco use. Drug use. Emotional well-being. Home and relationship well-being. Sexual activity. Eating habits. History of falls. Memory and ability to understand (cognition). Work and work Statistician. Reproductive health. Screening  You may have the following tests or measurements: Height, weight, and BMI. Blood pressure. Lipid and cholesterol levels. These may be checked every 5 years, or more frequently if you are over 24 years old. Skin check. Lung cancer screening. You may have this screening every year starting at age 49 if you have a 30-pack-year history of smoking and currently smoke or have quit within the past 15 years. Fecal occult blood test (FOBT) of the stool. You may have this test every year starting at age 73. Flexible sigmoidoscopy or colonoscopy. You may have a sigmoidoscopy every 5 years or a colonoscopy every 10 years starting at age 77. Hepatitis C blood test. Hepatitis B blood test. Sexually transmitted disease (STD) testing. Diabetes screening. This is done by checking your blood sugar (glucose) after you have not eaten  for a while (fasting). You may have this done every 1-3 years. Bone density scan. This is done to screen for osteoporosis. You may have this done starting at age 17. Mammogram. This may be done every 1-2 years. Talk to your health care provider about how often you should have regular mammograms. Talk with your health care provider about your test results, treatment options, and if necessary, the need for more tests. Vaccines  Your  health care provider may recommend certain vaccines, such as: Influenza vaccine. This is recommended every year. Tetanus, diphtheria, and acellular pertussis (Tdap, Td) vaccine. You may need a Td booster every 10 years. Zoster vaccine. You may need this after age 45. Pneumococcal 13-valent conjugate (PCV13) vaccine. One dose is recommended after age 95. Pneumococcal polysaccharide (PPSV23) vaccine. One dose is recommended after age 17. Talk to your health care provider about which screenings and vaccines you need and how often you need them. This information is not intended to replace advice given to you by your health care provider. Make sure you discuss any questions you have with your health care provider. Document Released: 11/25/2015 Document Revised: 07/18/2016 Document Reviewed: 08/30/2015 Elsevier Interactive Patient Education  2017 [place] Prevention in the Home Falls can cause injuries. They can happen to people of all ages. There are many things you can do to make your home safe and to help prevent falls. What can I do on the outside of my home? Regularly fix the edges of walkways and driveways and fix any cracks. Remove anything that might make you trip as you walk through a door, such as a raised step or threshold. Trim any bushes or trees on the path to your home. Use bright outdoor lighting. Clear any walking paths of anything that might make someone trip, such as rocks or tools. Regularly check to see if handrails are loose or broken. Make sure that both sides of any steps have handrails. Any raised decks and porches should have guardrails on the edges. Have any leaves, snow, or ice cleared regularly. Use sand or salt on walking paths during winter. Clean up any spills in your garage right away. This includes oil or grease spills. What can I do in the bathroom? Use night lights. Install grab bars by the toilet and in the tub and shower. Do not use towel bars as  grab bars. Use non-skid mats or decals in the tub or shower. If you need to sit down in the shower, use a plastic, non-slip stool. Keep the floor dry. Clean up any water that spills on the floor as soon as it happens. Remove soap buildup in the tub or shower regularly. Attach bath mats securely with double-sided non-slip rug tape. Do not have throw rugs and other things on the floor that can make you trip. What can I do in the bedroom? Use night lights. Make sure that you have a light by your bed that is easy to reach. Do not use any sheets or blankets that are too big for your bed. They should not hang down onto the floor. Have a firm chair that has side arms. You can use this for support while you get dressed. Do not have throw rugs and other things on the floor that can make you trip. What can I do in the kitchen? Clean up any spills right away. Avoid walking on wet floors. Keep items that you use a lot in easy-to-reach places. If you need to reach something  above you, use a strong step stool that has a grab bar. Keep electrical cords out of the way. Do not use floor polish or wax that makes floors slippery. If you must use wax, use non-skid floor wax. Do not have throw rugs and other things on the floor that can make you trip. What can I do with my stairs? Do not leave any items on the stairs. Make sure that there are handrails on both sides of the stairs and use them. Fix handrails that are broken or loose. Make sure that handrails are as long as the stairways. Check any carpeting to make sure that it is firmly attached to the stairs. Fix any carpet that is loose or worn. Avoid having throw rugs at the top or bottom of the stairs. If you do have throw rugs, attach them to the floor with carpet tape. Make sure that you have a light switch at the top of the stairs and the bottom of the stairs. If you do not have them, ask someone to add them for you. What else can I do to help prevent  falls? Wear shoes that: Do not have high heels. Have rubber bottoms. Are comfortable and fit you well. Are closed at the toe. Do not wear sandals. If you use a stepladder: Make sure that it is fully opened. Do not climb a closed stepladder. Make sure that both sides of the stepladder are locked into place. Ask someone to hold it for you, if possible. Clearly mark and make sure that you can see: Any grab bars or handrails. First and last steps. Where the edge of each step is. Use tools that help you move around (mobility aids) if they are needed. These include: Canes. Walkers. Scooters. Crutches. Turn on the lights when you go into a dark area. Replace any light bulbs as soon as they burn out. Set up your furniture so you have a clear path. Avoid moving your furniture around. If any of your floors are uneven, fix them. If there are any pets around you, be aware of where they are. Review your medicines with your doctor. Some medicines can make you feel dizzy. This can increase your chance of falling. Ask your doctor what other things that you can do to help prevent falls. This information is not intended to replace advice given to you by your health care provider. Make sure you discuss any questions you have with your health care provider. Document Released: 08/25/2009 Document Revised: 04/05/2016 Document Reviewed: 12/03/2014 Elsevier Interactive Patient Education  2017 [employer].

## 2021-12-21 ENCOUNTER — Encounter: Payer: Self-pay | Admitting: Pulmonary Disease

## 2021-12-21 ENCOUNTER — Other Ambulatory Visit: Payer: Self-pay

## 2021-12-21 ENCOUNTER — Ambulatory Visit: Payer: PPO | Admitting: Pulmonary Disease

## 2021-12-21 VITALS — BP 122/72 | HR 73 | Temp 98.2°F | Ht 62.0 in | Wt 135.6 lb

## 2021-12-21 DIAGNOSIS — K219 Gastro-esophageal reflux disease without esophagitis: Secondary | ICD-10-CM | POA: Diagnosis not present

## 2021-12-21 DIAGNOSIS — J3089 Other allergic rhinitis: Secondary | ICD-10-CM | POA: Diagnosis not present

## 2021-12-21 DIAGNOSIS — J302 Other seasonal allergic rhinitis: Secondary | ICD-10-CM | POA: Diagnosis not present

## 2021-12-21 DIAGNOSIS — J45991 Cough variant asthma: Secondary | ICD-10-CM | POA: Diagnosis not present

## 2021-12-21 MED ORDER — MONTELUKAST SODIUM 10 MG PO TABS: 10.0000 mg | ORAL_TABLET | Freq: Every day | ORAL | 3 refills | Status: DC

## 2021-12-21 NOTE — Progress Notes (Unsigned)
Subjective:    Patient ID: Nancy Hamilton, female    DOB: [birth year], 76 y.o.   MRN: [id number] Patient Care Team: Crecencio Mc, MD as PCP - General (Internal Medicine)  Chief Complaint  Patient presents with   Follow-up    HPI This is a 76 year old remote former smoker (quit 1993) who presents for follow-up on the issue of cough variant asthma with a component of LPR trigger.  She was last seen here 16 Mar 2021, at that time she only requested a new spacer which was provided for her we also discussed her PFTs which were essentially normal and she was instructed to continue Symbicort twice a day.  She has had no exacerbations in the interim.  She has noted some increase cough due to increased nasal congestion and postnasal drip which has plagued her over the last several months.  Cough is nonproductive, just feels like a "tickle in the back of her throat". She continues to be compliant with Symbicort twice a day.   Compliant with PPI and antireflux measures.  LPR/GERD is another trigger for her but this has been controlled of late.   She had a positive methacholine test May 2018. She had PFTs performed on 14 February 2021 and these were normal across the board.     Review of Systems A 10 point review of systems was performed and it is as noted above otherwise negative.  Patient Active Problem List   Diagnosis Date Noted   Cerumen impaction 07/12/2021   Thoracic aortic atherosclerosis ([place]) 12/21/2018   Stress incontinence 03/22/2018   Onychomycosis 03/22/2018   Benign paroxysmal positional vertigo of right ear 11/14/2016   Cervical disc disorder with radiculopathy of cervical region 03/09/2016   Shoulder pain, right 03/09/2016   Statin intolerance 06/08/2014   B12 deficiency 06/07/2014   Iron deficiency 06/07/2014   Adenomatous polyps 02/28/2014   Generalized anxiety disorder 12/01/2012   GERD (gastroesophageal reflux disease) 12/01/2012   Asthma in adult, mild persistent,  uncomplicated 53/61/4431   Hyperlipidemia LDL goal <100 12/01/2012   Social History   Tobacco Use   Smoking status: Former    Packs/day: 0.75    Years: 15.00    Pack years: 11.25    Types: Cigarettes    Quit date: 08/10/1992    Years since quitting: 29.3   Smokeless tobacco: Never  Substance Use Topics   Alcohol use: No   Allergies  Allergen Reactions   Tylox [Oxycodone-Acetaminophen] Rash   Current Meds  Medication Sig   albuterol (VENTOLIN HFA) 108 (90 Base) MCG/ACT inhaler Inhale 2 puffs into the lungs every 6 (six) hours as needed for wheezing or shortness of breath (cough).   Ascorbic Acid (VITAMIN C) 1000 MG tablet Take 1,000 mg by mouth daily.   Cholecalciferol 50 MCG (2000 UT) CAPS Take 1 capsule by mouth daily.   cyanocobalamin (,VITAMIN B-12,) 1000 MCG/ML injection Inject 1 ml intrumuscularly daily x 3, then weekly x 4, then monthly thereafter   Ferrous Sulfate (IRON) 325 (65 Fe) MG TABS TAKE 1 TABLET BY MOUTH EVERY DAY WITH BREAKFAST   FLUoxetine (PROZAC) 20 MG capsule Take 1 capsule (20 mg total) by mouth daily.   fluticasone (FLONASE) 50 MCG/ACT nasal spray Place 2 sprays into both nostrils daily.   levocetirizine (XYZAL) 5 MG tablet TAKE 1 TABLET(5 MG) BY MOUTH EVERY EVENING   meclizine (ANTIVERT) 12.5 MG tablet Take 1 tablet (12.5 mg total) by mouth 3 (three) times daily as needed for dizziness.  Misc Natural Products (OSTEO BI-FLEX JOINT SHIELD PO) Take 1 tablet by mouth daily.   pantoprazole (PROTONIX) 40 MG tablet TAKE 1 TABLET(40 MG) BY MOUTH TWICE DAILY   rosuvastatin (CRESTOR) 5 MG tablet TAKE 1 TABLET(5 MG) BY MOUTH DAILY   Spacer/Aero-Holding Chambers (AEROCHAMBER MV) inhaler Use as instructed   Syringe, Disposable, 1 ML MISC For use with B12 injections   [DISCONTINUED] budesonide-formoterol (SYMBICORT) 160-4.5 MCG/ACT inhaler Inhale 2 puffs into the lungs 2 (two) times daily.   [DISCONTINUED] Coenzyme Q10 (CO Q 10) 100 MG CAPS Take 1 capsule by mouth  daily.   [DISCONTINUED] docusate sodium (COLACE) 100 MG capsule Take 1 capsule (100 mg total) by mouth 2 (two) times daily.   [DISCONTINUED] Lactobacillus (PROBIOTIC ACIDOPHILUS PO) Take 1 capsule by mouth daily.   Immunization History  Administered Date(s) Administered   Fluad Quad(high Dose 65+) 08/25/2019, 09/06/2020, 08/22/2021   Influenza Split 08/10/2012   Influenza, High Dose Seasonal PF 09/14/2016, 09/03/2017, 08/19/2018   Influenza,inj,Quad PF,6+ Mos 08/20/2013, 08/26/2014, 09/06/2015   Moderna Sars-Covid-2 Vaccination 12/22/2019, 01/19/2020, 09/26/2020   Pneumococcal Conjugate-13 02/25/2014   Pneumococcal Polysaccharide-23 02/18/2013, 06/22/2020   Tdap 12/01/2010, 07/18/2021   Zoster Recombinat (Shingrix) 10/23/2019, 02/10/2020   Zoster, Live 10/20/2015       Objective:   Physical Exam BP 122/72 (BP Location: Left Arm, Patient Position: Sitting, Cuff Size: Normal)   Pulse 73   Temp 98.2 F (36.8 C) (Oral)   Ht 5\' 2"  (1.575 m)   Wt 135 lb 9.6 oz (61.5 kg)   SpO2 99%   BMI 24.80 kg/m  GENERAL: Well-developed well-nourished, fit appearing, fully ambulatory, no acute distress. HEAD: Normocephalic, atraumatic.  EYES: Pupils equal, round, reactive to light.  No scleral icterus.  MOUTH: Nose/mouth/throat not examined due to masking requirements for COVID 19. NECK: Supple. No thyromegaly. Trachea midline. No JVD.  No adenopathy. PULMONARY: Lungs clear to auscultation bilaterally.  Good air entry bilaterally. CARDIOVASCULAR: S1 and S2. Regular rate and rhythm.  No rubs, murmurs or gallops heard. GASTROINTESTINAL: No abdominal distention noted. MUSCULOSKELETAL: No joint deformity, no clubbing, no edema.  NEUROLOGIC: Awake, alert, fully oriented.  No focal deficit noted.  No gait disturbance noted. SKIN: Intact,warm,dry.  On limited exam no rashes. PSYCH: Mood and behavior appropriate.      Assessment & Plan:     ICD-10-CM   1. Cough variant asthma  J45.991     Postnasal drip triggering cough Add Singulair 10 mg daily Continue Symbicort    2. Laryngopharyngeal reflux (LPR)  K21.9    Compliant with PPI Compliant with antireflux measures No recent issues    3. Perennial allergic rhinitis with seasonal variation  J30.89    J30.2    Worse over the last several months Post nasal drip triggers cough Trial of Singulair as above     Meds ordered this encounter  Medications   montelukast (SINGULAIR) 10 MG tablet    Sig: Take 1 tablet (10 mg total) by mouth daily.    Dispense:  30 tablet    Refill:  3   See the patient in follow-up in 6 months time she is to contact us prior to that time should any new difficulties arise.  Renold Don, MD Advanced Bronchoscopy P[facility]    *This note was dictated using voice recognition software/Dragon.  Despite best efforts to proofread, errors can occur which can change the meaning. Any transcriptional errors that result from this process are unintentional and may not be fully corrected  at the time of dictation.

## 2021-12-21 NOTE — Patient Instructions (Signed)
We are adding Singulair to your regimen this will hopefully help with your cough and your nasal symptoms.  Continue using Symbicort 2 puffs twice a day.  Make sure you rinse your mouth well after you use the inhaler.  We will see him in follow-up in 6 months time call sooner should any new problems arise.

## 2022-01-09 ENCOUNTER — Ambulatory Visit (INDEPENDENT_AMBULATORY_CARE_PROVIDER_SITE_OTHER): Payer: PPO

## 2022-01-09 ENCOUNTER — Other Ambulatory Visit: Payer: Self-pay

## 2022-01-09 DIAGNOSIS — E538 Deficiency of other specified B group vitamins: Secondary | ICD-10-CM | POA: Diagnosis not present

## 2022-01-09 MED ORDER — CYANOCOBALAMIN 1000 MCG/ML IJ SOLN
1000.0000 ug | Freq: Once | INTRAMUSCULAR | Status: AC
Start: 2022-01-09 — End: 2022-01-09
  Administered 2022-01-09: 1000 ug via INTRAMUSCULAR

## 2022-01-09 NOTE — Progress Notes (Signed)
Patient presented for B 12 injection to left deltoid, patient voiced no concerns nor showed any signs of distress during injection. 

## 2022-01-22 ENCOUNTER — Ambulatory Visit (INDEPENDENT_AMBULATORY_CARE_PROVIDER_SITE_OTHER): Payer: PPO | Admitting: Internal Medicine

## 2022-01-22 ENCOUNTER — Encounter: Payer: Self-pay | Admitting: Internal Medicine

## 2022-01-22 DIAGNOSIS — J0110 Acute frontal sinusitis, unspecified: Secondary | ICD-10-CM | POA: Insufficient documentation

## 2022-01-22 MED ORDER — AMOXICILLIN-POT CLAVULANATE 875-125 MG PO TABS: 1.0000 | ORAL_TABLET | Freq: Two times a day (BID) | ORAL | 0 refills | Status: DC

## 2022-01-22 MED ORDER — PREDNISONE 10 MG PO TABS: ORAL_TABLET | ORAL | 0 refills | Status: DC

## 2022-01-22 NOTE — Assessment & Plan Note (Addendum)
COVID NEGATIVE. Given chronicity of symptoms, development of facial pain and exam consistent with bacterial URI,  Will treat with empiric antibiotics, steroids ; continue flonaxe xyzal and singulair  ?

## 2022-01-22 NOTE — Progress Notes (Signed)
Telephone  Note ? ?This visit type was conducted due to national recommendations for restrictions regarding the COVID-19 pandemic (e.g. social distancing).  This format is felt to be most appropriate for this patient at this time.  All issues noted in this document were discussed and addressed.  No physical exam was performed (except for noted visual exam findings with Video Visits).  ? ?I attempted to connect withNAME@ on 01/22/22 at 10:00 AM EDT by a video enabled telemedicine application  and verified that I am speaking with the correct person using two identifiers. ?Location patient: home ?Location provider: work or home office ?Persons participating in the virtual visit: patient, provider ? ?I discussed the limitations, risks, security and privacy concerns of performing an evaluation and management service by telephone and the availability of in person appointments. I also discussed with the patient that there may be a patient responsible charge related to this service. The patient expressed understanding and agreed to proceed. ? ?Interactive audio and video telecommunications were attempted between this provider and patient, however failed, due to patient having technical difficulties .  We continued and completed visit with audio only.  ? ?Reason for visit: URI ? ?HPI: ? ? ?7 to 10 day history of sinus congestion  and sore throat.  Sinus drainage has become  purulent , cough has persistent and has been  keeping her up at night.  No fevers . COVID negative 6 days ago.  using flonase,  and OTC medicine for cough and congestion  ? ? ?ROS: See pertinent positives and negatives per HPI. ?oa ?Past Medical History:  ?Diagnosis Date  ? Allergy   ? Anemia   ? Anxiety   ? Arthritis   ? neck, no limitations  ? Cancer [facility])   ? SKIN  ? Cough   ? CHRONIC FROM REFLUX  ? Depression   ? GERD (gastroesophageal reflux disease)   ? SLEEPS WITH HOB ELEVATED  ? Hyperlipidemia   ? ? ?Past Surgical History:  ?Procedure Laterality  Date  ? [place]  ? Ruptured Disc  ? CATARACT EXTRACTION W/PHACO Right 11/28/2015  ? Procedure: CATARACT EXTRACTION PHACO AND INTRAOCULAR LENS PLACEMENT (IOC);  Surgeon: Estill Cotta, MD;  Location: [facility] ORS;  Service: Ophthalmology;  Laterality: Right;  [place] ?AP% ?CDE ?fluid pack lot # [id number]  ? CATARACT EXTRACTION W/PHACO Left 12/19/2015  ? Procedure: CATARACT EXTRACTION PHACO AND INTRAOCULAR LENS PLACEMENT (IOC);  Surgeon: Estill Cotta, MD;  Location: [facility] OR[facility];  Service: Ophthalmology;  Laterality: Left;  US:1:08.2 ?AP%:56.7% ?CDE:28.10 ?Lot# [id number]  ? COLONOSCOPY    ? COLONOSCOPY WITH PROPOFOL N/A 10/10/2017  ? Procedure: COLONOSCOPY WITH PROPOFOL;  Surgeon: Lucilla Lame, MD;  Location: [place];  Service: Endoscopy;  Laterality: N/A;  ? POLYPECTOMY  10/10/2017  ? Procedure: POLYPECTOMY;  Surgeon: Lucilla Lame, MD;  Location: [place];  Service: Endoscopy;;  ? ? ?Family History  ?Problem Relation Age of Onset  ? Hyperlipidemia Sister   ? Fibromyalgia Sister   ? Heart disease Brother   ? Diabetes Brother   ? Hypertension Brother   ? Breast cancer Maternal Aunt 75  ? Diabetes Paternal Grandmother   ? ? ?SOCIAL HX:  reports that she quit smoking about 29 years ago. Her smoking use included cigarettes. She has a 11.25 pack-year smoking history. She has never used smokeless tobacco. She reports that she does not drink alcohol and does not use drugs.  ? ? ?Current Outpatient Medications:  ?  albuterol (VENTOLIN  HFA) 108 (90 Base) MCG/ACT inhaler, Inhale 2 puffs into the lungs every 6 (six) hours as needed for wheezing or shortness of breath (cough)., Disp: 18 g, Rfl: 2 ?  amoxicillin-clavulanate (AUGMENTIN) 875-125 MG tablet, Take 1 tablet by mouth 2 (two) times daily., Disp: 14 tablet, Rfl: 0 ?  Ascorbic Acid (VITAMIN C) 1000 MG tablet, Take 1,000 mg by mouth daily., Disp: , Rfl:  ?  Cholecalciferol 50 MCG (2000 UT) CAPS, Take 1 capsule by mouth daily., Disp: , Rfl:  ?   cyanocobalamin (,VITAMIN B-12,) 1000 MCG/ML injection, Inject 1 ml intrumuscularly daily x 3, then weekly x 4, then monthly thereafter, Disp: 10 mL, Rfl: 2 ?  FLUoxetine (PROZAC) 20 MG capsule, Take 1 capsule (20 mg total) by mouth daily., Disp: 90 capsule, Rfl: 1 ?  fluticasone (FLONASE) 50 MCG/ACT nasal spray, Place 2 sprays into both nostrils daily., Disp: 48 g, Rfl: 1 ?  hydrocortisone 2.5 % ointment, Apply topically., Disp: , Rfl:  ?  levocetirizine (XYZAL) 5 MG tablet, TAKE 1 TABLET(5 MG) BY MOUTH EVERY EVENING, Disp: 90 tablet, Rfl: 1 ?  meclizine (ANTIVERT) 12.5 MG tablet, Take 1 tablet (12.5 mg total) by mouth 3 (three) times daily as needed for dizziness., Disp: 30 tablet, Rfl: 0 ?  montelukast (SINGULAIR) 10 MG tablet, Take 1 tablet (10 mg total) by mouth daily., Disp: 30 tablet, Rfl: 3 ?  pantoprazole (PROTONIX) 40 MG tablet, TAKE 1 TABLET(40 MG) BY MOUTH TWICE DAILY, Disp: 180 tablet, Rfl: 1 ?  predniSONE (DELTASONE) 10 MG tablet, 6 tablets on Day 1 , then reduce by 1 tablet daily until gone, Disp: 21 tablet, Rfl: 0 ?  rosuvastatin (CRESTOR) 5 MG tablet, TAKE 1 TABLET(5 MG) BY MOUTH DAILY, Disp: 90 tablet, Rfl: 3 ?  Spacer/Aero-Holding Chambers (AEROCHAMBER MV) inhaler, Use as instructed, Disp: 1 each, Rfl: 0 ?  Syringe, Disposable, 1 ML MISC, For use with B12 injections, Disp: 25 each, Rfl: 1 ? ?EXAM: ? ? ?General impression: alert, cooperative and articulate.  No signs of being in distress  ?Lungs: speech is fluent sentence length suggests that patient is not short of breath and not punctuated by cough, sneezing or sniffing. Marland Kitchen   ?Psych: affect normal.  speech is articulate and non pressured .  Denies suicidal thoughts  ? ? ?ASSESSMENT AND PLAN: ? ?Discussed the following assessment and plan: ? ?Acute non-recurrent frontal sinusitis ? ?Sinusitis, acute frontal ?COVID NEGATIVE. Given chronicity of symptoms, development of facial pain and exam consistent with bacterial URI,  Will treat with empiric  antibiotics, steroids ; continue flonaxe xyzal and singulair  ? ?  ?I discussed the assessment and treatment plan with the patient. The patient was provided an opportunity to ask questions and all were answered. The patient agreed with the plan and demonstrated an understanding of the instructions. ?  ?The patient was advised to call back or seek an in-person evaluation if the symptoms worsen or if the condition fails to improve as anticipated. ? ? ?I spent  20 minutes dedicated to the care of this patient on the date of this encounter to include pre-visit review of his medical history, non  Face-to-face time with the patient , and post visit ordering of testing and therapeutics.  ? ? ?Crecencio Mc, MD   ?

## 2022-01-22 NOTE — Patient Instructions (Signed)
I am treating you for bacterial sinusitis which is a complication from your viral infection due to  persistent sinus congestion. ? ? I am prescribing an antibiotic (AUGMENTIN ) and a prednisone taper  To manage the infection and the inflammation in your ear/sinuses.  ? ?I also advise use of the following OTC meds to help with your other symptoms.  ? ?Take generic OTC benadryl 25 mg BEFORE BED TO MANAGE THE COUGH CASUED BY POST NASAL DRIP,   ? ?You can add Sudafed PE  if needed for congestion,  ? ?Please take a probiotic ( Align, Floraque or Culturelle) OR A GENERIC EQUIVALENT for three weeks since you are taking an  antibiotic to prevent a very serious antibiotic associated infection  Called closttidium dificile colitis that can cause diarrhea, multi organ failure, sepsis and death if not managed.   ?

## 2022-01-26 ENCOUNTER — Telehealth: Payer: Self-pay | Admitting: Pulmonary Disease

## 2022-01-26 NOTE — Telephone Encounter (Signed)
ATC patient in regards to her concerns. Unable to get a hold of pt. ?

## 2022-01-26 NOTE — Telephone Encounter (Signed)
Pt would like to continue her Singulair. Relayed to patient that she has refills left. Nothing further needed.  Called pharmacy to verify her refills on Singulair. ?

## 2022-02-13 ENCOUNTER — Ambulatory Visit (INDEPENDENT_AMBULATORY_CARE_PROVIDER_SITE_OTHER): Payer: PPO

## 2022-02-13 DIAGNOSIS — E538 Deficiency of other specified B group vitamins: Secondary | ICD-10-CM | POA: Diagnosis not present

## 2022-02-13 MED ORDER — CYANOCOBALAMIN 1000 MCG/ML IJ SOLN
1000.0000 ug | Freq: Once | INTRAMUSCULAR | Status: AC
  Administered 2022-02-13: 1000 ug via INTRAMUSCULAR

## 2022-02-13 NOTE — Progress Notes (Signed)
Patient presented for B 12 injection to right deltoid, patient voiced no concerns nor showed any signs of distress during injection. 

## 2022-02-28 DIAGNOSIS — D2261 Melanocytic nevi of right upper limb, including shoulder: Secondary | ICD-10-CM | POA: Diagnosis not present

## 2022-02-28 DIAGNOSIS — X32XXXA Exposure to sunlight, initial encounter: Secondary | ICD-10-CM | POA: Diagnosis not present

## 2022-02-28 DIAGNOSIS — Z85828 Personal history of other malignant neoplasm of skin: Secondary | ICD-10-CM | POA: Diagnosis not present

## 2022-02-28 DIAGNOSIS — L570 Actinic keratosis: Secondary | ICD-10-CM | POA: Diagnosis not present

## 2022-02-28 DIAGNOSIS — D485 Neoplasm of uncertain behavior of skin: Secondary | ICD-10-CM | POA: Diagnosis not present

## 2022-02-28 DIAGNOSIS — L820 Inflamed seborrheic keratosis: Secondary | ICD-10-CM | POA: Diagnosis not present

## 2022-02-28 DIAGNOSIS — L439 Lichen planus, unspecified: Secondary | ICD-10-CM | POA: Diagnosis not present

## 2022-02-28 DIAGNOSIS — D2262 Melanocytic nevi of left upper limb, including shoulder: Secondary | ICD-10-CM | POA: Diagnosis not present

## 2022-02-28 DIAGNOSIS — D225 Melanocytic nevi of trunk: Secondary | ICD-10-CM | POA: Diagnosis not present

## 2022-03-07 ENCOUNTER — Other Ambulatory Visit: Payer: Self-pay | Admitting: Internal Medicine

## 2022-03-20 ENCOUNTER — Ambulatory Visit (INDEPENDENT_AMBULATORY_CARE_PROVIDER_SITE_OTHER): Payer: PPO

## 2022-03-20 DIAGNOSIS — E538 Deficiency of other specified B group vitamins: Secondary | ICD-10-CM | POA: Diagnosis not present

## 2022-03-20 MED ORDER — CYANOCOBALAMIN 1000 MCG/ML IJ SOLN
1000.0000 ug | Freq: Once | INTRAMUSCULAR | Status: AC
  Administered 2022-03-20: 1000 ug via INTRAMUSCULAR

## 2022-03-20 NOTE — Progress Notes (Signed)
Patient presented for B 12 injection to right deltoid, patient voiced no concerns nor showed any signs of distress during injection. 

## 2022-03-23 ENCOUNTER — Other Ambulatory Visit: Payer: Self-pay | Admitting: Pulmonary Disease

## 2022-04-20 ENCOUNTER — Telehealth: Payer: Self-pay | Admitting: Internal Medicine

## 2022-04-20 DIAGNOSIS — E785 Hyperlipidemia, unspecified: Secondary | ICD-10-CM

## 2022-04-20 DIAGNOSIS — E538 Deficiency of other specified B group vitamins: Secondary | ICD-10-CM

## 2022-04-20 NOTE — Telephone Encounter (Signed)
Patient has a lab appt 04/24/22 there are no orders in.

## 2022-04-20 NOTE — Addendum Note (Signed)
Addended by: Crecencio Mc on: 04/20/2022 05:08 PM   Modules accepted: Orders

## 2022-04-24 ENCOUNTER — Other Ambulatory Visit: Payer: PPO

## 2022-04-24 ENCOUNTER — Ambulatory Visit (INDEPENDENT_AMBULATORY_CARE_PROVIDER_SITE_OTHER): Payer: PPO

## 2022-04-24 DIAGNOSIS — E785 Hyperlipidemia, unspecified: Secondary | ICD-10-CM | POA: Diagnosis not present

## 2022-04-24 DIAGNOSIS — E538 Deficiency of other specified B group vitamins: Secondary | ICD-10-CM

## 2022-04-24 LAB — VITAMIN B12: Vitamin B-12: 474 pg/mL (ref 211–911)

## 2022-04-24 LAB — COMPREHENSIVE METABOLIC PANEL
ALT: 11 U/L (ref 0–35)
AST: 12 U/L (ref 0–37)
Albumin: 4.1 g/dL (ref 3.5–5.2)
Alkaline Phosphatase: 71 U/L (ref 39–117)
BUN: 11 mg/dL (ref 6–23)
CO2: 30 mEq/L (ref 19–32)
Calcium: 9.4 mg/dL (ref 8.4–10.5)
Chloride: 102 mEq/L (ref 96–112)
Creatinine, Ser: 0.93 mg/dL (ref 0.40–1.20)
GFR: 60.34 mL/min (ref >60.00)
Glucose, Bld: 89 mg/dL (ref 70–99)
Potassium: 4.6 mEq/L (ref 3.5–5.1)
Sodium: 140 mEq/L (ref 135–145)
Total Bilirubin: 0.5 mg/dL (ref 0.2–1.2)
Total Protein: 6.3 g/dL (ref 6.0–8.3)

## 2022-04-24 MED ORDER — CYANOCOBALAMIN 1000 MCG/ML IJ SOLN
1000.0000 ug | Freq: Once | INTRAMUSCULAR | Status: AC
  Administered 2022-04-24: 1000 ug via INTRAMUSCULAR

## 2022-04-24 NOTE — Progress Notes (Signed)
Patient presented for B 12 injection to left deltoid, patient voiced no concerns nor showed any signs of distress during injection. 

## 2022-04-25 ENCOUNTER — Telehealth: Payer: Self-pay | Admitting: Pulmonary Disease

## 2022-04-25 ENCOUNTER — Other Ambulatory Visit: Payer: Self-pay | Admitting: Internal Medicine

## 2022-04-25 DIAGNOSIS — Z1231 Encounter for screening mammogram for malignant neoplasm of breast: Secondary | ICD-10-CM

## 2022-04-25 LAB — LIPID PANEL W/REFLEX DIRECT LDL
Cholesterol: 174 mg/dL (ref <200)
HDL: 79 mg/dL (ref >=50)
LDL Cholesterol (Calc): 79 mg/dL (calc)
Non-HDL Cholesterol (Calc): 95 mg/dL (calc) (ref <130)
Total CHOL/HDL Ratio: 2.2 (calc) (ref <5.0)
Triglycerides: 81 mg/dL (ref <150)

## 2022-04-25 MED ORDER — MONTELUKAST SODIUM 10 MG PO TABS: 10.0000 mg | ORAL_TABLET | Freq: Every day | ORAL | 3 refills | Status: DC

## 2022-04-25 NOTE — Telephone Encounter (Signed)
90 day supply of Singulair has been sent to preferred pharmacy.  Patient is aware and voiced her understanding.  Nothing further needed.

## 2022-05-02 ENCOUNTER — Other Ambulatory Visit: Payer: Self-pay | Admitting: Pulmonary Disease

## 2022-05-29 ENCOUNTER — Ambulatory Visit (INDEPENDENT_AMBULATORY_CARE_PROVIDER_SITE_OTHER): Payer: PPO

## 2022-05-29 DIAGNOSIS — E538 Deficiency of other specified B group vitamins: Secondary | ICD-10-CM

## 2022-05-29 MED ORDER — CYANOCOBALAMIN 1000 MCG/ML IJ SOLN
1000.0000 ug | Freq: Once | INTRAMUSCULAR | Status: AC
  Administered 2022-05-29: 1000 ug via INTRAMUSCULAR

## 2022-05-29 NOTE — Progress Notes (Signed)
Patient presented for B 12 injection to left deltoid, patient voiced no concerns nor showed any signs of distress during injection. 

## 2022-05-30 ENCOUNTER — Telehealth: Payer: Self-pay

## 2022-05-30 ENCOUNTER — Telehealth: Payer: Self-pay | Admitting: Internal Medicine

## 2022-05-30 DIAGNOSIS — Z1211 Encounter for screening for malignant neoplasm of colon: Secondary | ICD-10-CM

## 2022-05-30 NOTE — Telephone Encounter (Signed)
Patient has requested to have an EGD with her Colonoscopy.  Dr. Allen Norris performed last colonoscopy 10/10/17 however her request for EGD for acid relux will need a referral sent from PCP.  EGD requires an office visit and she has not been seen by Dr. Allen Norris to discuss her acid relux.  I've asked her to contact her PCP to request a referral to be sent to schedule office visit.  Informed her once we receive the referral we will schedule an office visit, then schedule her for her repeat colonoscopy (history of colon polyps) and EGD.  Thanks,  [place], [place]

## 2022-05-30 NOTE — Telephone Encounter (Signed)
Patient called stating she needs a referral for Endoscopy before her colonoscopy.

## 2022-05-30 NOTE — Telephone Encounter (Signed)
Referral has been put in process.

## 2022-05-31 ENCOUNTER — Other Ambulatory Visit: Payer: Self-pay

## 2022-05-31 ENCOUNTER — Telehealth: Payer: Self-pay

## 2022-05-31 DIAGNOSIS — Z86010 Personal history of colonic polyps: Secondary | ICD-10-CM

## 2022-05-31 DIAGNOSIS — K219 Gastro-esophageal reflux disease without esophagitis: Secondary | ICD-10-CM

## 2022-05-31 NOTE — Telephone Encounter (Signed)
Gastroenterology Pre-Procedure Review  Request Date: 09/28/22 Requesting Physician: Dr. Allen Norris  PATIENT REVIEW QUESTIONS: The patient responded to the following health history questions as indicated:    1. Are you having any GI issues?  Chronic cough, GERD, Acid Reflux last EGD 10/10/17 performed by Dr. Allen Norris   pt request repeat EGD        2. Do you have a personal history of Polyps? yes (last colonoscopy performed by Dr. Allen Norris 10/10/17 polyps noted) 3. Do you have a family history of Colon Cancer or Polyps? no 4. Diabetes Mellitus? no 5. Joint replacements in the past 12 months?no 6. Major health problems in the past 3 months?no 7. Any artificial heart valves, MVP, or defibrillator?no    MEDICATIONS & ALLERGIES:    Patient reports the following regarding taking any anticoagulation/antiplatelet therapy:   Plavix, Coumadin, Eliquis, Xarelto, Lovenox, Pradaxa, Brilinta, or Effient? no Aspirin? no  Patient confirms/reports the following medications:  Current Outpatient Medications  Medication Sig Dispense Refill   albuterol (VENTOLIN HFA) 108 (90 Base) MCG/ACT inhaler Inhale 2 puffs into the lungs every 6 (six) hours as needed for wheezing or shortness of breath (cough). 18 g 2   Ascorbic Acid (VITAMIN C) 1000 MG tablet Take 1,000 mg by mouth daily.     Cholecalciferol 50 MCG (2000 UT) CAPS Take 1 capsule by mouth daily.     cyanocobalamin (,VITAMIN B-12,) 1000 MCG/ML injection Inject 1 ml intrumuscularly daily x 3, then weekly x 4, then monthly thereafter 10 mL 2   FLUoxetine (PROZAC) 20 MG capsule TAKE 1 CAPSULE BY MOUTH EVERY DAY 90 capsule 1   fluticasone (FLONASE) 50 MCG/ACT nasal spray Place 2 sprays into both nostrils daily. 48 g 1   hydrocortisone 2.5 % ointment Apply topically.     levocetirizine (XYZAL) 5 MG tablet TAKE 1 TABLET(5 MG) BY MOUTH EVERY EVENING 90 tablet 1   meclizine (ANTIVERT) 12.5 MG tablet Take 1 tablet (12.5 mg total) by mouth 3 (three) times daily as needed for  dizziness. 30 tablet 0   montelukast (SINGULAIR) 10 MG tablet Take 1 tablet (10 mg total) by mouth daily. 90 tablet 3   pantoprazole (PROTONIX) 40 MG tablet TAKE 1 TABLET(40 MG) BY MOUTH TWICE DAILY 180 tablet 1   predniSONE (DELTASONE) 10 MG tablet 6 tablets on Day 1 , then reduce by 1 tablet daily until gone 21 tablet 0   rosuvastatin (CRESTOR) 5 MG tablet TAKE 1 TABLET(5 MG) BY MOUTH DAILY 90 tablet 3   Spacer/Aero-Holding Chambers (AEROCHAMBER MV) inhaler Use as instructed 1 each 0   SYMBICORT 160-4.5 MCG/ACT inhaler INHALE TWO PUFFS TWICE A DAY 30.6 g 1   Syringe, Disposable, 1 ML MISC For use with B12 injections 25 each 1   No current facility-administered medications for this visit.    Patient confirms/reports the following allergies:  Allergies  Allergen Reactions   Tylox [Oxycodone-Acetaminophen] Rash    No orders of the defined types were placed in this encounter.   AUTHORIZATION INFORMATION Primary Insurance: 1D#: Group #:  Secondary Insurance: 1D#: Group #:  SCHEDULE INFORMATION: Date: 09/28/22 Time: Location: [place]

## 2022-06-01 ENCOUNTER — Other Ambulatory Visit: Payer: Self-pay | Admitting: Internal Medicine

## 2022-06-04 ENCOUNTER — Ambulatory Visit
Admission: RE | Admit: 2022-06-04 | Discharge: 2022-06-04 | Disposition: A | Discharge status: Home / Self Care | Payer: PPO | Source: Ambulatory Visit | Attending: Internal Medicine | Admitting: Internal Medicine

## 2022-06-04 DIAGNOSIS — Z1231 Encounter for screening mammogram for malignant neoplasm of breast: Secondary | ICD-10-CM | POA: Insufficient documentation

## 2022-06-11 ENCOUNTER — Other Ambulatory Visit: Payer: Self-pay | Admitting: Internal Medicine

## 2022-06-28 DIAGNOSIS — M1711 Unilateral primary osteoarthritis, right knee: Secondary | ICD-10-CM | POA: Insufficient documentation

## 2022-06-28 DIAGNOSIS — M25469 Effusion, unspecified knee: Secondary | ICD-10-CM | POA: Insufficient documentation

## 2022-06-28 DIAGNOSIS — M11261 Other chondrocalcinosis, right knee: Secondary | ICD-10-CM | POA: Diagnosis not present

## 2022-06-28 DIAGNOSIS — M25561 Pain in right knee: Secondary | ICD-10-CM | POA: Diagnosis not present

## 2022-06-28 DIAGNOSIS — M13861 Other specified arthritis, right knee: Secondary | ICD-10-CM | POA: Diagnosis not present

## 2022-06-28 DIAGNOSIS — M25461 Effusion, right knee: Secondary | ICD-10-CM | POA: Diagnosis not present

## 2022-07-03 ENCOUNTER — Ambulatory Visit (INDEPENDENT_AMBULATORY_CARE_PROVIDER_SITE_OTHER): Payer: PPO

## 2022-07-03 DIAGNOSIS — E538 Deficiency of other specified B group vitamins: Secondary | ICD-10-CM | POA: Diagnosis not present

## 2022-07-03 MED ORDER — CYANOCOBALAMIN 1000 MCG/ML IJ SOLN
1000.0000 ug | Freq: Once | INTRAMUSCULAR | Status: AC
  Administered 2022-07-03: 1000 ug via INTRAMUSCULAR

## 2022-07-03 NOTE — Progress Notes (Signed)
Pt arrived for B12 injection, given in R deltoid. Pt tolerated injection well, showed no signs of distress nor voiced any concerns.  

## 2022-07-18 ENCOUNTER — Encounter: Payer: Self-pay | Admitting: Internal Medicine

## 2022-07-18 ENCOUNTER — Ambulatory Visit: Payer: PPO | Admitting: Pulmonary Disease

## 2022-07-18 ENCOUNTER — Ambulatory Visit (INDEPENDENT_AMBULATORY_CARE_PROVIDER_SITE_OTHER): Payer: PPO | Admitting: Internal Medicine

## 2022-07-18 ENCOUNTER — Encounter: Payer: Self-pay | Admitting: Pulmonary Disease

## 2022-07-18 VITALS — BP 120/62 | HR 64 | Temp 98.0°F | Ht 62.0 in | Wt 128.2 lb

## 2022-07-18 VITALS — BP 120/70 | HR 69 | Temp 98.1°F | Ht 63.0 in | Wt 129.8 lb

## 2022-07-18 DIAGNOSIS — J45991 Cough variant asthma: Secondary | ICD-10-CM | POA: Diagnosis not present

## 2022-07-18 DIAGNOSIS — R7301 Impaired fasting glucose: Secondary | ICD-10-CM | POA: Diagnosis not present

## 2022-07-18 DIAGNOSIS — R944 Abnormal results of kidney function studies: Secondary | ICD-10-CM | POA: Diagnosis not present

## 2022-07-18 DIAGNOSIS — I700 Atherosclerosis of aorta: Secondary | ICD-10-CM

## 2022-07-18 DIAGNOSIS — R053 Chronic cough: Secondary | ICD-10-CM | POA: Diagnosis not present

## 2022-07-18 DIAGNOSIS — J3089 Other allergic rhinitis: Secondary | ICD-10-CM | POA: Diagnosis not present

## 2022-07-18 DIAGNOSIS — M8580 Other specified disorders of bone density and structure, unspecified site: Secondary | ICD-10-CM | POA: Diagnosis not present

## 2022-07-18 DIAGNOSIS — Z780 Asymptomatic menopausal state: Secondary | ICD-10-CM | POA: Diagnosis not present

## 2022-07-18 DIAGNOSIS — J302 Other seasonal allergic rhinitis: Secondary | ICD-10-CM

## 2022-07-18 DIAGNOSIS — K219 Gastro-esophageal reflux disease without esophagitis: Secondary | ICD-10-CM | POA: Diagnosis not present

## 2022-07-18 DIAGNOSIS — E785 Hyperlipidemia, unspecified: Secondary | ICD-10-CM

## 2022-07-18 DIAGNOSIS — Z79899 Other long term (current) drug therapy: Secondary | ICD-10-CM

## 2022-07-18 DIAGNOSIS — Z0000 Encounter for general adult medical examination without abnormal findings: Secondary | ICD-10-CM

## 2022-07-18 LAB — COMPREHENSIVE METABOLIC PANEL
ALT: 11 U/L (ref 0–35)
AST: 12 U/L (ref 0–37)
Albumin: 4.1 g/dL (ref 3.5–5.2)
Alkaline Phosphatase: 67 U/L (ref 39–117)
BUN: 15 mg/dL (ref 6–23)
CO2: 29 mEq/L (ref 19–32)
Calcium: 9.3 mg/dL (ref 8.4–10.5)
Chloride: 105 mEq/L (ref 96–112)
Creatinine, Ser: 0.93 mg/dL (ref 0.40–1.20)
GFR: 60.25 mL/min (ref >60.00)
Glucose, Bld: 90 mg/dL (ref 70–99)
Potassium: 4.4 mEq/L (ref 3.5–5.1)
Sodium: 143 mEq/L (ref 135–145)
Total Bilirubin: 0.4 mg/dL (ref 0.2–1.2)
Total Protein: 6.6 g/dL (ref 6.0–8.3)

## 2022-07-18 LAB — CBC WITH DIFFERENTIAL/PLATELET
Basophils Absolute: 0 10*3/uL (ref 0.0–0.1)
Basophils Relative: 0.4 % (ref 0.0–3.0)
Eosinophils Absolute: 0.1 10*3/uL (ref 0.0–0.7)
Eosinophils Relative: 1.8 % (ref 0.0–5.0)
HCT: 39.2 % (ref 36.0–46.0)
Hemoglobin: 12.8 g/dL (ref 12.0–15.0)
Lymphocytes Relative: 18.8 % (ref 12.0–46.0)
Lymphs Abs: 1.2 10*3/uL (ref 0.7–4.0)
MCHC: 32.7 g/dL (ref 30.0–36.0)
MCV: 88.1 fl (ref 78.0–100.0)
Monocytes Absolute: 0.4 10*3/uL (ref 0.1–1.0)
Monocytes Relative: 7.2 % (ref 3.0–12.0)
Neutro Abs: 4.4 10*3/uL (ref 1.4–7.7)
Neutrophils Relative %: 71.8 % (ref 43.0–77.0)
Platelets: 178 10*3/uL (ref 150.0–400.0)
RBC: 4.45 Mil/uL (ref 3.87–5.11)
RDW: 15.4 % (ref 11.5–15.5)
WBC: 6.1 10*3/uL (ref 4.0–10.5)

## 2022-07-18 LAB — LIPID PANEL
Cholesterol: 166 mg/dL (ref 0–200)
HDL: 81 mg/dL (ref >39.00)
LDL Cholesterol: 72 mg/dL (ref 0–99)
NonHDL: 85.25
Total CHOL/HDL Ratio: 2
Triglycerides: 67 mg/dL (ref 0.0–149.0)
VLDL: 13.4 mg/dL (ref 0.0–40.0)

## 2022-07-18 LAB — LDL CHOLESTEROL, DIRECT: Direct LDL: 71 mg/dL

## 2022-07-18 LAB — TSH: TSH: 3.37 u[IU]/mL (ref 0.35–5.50)

## 2022-07-18 LAB — HEMOGLOBIN A1C: Hgb A1c MFr Bld: 6.1 % (ref 4.6–6.5)

## 2022-07-18 NOTE — Progress Notes (Signed)
Patient ID: Nancy Hamilton, female    DOB: Nov 23, 1945  Age: 76 y.o. MRN: [id number]  The patient is here for annual  preventive examination and management of other chronic and acute problems.   The risk factors are reflected in the social history.  The roster of all physicians providing medical care to patient - is listed in the Snapshot section of the chart.  Activities of daily living:  The patient is 100% independent in all ADLs: dressing, toileting, feeding as well as independent mobility  Home safety : The patient has smoke detectors in the home. They wear seatbelts.  There are no firearms at home. There is no violence in the home.   There is no risks for hepatitis, STDs or HIV. There is no   history of blood transfusion. They have no travel history to infectious disease endemic areas of the world.  The patient has seen their dentist in the last six month. They have seen their eye doctor in the last year. They admit to slight hearing difficulty with regard to whispered voices and some television programs.  They have deferred audiologic testing in the last year.  They do not  have excessive sun exposure. Discussed the need for sun protection: hats, long sleeves and use of sunscreen if there is significant sun exposure.   Diet: the importance of a healthy diet is discussed. They do have a healthy diet. Eats out ,  has salad daily,  salmon once a week avoids fried food .    The benefits of regular aerobic exercise were discussed. She walks  7 days  per week, 60 minutes.   Depression screen: there are no signs or vegative symptoms of depression- irritability, change in appetite, anhedonia, sadness/tearfullness.  Cognitive assessment: the patient manages all their financial and personal affairs and is actively engaged. They could relate day,date,year and events; recalled 2/3 objects at 3 minutes; performed clock-face test normally.  The following portions of the patient's history were  reviewed and updated as appropriate: allergies, current medications, past family history, past medical history,  past surgical history, past social history  and problem list.  Visual acuity was not assessed per patient preference since she has regular follow up with her ophthalmologist. Hearing and body mass index were assessed and reviewed.   During the course of the visit the patient was educated and counseled about appropriate screening and preventive services including : fall prevention , diabetes screening, nutrition counseling, colorectal cancer screening, and recommended immunizations.    CC: The primary encounter diagnosis was Hyperlipidemia LDL goal <100. Diagnoses of Decreased GFR, Long-term use of high-risk medication, Impaired fasting glucose, Osteopenia after menopause, Thoracic aortic atherosclerosis ([place]), and Encounter for preventive health examination were also pertinent to this visit.  1) right medial knee pain after walking.  [facility]ent to [facility] and had x rays and steroid injection .  Has   OA.  No loss of joint space   2) cough iresolved with daily use  of singulair and  symbicort  MDI  twice daily . PFTS repeated and NORMAL.   3) taking 2000 Ius vitamin D and 1000 mg vitamin C  4) h/o depression:  tried to wean off prozac.  Relapsed after one week suspension,  had a melt down felt that the stress of selling her home and moving  to a smaller place aggravated it.   History Xiana has a past medical history of Allergy, Anemia, Anxiety, Arthritis, Cancer ([place]), Cough, Depression, GERD (gastroesophageal reflux  disease), and Hyperlipidemia.   She has a past surgical history that includes Back surgery (1980); Cataract extraction w/PHACO (Right, 11/28/2015); Colonoscopy; Cataract extraction w/PHACO (Left, 12/19/2015); Colonoscopy with propofol (N/A, 10/10/2017); and polypectomy (10/10/2017).   Her family history includes Breast cancer (age of onset: 48) in her maternal aunt;  Diabetes in her brother and paternal grandmother; Fibromyalgia in her sister; Heart disease in her brother; Hyperlipidemia in her sister; Hypertension in her brother.She reports that she quit smoking about 29 years ago. Her smoking use included cigarettes. She has a 11.25 pack-year smoking history. She has never used smokeless tobacco. She reports that she does not drink alcohol and does not use drugs.  Outpatient Medications Prior to Visit  Medication Sig Dispense Refill   albuterol (VENTOLIN HFA) 108 (90 Base) MCG/ACT inhaler Inhale 2 puffs into the lungs every 6 (six) hours as needed for wheezing or shortness of breath (cough). 18 g 2   Ascorbic Acid (VITAMIN C) 1000 MG tablet Take 1,000 mg by mouth daily.     Cholecalciferol 50 MCG (2000 UT) CAPS Take 1 capsule by mouth daily.     cyanocobalamin (,VITAMIN B-12,) 1000 MCG/ML injection Inject 1 ml intrumuscularly daily x 3, then weekly x 4, then monthly thereafter 10 mL 2   FLUoxetine (PROZAC) 20 MG capsule TAKE 1 CAPSULE BY MOUTH EVERY DAY 90 capsule 1   fluticasone (FLONASE) 50 MCG/ACT nasal spray PLACE TWO SPRAYS INTO BOTH NOSTRILS DAILY AS DIRECTED 48 g 1   hydrocortisone 2.5 % ointment Apply topically.     levocetirizine (XYZAL) 5 MG tablet TAKE ONE TABLET BY MOUTH EVERY EVEING ASDIRECTED 90 tablet 1   meclizine (ANTIVERT) 12.5 MG tablet Take 1 tablet (12.5 mg total) by mouth 3 (three) times daily as needed for dizziness. 30 tablet 0   montelukast (SINGULAIR) 10 MG tablet Take 1 tablet (10 mg total) by mouth daily. 90 tablet 3   pantoprazole (PROTONIX) 40 MG tablet TAKE 1 TABLET(40 MG) BY MOUTH TWICE DAILY 180 tablet 1   rosuvastatin (CRESTOR) 5 MG tablet TAKE 1 TABLET(5 MG) BY MOUTH DAILY 90 tablet 3   Spacer/Aero-Holding Chambers (AEROCHAMBER MV) inhaler Use as instructed 1 each 0   SYMBICORT 160-4.5 MCG/ACT inhaler INHALE TWO PUFFS TWICE A DAY 30.6 g 1   Syringe, Disposable, 1 ML MISC For use with B12 injections 25 each 1   predniSONE  (DELTASONE) 10 MG tablet 6 tablets on Day 1 , then reduce by 1 tablet daily until gone (Patient not taking: Reported on 07/18/2022) 21 tablet 0   No facility-administered medications prior to visit.    Review of Systems  Patient denies headache, fevers, malaise, unintentional weight loss, skin rash, eye pain, sinus congestion and sinus pain, sore throat, dysphagia,  hemoptysis , cough, dyspnea, wheezing, chest pain, palpitations, orthopnea, edema, abdominal pain, nausea, melena, diarrhea, constipation, flank pain, dysuria, hematuria, urinary  Frequency, nocturia, numbness, tingling, seizures,  Focal weakness, Loss of consciousness,  Tremor, insomnia, depression, anxiety, and suicidal ideation.     Objective:  BP 120/62 (BP Location: Left Arm, Patient Position: Sitting, Cuff Size: Normal)   Pulse 64   Temp 98 F (36.7 C) (Oral)   Ht _0  (1.575 m)   Wt 128 lb 3.2 oz (58.2 kg)   SpO2 96%   BMI 23.45 kg/m   Physical Exam   General appearance: alert, cooperative and appears stated age Head: Normocephalic, without obvious abnormality, atraumatic Eyes: conjunctivae/corneas clear. PERRL, EOM's intact. Fundi benign. Ears: normal TM's and  external ear canals both ears Nose: Nares normal. Septum midline. Mucosa normal. No drainage or sinus tenderness. Throat: lips, mucosa, and tongue normal; teeth and gums normal Neck: no adenopathy, no carotid bruit, no JVD, supple, symmetrical, trachea midline and thyroid not enlarged, symmetric, no tenderness/mass/nodules Lungs: clear to auscultation bilaterally Breasts: normal appearance, no masses or tenderness Heart: regular rate and rhythm, S1, S2 normal, no murmur, click, rub or gallop Abdomen: soft, non-tender; bowel sounds normal; no masses,  no organomegaly Extremities: extremities normal, atraumatic, no cyanosis or edema Pulses: 2+ and symmetric Skin: Skin color, texture, turgor normal. No rashes or lesions Neurologic: Alert and oriented X 3,  normal strength and tone. Normal symmetric reflexes. Normal coordination and gait.    Assessment & Plan:   Problem List Items Addressed This Visit     Encounter for preventive health examination    age appropriate education and counseling updated, referrals for preventative services and immunizations addressed, dietary and smoking counseling addressed, most recent labs reviewed.  I have personally reviewed and have noted:   1) the patient's medical and social history 2) The pt's use of alcohol, tobacco, and illicit drugs 3) The patient's current medications and supplements 4) Functional ability including ADL's, fall risk, home safety risk, hearing and visual impairment 5) Diet and physical activities 6) Evidence for depression or mood disorder 7) The patient's height, weight, and BMI have been recorded in the chart   I have made referrals, and provided counseling and education based on review of the above      Hyperlipidemia LDL goal <100 - Primary   Relevant Orders   Lipid Profile (Completed)   Direct LDL (Completed)   Thoracic aortic atherosclerosis ([place])    Noted on chest x ray. And reviewed with patient today.  She is tolerating rosuvastatin as preventive ; LDL now < 100  Lab Results  Component Value Date   CHOL 166 07/18/2022   HDL 81.00 07/18/2022   LDLCALC 72 07/18/2022   LDLDIRECT 71.0 07/18/2022   TRIG 67.0 07/18/2022   CHOLHDL 2 07/18/2022         Other Visit Diagnoses     Decreased GFR       Long-term use of high-risk medication       Relevant Orders   Comp Met (CMET) (Completed)   TSH (Completed)   CBC with Differential/Platelet (Completed)   Impaired fasting glucose       Relevant Orders   HgB A1c (Completed)   Osteopenia after menopause       Relevant Orders   DG Bone Density       Medications Discontinued During This Encounter  Medication Reason   predniSONE (DELTASONE) 10 MG tablet     Follow-up: No follow-ups on file.   Crecencio Mc,  MD

## 2022-07-18 NOTE — Patient Instructions (Addendum)
Wait  until after the holidays until you try to wean off your Prozac   Your bone density test  been ordered.  Please call to make your appointment at [facility] [phone]    Do your leg extensions SLOWLY

## 2022-07-18 NOTE — Progress Notes (Signed)
Subjective:    Patient ID: Nancy Hamilton, female    DOB: 04-18-1946, 76 y.o.   MRN: [id number] Patient Care Team: Crecencio Mc, MD as PCP - General (Internal Medicine)  Chief Complaint  Patient presents with   Follow-up   HPI This is a 76 year old remote former smoker (quit 1993) who presents for follow-up on the issue of cough variant asthma with a component of LPR trigger.  She was last seen here 21 December 2021, at that time we started her on Singulair due to nasal symptoms and postnasal drip adding to her cough trigger.  She has had no new issues in the interim.  This is a scheduled visit.  She notes that since adding the Singulair she has not had any issues with cough and she has no dyspnea.  She had a positive methacholine test May 2018. Remote smoking history spanned only 15 pack years.  No occupational exposures.  She continues to be compliant with Symbicort twice a da.   Compliant with PPI and antireflux measures.  LPR/GERD is another trigger for her.  She had PFTs performed on 14 February 2021 and these were normal across the board.  She has not had any fevers, chills or sweats.  No upper respiratory infections of late.  Doing well with Symbicort.  Requests a new spacer.  Overall she feels well and looks well.   Review of Systems A 10 point review of systems was performed and it is as noted above otherwise negative.  Patient Active Problem List   Diagnosis Date Noted   Sinusitis, acute frontal 01/22/2022   Cerumen impaction 07/12/2021   Thoracic aortic atherosclerosis ([place]) 12/21/2018   Stress incontinence 03/22/2018   Onychomycosis 03/22/2018   Benign paroxysmal positional vertigo of right ear 11/14/2016   Cervical disc disorder with radiculopathy of cervical region 03/09/2016   Shoulder pain, right 03/09/2016   Statin intolerance 06/08/2014   B12 deficiency 06/07/2014   Iron deficiency 06/07/2014   Adenomatous polyps 02/28/2014   Generalized anxiety disorder  12/01/2012   GERD (gastroesophageal reflux disease) 12/01/2012   Asthma in adult, mild persistent, uncomplicated 75/98/2641, uncomplicated 54/98/2641   Hyperlipidemia LDL goal <100 12/01/2012   Social History   Tobacco Use   Smoking status: Former    Packs/day: 0.75    Years: 15.00    Total pack years: 11.25    Types: Cigarettes    Quit date: 08/10/1992    Years since quitting: 29.9   Smokeless tobacco: Never  Substance Use Topics   Alcohol use: No   Allergies  Allergen Reactions   Tylox [Oxycodone-Acetaminophen] Rash   Current Meds  Medication Sig   albuterol (VENTOLIN HFA) 108 (90 Base) MCG/ACT inhaler Inhale 2 puffs into the lungs every 6 (six) hours as needed for wheezing or shortness of breath (cough).   Ascorbic Acid (VITAMIN C) 1000 MG tablet Take 1,000 mg by mouth daily.   Cholecalciferol 50 MCG (2000 UT) CAPS Take 1 capsule by mouth daily.   cyanocobalamin (,VITAMIN B-12,) 1000 MCG/ML injection Inject 1 ml intrumuscularly daily x 3, then weekly x 4, then monthly thereafter   FLUoxetine (PROZAC) 20 MG capsule TAKE 1 CAPSULE BY MOUTH EVERY DAY   fluticasone (FLONASE) 50 MCG/ACT nasal spray PLACE TWO SPRAYS INTO BOTH NOSTRILS DAILY AS DIRECTED   hydrocortisone 2.5 % ointment Apply topically.   levocetirizine (XYZAL) 5 MG tablet TAKE ONE TABLET BY MOUTH EVERY EVEING ASDIRECTED   meclizine (ANTIVERT) 12.5 MG tablet Take 1 tablet (12.5 mg total)  by mouth 3 (three) times daily as needed for dizziness.   montelukast (SINGULAIR) 10 MG tablet Take 1 tablet (10 mg total) by mouth daily.   pantoprazole (PROTONIX) 40 MG tablet TAKE 1 TABLET(40 MG) BY MOUTH TWICE DAILY   rosuvastatin (CRESTOR) 5 MG tablet TAKE 1 TABLET(5 MG) BY MOUTH DAILY   Spacer/Aero-Holding Chambers (AEROCHAMBER MV) inhaler Use as instructed   SYMBICORT 160-4.5 MCG/ACT inhaler INHALE TWO PUFFS TWICE A DAY   Syringe, Disposable, 1 ML MISC For use with B12 injections   Immunization History  Administered Date(s) Administered   Fluad  Quad(high Dose 65+) 08/25/2019, 09/06/2020, 08/22/2021   Influenza Split 08/10/2012   Influenza, High Dose Seasonal PF 09/14/2016, 09/03/2017, 08/19/2018   Influenza,inj,Quad PF,6+ Mos 08/20/2013, 08/26/2014, 09/06/2015   Moderna Sars-Covid-2 Vaccination 12/22/2019, 01/19/2020, 09/26/2020   Pneumococcal Conjugate-13 02/25/2014   Pneumococcal Polysaccharide-23 02/18/2013, 06/22/2020   Tdap 12/01/2010, 07/18/2021   Zoster Recombinat (Shingrix) 10/23/2019, 02/10/2020   Zoster, Live 10/20/2015       Objective:   Physical Exam BP 120/70 (BP Location: Right Arm, Patient Position: Sitting, Cuff Size: Large)   Pulse 69   Temp 98.1 F (36.7 C) (Oral)   Ht '5\' 3"'$  (1.6 m)   Wt 129 lb 12.8 oz (58.9 kg)   SpO2 98%   BMI 22.99 kg/m  GENERAL: Well-developed well-nourished, fit appearing, fully ambulatory, no acute distress. HEAD: Normocephalic, atraumatic.  EYES: Pupils equal, round, reactive to light.  No scleral icterus.  MOUTH: Nose/mouth/throat not examined due to masking requirements for COVID 19. NECK: Supple. No thyromegaly. Trachea midline. No JVD.  No adenopathy. PULMONARY: Lungs clear to auscultation bilaterally.  Good air entry bilaterally. CARDIOVASCULAR: S1 and S2. Regular rate and rhythm.  No rubs, murmurs or gallops heard. GASTROINTESTINAL: No abdominal distention noted. MUSCULOSKELETAL: No joint deformity, no clubbing, no edema.  NEUROLOGIC: Awake, alert, fully oriented.  No focal deficit noted.  No gait disturbance noted. SKIN: Intact,warm,dry.  On limited exam no rashes. PSYCH: Mood and behavior appropriate.    Assessment & Plan:     ICD-10-CM   1. Cough variant asthma  J45.991    Well-controlled with Symbicort and Singulair Continue same Follow-up 6 months    2. Laryngopharyngeal reflux (LPR)  K21.9    Compliant with antireflux measures Compliant with PPI Well-controlled    3. Perennial allergic rhinitis with seasonal variation  J30.89    J30.2    Symptoms  controlled with Singulair    4. Chronic cough  R05.3    RESOLVED      Patient is doing well.  We will see her in follow-up in 6 months time she is to contact us prior to that time should any new difficulties arise.  Renold Don, MD Advanced Bronchoscopy [facility]    *This note was dictated using voice recognition software/Dragon.  Despite best efforts to proofread, errors can occur which can change the meaning. Any transcriptional errors that result from this process are unintentional and may not be fully corrected at the time of dictation.

## 2022-07-18 NOTE — Patient Instructions (Signed)
Continue Symbicort and Singulair  We will see him in follow-up in 6 months time call sooner should any problems arise

## 2022-07-19 NOTE — Assessment & Plan Note (Signed)

## 2022-07-19 NOTE — Assessment & Plan Note (Signed)
Noted on chest x ray. And reviewed with patient today.  She is tolerating rosuvastatin as preventive ; LDL now < 100  Lab Results  Component Value Date   CHOL 166 07/18/2022   HDL 81.00 07/18/2022   LDLCALC 72 07/18/2022   LDLDIRECT 71.0 07/18/2022   TRIG 67.0 07/18/2022   CHOLHDL 2 07/18/2022

## 2022-07-20 ENCOUNTER — Ambulatory Visit: Payer: PPO | Admitting: Pulmonary Disease

## 2022-07-23 ENCOUNTER — Telehealth: Payer: Self-pay

## 2022-07-23 NOTE — Telephone Encounter (Signed)
LMTCB for lab results.  

## 2022-07-30 DIAGNOSIS — M11261 Other chondrocalcinosis, right knee: Secondary | ICD-10-CM | POA: Diagnosis not present

## 2022-07-30 DIAGNOSIS — M1711 Unilateral primary osteoarthritis, right knee: Secondary | ICD-10-CM | POA: Diagnosis not present

## 2022-08-07 ENCOUNTER — Ambulatory Visit (INDEPENDENT_AMBULATORY_CARE_PROVIDER_SITE_OTHER): Payer: PPO | Admitting: *Deleted

## 2022-08-07 DIAGNOSIS — E538 Deficiency of other specified B group vitamins: Secondary | ICD-10-CM

## 2022-08-07 MED ORDER — CYANOCOBALAMIN 1000 MCG/ML IJ SOLN
1000.0000 ug | Freq: Once | INTRAMUSCULAR | Status: AC
  Administered 2022-08-07: 1000 ug via INTRAMUSCULAR

## 2022-08-07 NOTE — Progress Notes (Signed)
Patient presented for B 12 injection to left deltoid, patient voiced no concerns nor showed any signs of distress during injection. 

## 2022-08-23 ENCOUNTER — Other Ambulatory Visit: Payer: Self-pay | Admitting: Internal Medicine

## 2022-08-29 ENCOUNTER — Ambulatory Visit
Admission: RE | Admit: 2022-08-29 | Discharge: 2022-08-29 | Disposition: A | Discharge status: Home / Self Care | Payer: PPO | Source: Ambulatory Visit | Attending: Internal Medicine | Admitting: Internal Medicine

## 2022-08-29 DIAGNOSIS — M8580 Other specified disorders of bone density and structure, unspecified site: Secondary | ICD-10-CM | POA: Diagnosis not present

## 2022-08-29 DIAGNOSIS — Z780 Asymptomatic menopausal state: Secondary | ICD-10-CM | POA: Diagnosis not present

## 2022-08-29 DIAGNOSIS — M8589 Other specified disorders of bone density and structure, multiple sites: Secondary | ICD-10-CM | POA: Diagnosis not present

## 2022-09-03 DIAGNOSIS — D2261 Melanocytic nevi of right upper limb, including shoulder: Secondary | ICD-10-CM | POA: Diagnosis not present

## 2022-09-03 DIAGNOSIS — Z85828 Personal history of other malignant neoplasm of skin: Secondary | ICD-10-CM | POA: Diagnosis not present

## 2022-09-03 DIAGNOSIS — D485 Neoplasm of uncertain behavior of skin: Secondary | ICD-10-CM | POA: Diagnosis not present

## 2022-09-03 DIAGNOSIS — D0461 Carcinoma in situ of skin of right upper limb, including shoulder: Secondary | ICD-10-CM | POA: Diagnosis not present

## 2022-09-03 DIAGNOSIS — D2271 Melanocytic nevi of right lower limb, including hip: Secondary | ICD-10-CM | POA: Diagnosis not present

## 2022-09-03 DIAGNOSIS — D0462 Carcinoma in situ of skin of left upper limb, including shoulder: Secondary | ICD-10-CM | POA: Diagnosis not present

## 2022-09-03 DIAGNOSIS — D2272 Melanocytic nevi of left lower limb, including hip: Secondary | ICD-10-CM | POA: Diagnosis not present

## 2022-09-03 DIAGNOSIS — L570 Actinic keratosis: Secondary | ICD-10-CM | POA: Diagnosis not present

## 2022-09-06 ENCOUNTER — Telehealth: Payer: Self-pay

## 2022-09-06 NOTE — Telephone Encounter (Signed)
LMTCB to call back in regards to bone density scan results.

## 2022-09-06 NOTE — Telephone Encounter (Signed)
-----   Message from Crecencio Mc, MD sent at 09/01/2022 10:41 PM EDT ----- You have moderate osteopenia by recent bone density test.  Your  T Score was  -2.0 , so  Your  risk of fracture in the next  10 yrs is about 15% .  I recommend weight bearing exercise, 1200 mg calcium through diet /supplements and 1000 units Vit D daily  And a Repeat DEXA in 2 years .  I recommend getting the majority of your calcium and Vitamin D  through diet rather than supplements given the recent association of calcium supplements with increased coronary artery calcium scores (You need 1200 mg daily of calcium and 1000 units of D3 unless you have been told previously that you are Vitamin D deficient ) .  Unsweetened almond/coconut milk  And unsweetened soy milk are excellent nondairy sources of calcium that are also low carb and cholesterol free

## 2022-09-11 ENCOUNTER — Ambulatory Visit (INDEPENDENT_AMBULATORY_CARE_PROVIDER_SITE_OTHER): Payer: PPO

## 2022-09-11 DIAGNOSIS — E538 Deficiency of other specified B group vitamins: Secondary | ICD-10-CM

## 2022-09-11 DIAGNOSIS — Z23 Encounter for immunization: Secondary | ICD-10-CM | POA: Diagnosis not present

## 2022-09-11 MED ORDER — CYANOCOBALAMIN 1000 MCG/ML IJ SOLN
1000.0000 ug | Freq: Once | INTRAMUSCULAR | Status: AC
  Administered 2022-09-11: 1000 ug via INTRAMUSCULAR

## 2022-09-11 NOTE — Progress Notes (Signed)
Pt arrived for both B12 & flu shot today in ofc, per pt's request B12 given in R deltoid & flu shot given in L deltoid. Pt tolerated injections well, showed no signs of distress nor voiced any concerns.

## 2022-09-17 ENCOUNTER — Telehealth: Payer: Self-pay | Admitting: *Deleted

## 2022-09-17 MED ORDER — NA SULFATE-K SULFATE-MG SULF 17.5-3.13-1.6 GM/177ML PO SOLN: 1.0000 | Freq: Once | ORAL | 0 refills | Status: AC

## 2022-09-17 NOTE — Telephone Encounter (Signed)
Patient called office and stating that she needs her Rx for prep solution to be sent to the pharmacy.  After looking into patient's chart, it showed that Suprep solution needs to be sent to [facility] pharmacy for patient.   I have send it to pharmacy as requested. We went the directions as well.

## 2022-09-21 ENCOUNTER — Encounter: Payer: Self-pay | Admitting: Gastroenterology

## 2022-09-23 ENCOUNTER — Ambulatory Visit
Admission: EM | Admit: 2022-09-23 | Discharge: 2022-09-23 | Disposition: A | Discharge status: Home / Self Care | Payer: PPO

## 2022-09-23 DIAGNOSIS — J329 Chronic sinusitis, unspecified: Secondary | ICD-10-CM

## 2022-09-23 MED ORDER — AZITHROMYCIN 250 MG PO TABS: ORAL_TABLET | ORAL | 0 refills | Status: DC

## 2022-09-23 NOTE — ED Provider Notes (Signed)
Roderic Palau    CSN: [id number] Arrival date & time: 09/23/22  1200      History   Chief Complaint Chief Complaint  Patient presents with   nasal drainage    Cough    HPI Nancy Hamilton is a 76 y.o. female.    Cough   Presents to UC with complaint of nasal drainage and cough x3 days.  Expresses concern for sinus infection.  Denies fever, myalgias, chills.  Has colonoscopy and EGD scheduled for Friday and is concerned about having to reschedule this procedure if her symptoms are not resolved.  She is most concerned about her cough which is productive of yellow mucus.  Past Medical History:  Diagnosis Date   Allergy    Anemia    Anxiety    Arthritis    neck, no limitations   Cancer (HCC)    SKIN   Cough    CHRONIC FROM REFLUX   Depression    GERD (gastroesophageal reflux disease)    SLEEPS WITH HOB ELEVATED   Hyperlipidemia     Patient Active Problem List   Diagnosis Date Noted   Arthritis of right knee 06/28/2022   Swelling of knee joint 06/28/2022   Sinusitis, acute frontal 01/22/2022   Cerumen impaction 07/12/2021   Thoracic aortic atherosclerosis ([place]) 12/21/2018   Stress incontinence 03/22/2018   Onychomycosis 03/22/2018   Benign paroxysmal positional vertigo of right ear 11/14/2016   Cervical disc disorder with radiculopathy of cervical region 03/09/2016   Shoulder pain, right 03/09/2016   Statin intolerance 06/08/2014   B12 deficiency 06/07/2014   Iron deficiency 06/07/2014   Adenomatous polyps 02/28/2014   Encounter for preventive health examination 02/20/2013   Generalized anxiety disorder 12/01/2012   GERD (gastroesophageal reflux disease) 12/01/2012   Asthma in adult, mild persistent, uncomplicated 82/80/0349   Hyperlipidemia LDL goal <100 12/01/2012    Past Surgical History:  Procedure Laterality Date   BACK SURGERY  1980   Ruptured Disc   CATARACT EXTRACTION W/PHACO Right 11/28/2015   Procedure: CATARACT EXTRACTION PHACO  AND INTRAOCULAR LENS PLACEMENT ([place]);  Surgeon: Estill Cotta, MD;  Location: [facility];  Service: Ophthalmology;  Laterality: Right;  [place] AP% CDE fluid pack lot # [id number]   CATARACT EXTRACTION W/PHACO Left 12/19/2015   Procedure: CATARACT EXTRACTION PHACO AND INTRAOCULAR LENS PLACEMENT ([facility]);  Surgeon: Estill Cotta, MD;  Location: [facility];  Service: Ophthalmology;  Laterality: Left;  US:1:08.2 AP%:56.7% CDE:28.10 Lot# [id number]   COLONOSCOPY     COLONOSCOPY WITH PROPOFOL N/A 10/10/2017   Procedure: COLONOSCOPY WITH PROPOFOL;  Surgeon: Lucilla Lame, MD;  Location: [place];  Service: Endoscopy;  Laterality: N/A;   POLYPECTOMY  10/10/2017   Procedure: POLYPECTOMY;  Surgeon: Lucilla Lame, MD;  Location: [place];  Service: Endoscopy;;    OB History   No obstetric history on file.      Home Medications    Prior to Admission medications   Medication Sig Start Date End Date Taking? Authorizing Provider  Na Sulfate-K Sulfate-Mg Sulf 17.5-3.13-1.6 GM/177ML SOLN Take by mouth as directed. 09/17/22  Yes [provider]  albuterol (VENTOLIN HFA) 108 (90 Base) MCG/ACT inhaler Inhale 2 puffs into the lungs every 6 (six) hours as needed for wheezing or shortness of breath (cough). 05/10/20   Tyler Pita, MD  Ascorbic Acid (VITAMIN C) 1000 MG tablet Take 1,000 mg by mouth daily. Patient not taking: Reported on 09/21/2022    [provider]  Cholecalciferol 50 MCG (2000  UT) CAPS Take 1 capsule by mouth daily.    [provider]  cyanocobalamin (,VITAMIN B-12,) 1000 MCG/ML injection Inject 1 ml intrumuscularly daily x 3, then weekly x 4, then monthly thereafter Patient not taking: Reported on 09/21/2022 06/08/14   Crecencio Mc, MD  FLUoxetine (PROZAC) 20 MG capsule TAKE 1 CAPSULE BY MOUTH EVERY DAY Patient not taking: Reported on 09/21/2022 03/07/22   Crecencio Mc, MD  fluticasone (FLONASE) 50 MCG/ACT nasal spray PLACE TWO SPRAYS INTO  BOTH NOSTRILS DAILY AS DIRECTED 06/11/22   Crecencio Mc, MD  hydrocortisone 2.5 % ointment Apply topically. Patient not taking: Reported on 09/21/2022 11/14/21   [provider]  levocetirizine (XYZAL) 5 MG tablet TAKE ONE TABLET BY MOUTH EVERY EVEING ASDIRECTED 06/01/22   Crecencio Mc, MD  meclizine (ANTIVERT) 12.5 MG tablet Take 1 tablet (12.5 mg total) by mouth 3 (three) times daily as needed for dizziness. Patient not taking: Reported on 09/21/2022 11/14/16   Burnard Hawthorne, FNP  montelukast (SINGULAIR) 10 MG tablet Take 1 tablet (10 mg total) by mouth daily. 04/25/22   Tyler Pita, MD  pantoprazole (PROTONIX) 40 MG tablet TAKE 1 TABLET(40 MG) BY MOUTH TWICE DAILY 08/10/21   Crecencio Mc, MD  rosuvastatin (CRESTOR) 5 MG tablet TAKE 1 TABLET BY MOUTH DAILY 08/23/22   Crecencio Mc, MD  Spacer/Aero-Holding Chambers (AEROCHAMBER MV) inhaler Use as instructed 11/10/19   Tyler Pita, MD  SYMBICORT 160-4.5 MCG/ACT inhaler INHALE TWO PUFFS TWICE A DAY 05/02/22   Tyler Pita, MD  Syringe, Disposable, 1 ML MISC For use with B12 injections Patient not taking: Reported on 09/21/2022 06/08/14   Crecencio Mc, MD    Family History Family History  Problem Relation Age of Onset   Hyperlipidemia Sister    Fibromyalgia Sister    Heart disease Brother    Diabetes Brother    Hypertension Brother    Breast cancer Maternal Aunt 75   Diabetes Paternal Grandmother     Social History Social History   Tobacco Use   Smoking status: Former    Packs/day: 0.75    Years: 15.00    Total pack years: 11.25    Types: Cigarettes    Quit date: 08/10/1992    Years since quitting: 30.1   Smokeless tobacco: Never  Vaping Use   Vaping Use: Never used  Substance Use Topics   Alcohol use: No   Drug use: No     Allergies   Tylox [oxycodone-acetaminophen]   Review of Systems Review of Systems  Respiratory:  Positive for cough.      Physical Exam Triage Vital  Signs ED Triage Vitals [09/23/22 1358]  Enc Vitals Group     BP 123/64     Pulse Rate 70     Resp 19     Temp 97.9 F (36.6 C)     Temp src      SpO2 96 %     Weight      Height      Head Circumference      Peak Flow      Pain Score 0     Pain Loc      Pain Edu?      Excl. in [place]?    No data found.  Updated Vital Signs BP 123/64   Pulse 70   Temp 97.9 F (36.6 C)   Resp 19   SpO2 96%   Visual Acuity Right Eye  Distance:   Left Eye Distance:   Bilateral Distance:    Right Eye Near:   Left Eye Near:    Bilateral Near:     Physical Exam Vitals reviewed.  Constitutional:      Appearance: Normal appearance.  Cardiovascular:     Rate and Rhythm: Normal rate and regular rhythm.     Pulses: Normal pulses.     Heart sounds: Normal heart sounds.  Pulmonary:     Effort: Pulmonary effort is normal.     Breath sounds: Normal breath sounds.  Skin:    General: Skin is warm and dry.  Neurological:     General: No focal deficit present.     Mental Status: She is alert and oriented to person, place, and time.  Psychiatric:        Mood and Affect: Mood normal.        Behavior: Behavior normal.      UC Treatments / Results  Labs (all labs ordered are listed, but only abnormal results are displayed) Labs Reviewed - No data to display  EKG   Radiology No results found.  Procedures Procedures (including critical care time)  Medications Ordered in UC Medications - No data to display  Initial Impression / Assessment and Plan / UC Course  I have reviewed the triage vital signs and the nursing notes.  Pertinent labs & imaging results that were available during my care of the patient were reviewed by me and considered in my medical decision making (see chart for details).   Patient is afebrile here without recent antipyretics. Satting well on room air. Overall is well appearing, well hydrated, without respiratory distress. Pulmonary exam is unremarkable.  Patient  endorses rhinosinusitis symptoms and insistent upon treatment with Z-Pak.  I will prescribe for its anti-inflammatory properties however warned patient that given her symptoms are likely caused by a virus, an antibiotic might be ineffective..    Final Clinical Impressions(s) / UC Diagnoses   Final diagnoses:  None   Discharge Instructions   None    ED Prescriptions   None    PDMP not reviewed this encounter.   Rose Phi, [place] 09/23/22 1430

## 2022-09-23 NOTE — Discharge Instructions (Addendum)
Follow up here or with your primary care provider if your symptoms are worsening or not improving with treatment.     

## 2022-09-23 NOTE — ED Triage Notes (Signed)
Pt. Presents to UC w/ c/o nasal drainage and a cough for the past 3 days. Pt. Expresses concern for a sinus infection.

## 2022-09-25 DIAGNOSIS — D0462 Carcinoma in situ of skin of left upper limb, including shoulder: Secondary | ICD-10-CM | POA: Diagnosis not present

## 2022-09-25 DIAGNOSIS — D0461 Carcinoma in situ of skin of right upper limb, including shoulder: Secondary | ICD-10-CM | POA: Diagnosis not present

## 2022-09-26 NOTE — Anesthesia Preprocedure Evaluation (Addendum)
Anesthesia Evaluation  Patient identified by MRN, date of birth, ID band Patient awake    Reviewed: Allergy & Precautions, NPO status , Patient's Chart, lab work & pertinent test results  History of Anesthesia Complications Negative for: history of anesthetic complications  Airway Mallampati: IV   Neck ROM: Full    Dental  (+) Implants, Chipped   Pulmonary asthma (with chronic cough) , former smoker (quit 1993)   Pulmonary exam normal breath sounds clear to auscultation       Cardiovascular Exercise Tolerance: Good negative cardio ROS Normal cardiovascular exam Rhythm:Regular Rate:Normal     Neuro/Psych  PSYCHIATRIC DISORDERS Anxiety Depression    negative neurological ROS     GI/Hepatic ,GERD  ,,  Endo/Other  negative endocrine ROS    Renal/GU negative Renal ROS     Musculoskeletal  (+) Arthritis ,    Abdominal   Peds  Hematology  (+) Blood dyscrasia, anemia   Anesthesia Other Findings   Reproductive/Obstetrics                             Anesthesia Physical Anesthesia Plan  ASA: 2  Anesthesia Plan: General   Post-op Pain Management:    Induction: Intravenous  PONV Risk Score and Plan: 3 and Propofol infusion, TIVA and Treatment may vary due to age or medical condition  Airway Management Planned: Natural Airway  Additional Equipment:   Intra-op Plan:   Post-operative Plan:   Informed Consent: I have reviewed the patients History and Physical, chart, labs and discussed the procedure including the risks, benefits and alternatives for the proposed anesthesia with the patient or authorized representative who has indicated his/her understanding and acceptance.       Plan Discussed with: CRNA  Anesthesia Plan Comments: (LMA/GETA backup discussed.  Patient consented for risks of anesthesia including but not limited to:  - adverse reactions to medications - damage to  eyes, teeth, lips or other oral mucosa - nerve damage due to positioning  - sore throat or hoarseness - damage to heart, brain, nerves, lungs, other parts of body or loss of life  Informed patient about role of CRNA in peri- and intra-operative care.  Patient voiced understanding.)       Anesthesia Quick Evaluation

## 2022-09-28 ENCOUNTER — Encounter
Admission: RE | Disposition: A | Discharge status: Home / Self Care | Payer: Self-pay | Source: Home / Self Care | Attending: Gastroenterology

## 2022-09-28 ENCOUNTER — Other Ambulatory Visit: Payer: Self-pay

## 2022-09-28 ENCOUNTER — Ambulatory Visit
Admission: RE | Admit: 2022-09-28 | Discharge: 2022-09-28 | Disposition: A | Discharge status: Home / Self Care | Payer: PPO | Attending: Gastroenterology | Admitting: Gastroenterology

## 2022-09-28 ENCOUNTER — Ambulatory Visit: Payer: PPO | Admitting: General Practice

## 2022-09-28 DIAGNOSIS — Z87891 Personal history of nicotine dependence: Secondary | ICD-10-CM | POA: Insufficient documentation

## 2022-09-28 DIAGNOSIS — K5730 Diverticulosis of large intestine without perforation or abscess without bleeding: Secondary | ICD-10-CM | POA: Insufficient documentation

## 2022-09-28 DIAGNOSIS — K449 Diaphragmatic hernia without obstruction or gangrene: Secondary | ICD-10-CM | POA: Diagnosis not present

## 2022-09-28 DIAGNOSIS — Z1211 Encounter for screening for malignant neoplasm of colon: Secondary | ICD-10-CM | POA: Diagnosis not present

## 2022-09-28 DIAGNOSIS — D125 Benign neoplasm of sigmoid colon: Secondary | ICD-10-CM | POA: Diagnosis not present

## 2022-09-28 DIAGNOSIS — Z86010 Personal history of colonic polyps: Secondary | ICD-10-CM | POA: Diagnosis not present

## 2022-09-28 DIAGNOSIS — K635 Polyp of colon: Secondary | ICD-10-CM | POA: Insufficient documentation

## 2022-09-28 DIAGNOSIS — J45909 Unspecified asthma, uncomplicated: Secondary | ICD-10-CM | POA: Insufficient documentation

## 2022-09-28 DIAGNOSIS — D649 Anemia, unspecified: Secondary | ICD-10-CM | POA: Insufficient documentation

## 2022-09-28 DIAGNOSIS — F419 Anxiety disorder, unspecified: Secondary | ICD-10-CM | POA: Diagnosis not present

## 2022-09-28 DIAGNOSIS — F32A Depression, unspecified: Secondary | ICD-10-CM | POA: Diagnosis not present

## 2022-09-28 DIAGNOSIS — D759 Disease of blood and blood-forming organs, unspecified: Secondary | ICD-10-CM | POA: Diagnosis not present

## 2022-09-28 DIAGNOSIS — K209 Esophagitis, unspecified without bleeding: Secondary | ICD-10-CM | POA: Diagnosis not present

## 2022-09-28 DIAGNOSIS — K2100 Gastro-esophageal reflux disease with esophagitis, without bleeding: Secondary | ICD-10-CM | POA: Insufficient documentation

## 2022-09-28 HISTORY — PX: POLYPECTOMY: SHX5525

## 2022-09-28 HISTORY — PX: ESOPHAGOGASTRODUODENOSCOPY (EGD) WITH PROPOFOL: SHX5813

## 2022-09-28 HISTORY — PX: COLONOSCOPY WITH PROPOFOL: SHX5780

## 2022-09-28 SURGERY — COLONOSCOPY WITH PROPOFOL
Anesthesia: General | Site: Rectum

## 2022-09-28 MED ORDER — PROPOFOL 10 MG/ML IV BOLUS
INTRAVENOUS | Status: DC | PRN
  Administered 2022-09-28: 80 mg via INTRAVENOUS
  Administered 2022-09-28 (×6): 20 mg via INTRAVENOUS

## 2022-09-28 MED ORDER — LIDOCAINE HCL (CARDIAC) PF 100 MG/5ML IV SOSY
PREFILLED_SYRINGE | INTRAVENOUS | Status: DC | PRN
  Administered 2022-09-28: 50 mg via INTRAVENOUS

## 2022-09-28 MED ORDER — SODIUM CHLORIDE 0.9 % IV SOLN: INTRAVENOUS | Status: DC

## 2022-09-28 MED ORDER — ACETAMINOPHEN 325 MG PO TABS: 650.0000 mg | ORAL_TABLET | Freq: Once | ORAL | Status: DC | PRN

## 2022-09-28 MED ORDER — ONDANSETRON HCL 4 MG/2ML IJ SOLN: 4.0000 mg | Freq: Once | INTRAMUSCULAR | Status: DC | PRN

## 2022-09-28 MED ORDER — STERILE WATER FOR IRRIGATION IR SOLN
Status: DC | PRN
  Administered 2022-09-28: 100 mL

## 2022-09-28 MED ORDER — ACETAMINOPHEN 160 MG/5ML PO SOLN: 325.0000 mg | ORAL | Status: DC | PRN

## 2022-09-28 MED ORDER — LACTATED RINGERS IV SOLN: INTRAVENOUS | Status: DC

## 2022-09-28 SURGICAL SUPPLY — 9 items
BLOCK BITE 60FR ADLT L/F GRN (MISCELLANEOUS) ×2 IMPLANT
GOWN CVR UNV OPN BCK APRN NK (MISCELLANEOUS) ×4 IMPLANT
GOWN ISOL THUMB LOOP REG UNIV (MISCELLANEOUS) ×4
KIT PRC NS LF DISP ENDO (KITS) ×2 IMPLANT
KIT PROCEDURE OLYMPUS (KITS) ×2
MANIFOLD NEPTUNE II (INSTRUMENTS) ×2 IMPLANT
SNARE COLD EXACTO (MISCELLANEOUS) IMPLANT
TRAP ETRAP POLY (MISCELLANEOUS) IMPLANT
WATER STERILE IRR 250ML POUR (IV SOLUTION) ×2 IMPLANT

## 2022-09-28 NOTE — Op Note (Signed)
[facility] Gastroenterology Patient Name: Nancy Hamilton Procedure Date: 09/28/2022 7:14 AM MRN: [id number] Account #: [id number] Date of Birth: October 27, 1946 Admit Type: Outpatient Age: 76 Room: [facility] OR ROOM 01 Gender: Female Note Status: Finalized Instrument Name: [id number] Procedure:             Upper GI endoscopy Indications:           Heartburn Providers:             Lucilla Lame MD, MD Referring MD:          Deborra Medina, MD (Referring MD) Medicines:             Propofol per Anesthesia Complications:         No immediate complications. Procedure:             Pre-Anesthesia Assessment:                        - Prior to the procedure, a History and Physical was                         performed, and patient medications and allergies were                         reviewed. The patient's tolerance of previous                         anesthesia was also reviewed. The risks and benefits                         of the procedure and the sedation options and risks                         were discussed with the patient. All questions were                         answered, and informed consent was obtained. Prior                         Anticoagulants: The patient has taken no anticoagulant                         or antiplatelet agents. ASA Grade Assessment: II - A                         patient with mild systemic disease. After reviewing                         the risks and benefits, the patient was deemed in                         satisfactory condition to undergo the procedure.                        After obtaining informed consent, the endoscope was                         passed under direct vision. Throughout the procedure,  the patient's blood pressure, pulse, and oxygen                         saturations were monitored continuously. The Endoscope                         was introduced through the mouth, and advanced to the                          second part of duodenum. The upper GI endoscopy was                         accomplished without difficulty. The patient tolerated                         the procedure well. Findings:      A medium-sized hiatal hernia was present.      LA Grade B (one or more mucosal breaks greater than 5 mm, not extending       between the tops of two mucosal folds) esophagitis with no bleeding was       found at the gastroesophageal junction.      The stomach was normal.      The examined duodenum was normal. Impression:            - Medium-sized hiatal hernia.                        - LA Grade B reflux esophagitis with no bleeding.                        - Normal stomach.                        - Normal examined duodenum.                        - No specimens collected. Recommendation:        - Discharge patient to home.                        - Resume previous diet.                        - Continue present medications.                        - Perform a colonoscopy. Procedure Code(s):     --- Professional ---                        [phone], Esophagogastroduodenoscopy, flexible,                         transoral; diagnostic, including collection of                         specimen(s) by brushing or washing, when performed                         (separate procedure) Diagnosis Code(s):     --- Professional ---  K21.00, Gastro-esophageal reflux disease with                         esophagitis, without bleeding CPT copyright 2022 American Medical Association. All rights reserved. The codes documented in this report are preliminary and upon coder review may  be revised to meet current compliance requirements. Lucilla Lame MD, MD 09/28/2022 7:35:01 AM This report has been signed electronically. Number of Addenda: 0 Note Initiated On: 09/28/2022 7:14 AM Estimated Blood Loss:  Estimated blood loss: none.      [facility]

## 2022-09-28 NOTE — Anesthesia Postprocedure Evaluation (Signed)
Anesthesia Post Note  Patient: Nancy Hamilton  Procedure(s) Performed: COLONOSCOPY WITH BIOPSY (Rectum) ESOPHAGOGASTRODUODENOSCOPY (EGD) WITH PROPOFOL (Mouth) POLYPECTOMY (Rectum)  Patient location during evaluation: PACU Anesthesia Type: General Level of consciousness: awake and alert, oriented and patient cooperative Pain management: pain level controlled Vital Signs Assessment: post-procedure vital signs reviewed and stable Respiratory status: spontaneous breathing, nonlabored ventilation and respiratory function stable Cardiovascular status: blood pressure returned to baseline and stable Postop Assessment: adequate PO intake Anesthetic complications: no   No notable events documented.   Last Vitals:  Vitals:   09/28/22 0800 09/28/22 0803  BP:  138/69  Pulse: 62 82  Resp: 16 14  Temp:    SpO2: 95% 98%    Last Pain:  Vitals:   09/28/22 0803  TempSrc:   PainSc: 0-No pain                 Darrin Nipper

## 2022-09-28 NOTE — Op Note (Signed)
[facility] Gastroenterology Patient Name: Nancy Hamilton Procedure Date: 09/28/2022 7:15 AM MRN: [id number] Account #: [id number] Date of Birth: [birth year] Admit Type: Outpatient Age: 76 Room: [facility] OR ROOM 01 Gender: Female Note Status: Finalized Instrument Name: [id number] Procedure:             Colonoscopy Indications:           High risk colon cancer surveillance: Personal history                         of colonic polyps Providers:             Lucilla Lame MD, MD Referring MD:          Deborra Medina, MD (Referring MD) Medicines:             Propofol per Anesthesia Complications:         No immediate complications. Procedure:             Pre-Anesthesia Assessment:                        - Prior to the procedure, a History and Physical was                         performed, and patient medications and allergies were                         reviewed. The patient's tolerance of previous                         anesthesia was also reviewed. The risks and benefits                         of the procedure and the sedation options and risks                         were discussed with the patient. All questions were                         answered, and informed consent was obtained. Prior                         Anticoagulants: The patient has taken no anticoagulant                         or antiplatelet agents. ASA Grade Assessment: II - A                         patient with mild systemic disease. After reviewing                         the risks and benefits, the patient was deemed in                         satisfactory condition to undergo the procedure.                        After obtaining informed consent, the colonoscope was  passed under direct vision. Throughout the procedure,                         the patient's blood pressure, pulse, and oxygen                         saturations were monitored continuously. The                          Colonoscope was introduced through the anus and                         advanced to the the cecum, identified by appendiceal                         orifice and ileocecal valve. The colonoscopy was                         performed without difficulty. The patient tolerated                         the procedure well. The quality of the bowel                         preparation was excellent. Findings:      The perianal and digital rectal examinations were normal.      A 5 mm polyp was found in the sigmoid colon. The polyp was sessile. The       polyp was removed with a cold snare. Resection and retrieval were       complete.      Multiple small-mouthed diverticula were found in the sigmoid colon and       descending colon. Impression:            - One 5 mm polyp in the sigmoid colon, removed with a                         cold snare. Resected and retrieved.                        - Diverticulosis in the sigmoid colon and in the                         descending colon. Recommendation:        - Discharge patient to home.                        - Resume previous diet.                        - Continue present medications.                        - Await pathology results.                        - Repeat colonoscopy is not recommended for                         surveillance. Procedure Code(s):     --- Professional ---  45385, Colonoscopy, flexible; with removal of                         tumor(s), polyp(s), or other lesion(s) by snare                         technique Diagnosis Code(s):     --- Professional ---                        Z86.010, Personal history of colonic polyps                        D12.5, Benign neoplasm of sigmoid colon CPT copyright 2022 American Medical Association. All rights reserved. The codes documented in this report are preliminary and upon coder review may  be revised to meet current compliance requirements. Lucilla Lame MD,  MD 09/28/2022 7:53:58 AM This report has been signed electronically. Number of Addenda: 0 Note Initiated On: 09/28/2022 7:15 AM Scope Withdrawal Time: 0 hours 5 minutes 53 seconds  Total Procedure Duration: 0 hours 15 minutes 39 seconds  Estimated Blood Loss:  Estimated blood loss: none.      [facility]

## 2022-09-28 NOTE — H&P (Addendum)
Lucilla Lame, MD [address] [place], [place] [address] Phone:[phone] Fax : [phone]  Primary Care Physician:  Crecencio Mc, MD Primary Gastroenterologist:  Dr. Allen Norris  Pre-Procedure History & Physical: HPI:  Nancy Hamilton is a 76 y.o. female is here for an endoscopy and colonoscopy.   Past Medical History:  Diagnosis Date   Allergy    Anemia    Anxiety    Arthritis    neck, no limitations   Cancer (HCC)    SKIN   Cough    CHRONIC FROM REFLUX   Depression    GERD (gastroesophageal reflux disease)    SLEEPS WITH HOB ELEVATED   Hyperlipidemia     Past Surgical History:  Procedure Laterality Date   BACK SURGERY  1980   Ruptured Disc   CATARACT EXTRACTION W/PHACO Right 11/28/2015   Procedure: CATARACT EXTRACTION PHACO AND INTRAOCULAR LENS PLACEMENT ([place]);  Surgeon: Estill Cotta, MD;  Location: [facility];  Service: Ophthalmology;  Laterality: Right;  [place] AP% CDE fluid pack lot # [id number]   CATARACT EXTRACTION W/PHACO Left 12/19/2015   Procedure: CATARACT EXTRACTION PHACO AND INTRAOCULAR LENS PLACEMENT ([facility]);  Surgeon: Estill Cotta, MD;  Location: [facility];  Service: Ophthalmology;  Laterality: Left;  US:1:08.2 AP%:56.7% CDE:28.10 Lot# [id number]   COLONOSCOPY     COLONOSCOPY WITH PROPOFOL N/A 10/10/2017   Procedure: COLONOSCOPY WITH PROPOFOL;  Surgeon: Lucilla Lame, MD;  Location: [facility];  Service: Endoscopy;  Laterality: N/A;   POLYPECTOMY  10/10/2017   Procedure: POLYPECTOMY;  Surgeon: Lucilla Lame, MD;  Location: [facility];  Service: Endoscopy;;    Prior to Admission medications   Medication Sig Start Date End Date Taking? Authorizing Provider  albuterol (VENTOLIN HFA) 108 (90 Base) MCG/ACT inhaler Inhale 2 puffs into the lungs every 6 (six) hours as needed for wheezing or shortness of breath (cough). 05/10/20  Yes Tyler Pita, MD  azithromycin (ZITHROMAX Z-PAK) 250 MG tablet Take 2 tablets (500 mg)  today, then 1 tablet (250 mg) for next 4 days. 09/23/22  Yes Immordino, Annie Main, FNP  Cholecalciferol 50 MCG (2000 UT) CAPS Take 1 capsule by mouth daily.   Yes [provider]  fluticasone (FLONASE) 50 MCG/ACT nasal spray PLACE TWO SPRAYS INTO BOTH NOSTRILS DAILY AS DIRECTED 06/11/22  Yes Crecencio Mc, MD  levocetirizine (XYZAL) 5 MG tablet TAKE ONE TABLET BY MOUTH EVERY EVEING ASDIRECTED 06/01/22  Yes Crecencio Mc, MD  montelukast (SINGULAIR) 10 MG tablet Take 1 tablet (10 mg total) by mouth daily. 04/25/22  Yes Tyler Pita, MD  Na Sulfate-K Sulfate-Mg Sulf 17.5-3.13-1.6 GM/177ML SOLN Take by mouth as directed. 09/17/22  Yes [provider]  pantoprazole (PROTONIX) 40 MG tablet TAKE 1 TABLET(40 MG) BY MOUTH TWICE DAILY 08/10/21  Yes Crecencio Mc, MD  rosuvastatin (CRESTOR) 5 MG tablet TAKE 1 TABLET BY MOUTH DAILY 08/23/22  Yes Crecencio Mc, MD  Spacer/Aero-Holding Chambers (AEROCHAMBER MV) inhaler Use as instructed 11/10/19  Yes Tyler Pita, MD  SYMBICORT 160-4.5 MCG/ACT inhaler INHALE TWO PUFFS TWICE A DAY 05/02/22  Yes Tyler Pita, MD  Ascorbic Acid (VITAMIN C) 1000 MG tablet Take 1,000 mg by mouth daily. Patient not taking: Reported on 09/21/2022    [provider]  cyanocobalamin (,VITAMIN B-12,) 1000 MCG/ML injection Inject 1 ml intrumuscularly daily x 3, then weekly x 4, then monthly thereafter Patient not taking: Reported on 09/21/2022 06/08/14   Crecencio Mc, MD  FLUoxetine [facility])  20 MG capsule TAKE 1 CAPSULE BY MOUTH EVERY DAY Patient not taking: Reported on 09/21/2022 03/07/22   Crecencio Mc, MD  hydrocortisone 2.5 % ointment Apply topically. Patient not taking: Reported on 09/21/2022 11/14/21   [provider]  meclizine (ANTIVERT) 12.5 MG tablet Take 1 tablet (12.5 mg total) by mouth 3 (three) times daily as needed for dizziness. Patient not taking: Reported on 09/21/2022 11/14/16   Burnard Hawthorne, FNP  Syringe,  Disposable, 1 ML MISC For use with B12 injections Patient not taking: Reported on 09/21/2022 06/08/14   Crecencio Mc, MD    Allergies as of 05/31/2022 - Review Complete 01/22/2022  Allergen Reaction Noted   Tylox [oxycodone-acetaminophen] Rash 08/10/2012    Family History  Problem Relation Age of Onset   Hyperlipidemia Sister    Fibromyalgia Sister    Heart disease Brother    Diabetes Brother    Hypertension Brother    Breast cancer Maternal Aunt 75   Diabetes Paternal Grandmother     Social History   Socioeconomic History   Marital status: Widowed    Spouse name: Not on file   Number of children: Not on file   Years of education: Not on file   Highest education level: Not on file  Occupational History   Not on file  Tobacco Use   Smoking status: Former    Packs/day: 0.75    Years: 15.00    Total pack years: 11.25    Types: Cigarettes    Quit date: 08/10/1992    Years since quitting: 30.1   Smokeless tobacco: Never  Vaping Use   Vaping Use: Never used  Substance and Sexual Activity   Alcohol use: No   Drug use: No   Sexual activity: Not Currently  Other Topics Concern   Not on file  Social History Narrative   Marital status: widowed.      Living with: alone.      Employment: none.      Tobacco: former user.      Alcohol: none      Drugs: none      Exercise: none; she tends to the farm.    Social Determinants of Health   Financial Resource Strain: Low Risk  (12/07/2021)   Overall Financial Resource Strain (CARDIA)    Difficulty of Paying Living Expenses: Not hard at all  Food Insecurity: No Food Insecurity (12/07/2021)   Hunger Vital Sign    Worried About Running Out of Food in the Last Year: Never true    Ran Out of Food in the Last Year: Never true  Transportation Needs: No Transportation Needs (12/07/2021)   PRAPARE - Hydrologist (Medical): No    Lack of Transportation (Non-Medical): No  Physical Activity: Sufficiently  Active (12/07/2021)   Exercise Vital Sign    Days of Exercise per Week: 5 days    Minutes of Exercise per Session: 60 min  Stress: No Stress Concern Present (12/07/2021)   [place]    Feeling of Stress : Not at all  Social Connections: Moderately Integrated (12/07/2021)   Social Connection and Isolation Panel [NHANES]    Frequency of Communication with Friends and Family: More than three times a week    Frequency of Social Gatherings with Friends and Family: More than three times a week    Attends Religious Services: More than 4 times per year    Active Member of  Clubs or Organizations: Yes    Attends Archivist Meetings: More than 4 times per year    Marital Status: Widowed  Intimate Partner Violence: Not At Risk (12/07/2021)   Humiliation, Afraid, Rape, and Kick questionnaire    Fear of Current or Ex-Partner: No    Emotionally Abused: No    Physically Abused: No    Sexually Abused: No    Review of Systems: See HPI, otherwise negative ROS  Physical Exam: BP (!) 148/71   Pulse 64   Temp 98.7 F (37.1 C) (Temporal)   Resp 20   Wt 56.2 kg   SpO2 98%   BMI 21.97 kg/m  General:   Alert,  pleasant and cooperative in NAD Head:  Normocephalic and atraumatic. Neck:  Supple; no masses or thyromegaly. Lungs:  Clear throughout to auscultation.    Heart:  Regular rate and rhythm. Abdomen:  Soft, nontender and nondistended. Normal bowel sounds, without guarding, and without rebound.   Neurologic:  Alert and  oriented x4;  grossly normal neurologically.  Impression/Plan: BROOKS STOTZ is here for an endoscopy and colonoscopy to be performed for GERD and a history of adenomatous polyps on 2018   Risks, benefits, limitations, and alternatives regarding  endoscopy and colonoscopy have been reviewed with the patient.  Questions have been answered.  All parties agreeable.   Lucilla Lame, MD  09/28/2022, 7:11  AM

## 2022-09-28 NOTE — Transfer of Care (Signed)
Immediate Anesthesia Transfer of Care Note  Patient: Nancy Hamilton  Procedure(s) Performed: COLONOSCOPY WITH BIOPSY (Rectum) ESOPHAGOGASTRODUODENOSCOPY (EGD) WITH PROPOFOL (Mouth) POLYPECTOMY (Rectum)  Patient Location: PACU  Anesthesia Type: General  Level of Consciousness: awake, alert  and patient cooperative  Airway and Oxygen Therapy: Patient Spontanous Breathing and Patient connected to supplemental oxygen  Post-op Assessment: Post-op Vital signs reviewed, Patient's Cardiovascular Status Stable, Respiratory Function Stable, Patent Airway and No signs of Nausea or vomiting  Post-op Vital Signs: Reviewed and stable  Complications: No notable events documented.

## 2022-10-01 ENCOUNTER — Encounter: Payer: Self-pay | Admitting: Gastroenterology

## 2022-10-02 ENCOUNTER — Encounter: Payer: Self-pay | Admitting: Gastroenterology

## 2022-10-02 LAB — SURGICAL PATHOLOGY

## 2022-10-16 ENCOUNTER — Ambulatory Visit (INDEPENDENT_AMBULATORY_CARE_PROVIDER_SITE_OTHER): Payer: PPO

## 2022-10-16 ENCOUNTER — Ambulatory Visit: Payer: PPO

## 2022-10-16 DIAGNOSIS — E538 Deficiency of other specified B group vitamins: Secondary | ICD-10-CM | POA: Diagnosis not present

## 2022-10-16 MED ORDER — CYANOCOBALAMIN 1000 MCG/ML IJ SOLN
1000.0000 ug | Freq: Once | INTRAMUSCULAR | Status: AC
  Administered 2022-10-16: 1000 ug via INTRAMUSCULAR

## 2022-10-16 NOTE — Progress Notes (Signed)
Pt presented for their vitamin B12 injection. Pt was identified through two identifiers. Pt tolerated shot well in their right deltoid.  

## 2022-10-31 DIAGNOSIS — D3131 Benign neoplasm of right choroid: Secondary | ICD-10-CM | POA: Diagnosis not present

## 2022-11-20 ENCOUNTER — Ambulatory Visit (INDEPENDENT_AMBULATORY_CARE_PROVIDER_SITE_OTHER): Payer: PPO

## 2022-11-20 ENCOUNTER — Ambulatory Visit: Payer: PPO

## 2022-11-20 DIAGNOSIS — E538 Deficiency of other specified B group vitamins: Secondary | ICD-10-CM | POA: Diagnosis not present

## 2022-11-20 MED ORDER — CYANOCOBALAMIN 1000 MCG/ML IJ SOLN
1000.0000 ug | Freq: Once | INTRAMUSCULAR | Status: AC
  Administered 2022-11-20: 1000 ug via INTRAMUSCULAR

## 2022-11-20 NOTE — Progress Notes (Signed)
Patient presented for B 12 injection to left deltoid, patient voiced no concerns nor showed any signs of distress during injection. 

## 2022-12-12 ENCOUNTER — Other Ambulatory Visit: Payer: Self-pay | Admitting: Internal Medicine

## 2022-12-12 ENCOUNTER — Telehealth: Payer: Self-pay

## 2022-12-12 NOTE — Telephone Encounter (Signed)
Lm for pt to cb re :  Sched AWV

## 2022-12-18 ENCOUNTER — Encounter: Payer: Self-pay | Admitting: Pulmonary Disease

## 2022-12-24 DIAGNOSIS — M7918 Myalgia, other site: Secondary | ICD-10-CM | POA: Diagnosis not present

## 2022-12-25 ENCOUNTER — Ambulatory Visit (INDEPENDENT_AMBULATORY_CARE_PROVIDER_SITE_OTHER): Payer: HMO

## 2022-12-25 DIAGNOSIS — E538 Deficiency of other specified B group vitamins: Secondary | ICD-10-CM

## 2022-12-25 MED ORDER — CYANOCOBALAMIN 1000 MCG/ML IJ SOLN
1000.0000 ug | Freq: Once | INTRAMUSCULAR | Status: AC
  Administered 2022-12-25: 1000 ug via INTRAMUSCULAR

## 2022-12-25 NOTE — Progress Notes (Signed)
Patient presented for B 12 injection to right deltoid, patient voiced no concerns nor showed any signs of distress during injection. 

## 2022-12-31 ENCOUNTER — Ambulatory Visit (INDEPENDENT_AMBULATORY_CARE_PROVIDER_SITE_OTHER): Payer: HMO

## 2022-12-31 VITALS — BP 137/84 | HR 65 | Temp 97.1°F | Resp 15 | Ht 63.0 in | Wt 134.0 lb

## 2022-12-31 DIAGNOSIS — Z1231 Encounter for screening mammogram for malignant neoplasm of breast: Secondary | ICD-10-CM

## 2022-12-31 DIAGNOSIS — Z0000 Encounter for general adult medical examination without abnormal findings: Secondary | ICD-10-CM

## 2022-12-31 NOTE — Patient Instructions (Addendum)
Nancy Hamilton , Thank you for taking time to come for your Medicare Wellness Visit. I appreciate your ongoing commitment to your health goals. Please review the following plan we discussed and let me know if I can assist you in the future.   These are the goals we discussed:  Goals       Patient Stated     DIET - INCREASE LEAN PROTEINS (pt-stated)      Weight 127lb. Low carb diet. Monitor sugar intake. Stay active. Stay hydrated.      Other     Increase physical activity      Walk for exercise        This is a list of the screening recommended for you and due dates:  Health Maintenance  Topic Date Due   COVID-19 Vaccine (4 - 2023-24 season) 01/16/2023*   Mammogram  06/05/2023   Medicare Annual Wellness Visit  01/01/2024   Colon Cancer Screening  09/29/2027   DTaP/Tdap/Td vaccine (3 - Td or Tdap) 07/19/2031   Pneumonia Vaccine  Completed   Flu Shot  Completed   DEXA scan (bone density measurement)  Completed   Hepatitis C Screening: USPSTF Recommendation to screen - Ages 36-79 yo.  Completed   Zoster (Shingles) Vaccine  Completed   HPV Vaccine  Aged Out  *Topic was postponed. The date shown is not the original due date.    Advanced directives: End of life planning; Advance aging; Advanced directives discussed.  Copy of current HCPOA/Living Will requested.    Conditions/risks identified: none new.  Next appointment: Follow up in one year for your annual wellness visit    Preventive Care 65 Years and Older, Female Preventive care refers to lifestyle choices and visits with your health care provider that can promote health and wellness. What does preventive care include? A yearly physical exam. This is also called an annual well check. Dental exams once or twice a year. Routine eye exams. Ask your health care provider how often you should have your eyes checked. Personal lifestyle choices, including: Daily care of your teeth and gums. Regular physical activity. Eating  a healthy diet. Avoiding tobacco and drug use. Limiting alcohol use. Practicing safe sex. Taking low-dose aspirin every day. Taking vitamin and mineral supplements as recommended by your health care provider. What happens during an annual well check? The services and screenings done by your health care provider during your annual well check will depend on your age, overall health, lifestyle risk factors, and family history of disease. Counseling  Your health care provider may ask you questions about your: Alcohol use. Tobacco use. Drug use. Emotional well-being. Home and relationship well-being. Sexual activity. Eating habits. History of falls. Memory and ability to understand (cognition). Work and work Statistician. Reproductive health. Screening  You may have the following tests or measurements: Height, weight, and BMI. Blood pressure. Lipid and cholesterol levels. These may be checked every 5 years, or more frequently if you are over 79 years old. Skin check. Lung cancer screening. You may have this screening every year starting at age 14 if you have a 30-pack-year history of smoking and currently smoke or have quit within the past 15 years. Fecal occult blood test (FOBT) of the stool. You may have this test every year starting at age 75. Flexible sigmoidoscopy or colonoscopy. You may have a sigmoidoscopy every 5 years or a colonoscopy every 10 years starting at age 61. Hepatitis C blood test. Hepatitis B blood test. Sexually transmitted disease (STD)  testing. Diabetes screening. This is done by checking your blood sugar (glucose) after you have not eaten for a while (fasting). You may have this done every 1-3 years. Bone density scan. This is done to screen for osteoporosis. You may have this done starting at age 64. Mammogram. This may be done every 1-2 years. Talk to your health care provider about how often you should have regular mammograms. Talk with your health care  provider about your test results, treatment options, and if necessary, the need for more tests. Vaccines  Your health care provider may recommend certain vaccines, such as: Influenza vaccine. This is recommended every year. Tetanus, diphtheria, and acellular pertussis (Tdap, Td) vaccine. You may need a Td booster every 10 years. Zoster vaccine. You may need this after age 58. Pneumococcal 13-valent conjugate (PCV13) vaccine. One dose is recommended after age 42. Pneumococcal polysaccharide (PPSV23) vaccine. One dose is recommended after age 77. Talk to your health care provider about which screenings and vaccines you need and how often you need them. This information is not intended to replace advice given to you by your health care provider. Make sure you discuss any questions you have with your health care provider. Document Released: 11/25/2015 Document Revised: 07/18/2016 Document Reviewed: 08/30/2015 Elsevier Interactive Patient Education  2017 [place] Prevention in the Home Falls can cause injuries. They can happen to people of all ages. There are many things you can do to make your home safe and to help prevent falls. What can I do on the outside of my home? Regularly fix the edges of walkways and driveways and fix any cracks. Remove anything that might make you trip as you walk through a door, such as a raised step or threshold. Trim any bushes or trees on the path to your home. Use bright outdoor lighting. Clear any walking paths of anything that might make someone trip, such as rocks or tools. Regularly check to see if handrails are loose or broken. Make sure that both sides of any steps have handrails. Any raised decks and porches should have guardrails on the edges. Have any leaves, snow, or ice cleared regularly. Use sand or salt on walking paths during winter. Clean up any spills in your garage right away. This includes oil or grease spills. What can I do in the  bathroom? Use night lights. Install grab bars by the toilet and in the tub and shower. Do not use towel bars as grab bars. Use non-skid mats or decals in the tub or shower. If you need to sit down in the shower, use a plastic, non-slip stool. Keep the floor dry. Clean up any water that spills on the floor as soon as it happens. Remove soap buildup in the tub or shower regularly. Attach bath mats securely with double-sided non-slip rug tape. Do not have throw rugs and other things on the floor that can make you trip. What can I do in the bedroom? Use night lights. Make sure that you have a light by your bed that is easy to reach. Do not use any sheets or blankets that are too big for your bed. They should not hang down onto the floor. Have a firm chair that has side arms. You can use this for support while you get dressed. Do not have throw rugs and other things on the floor that can make you trip. What can I do in the kitchen? Clean up any spills right away. Avoid walking on wet  floors. Keep items that you use a lot in easy-to-reach places. If you need to reach something above you, use a strong step stool that has a grab bar. Keep electrical cords out of the way. Do not use floor polish or wax that makes floors slippery. If you must use wax, use non-skid floor wax. Do not have throw rugs and other things on the floor that can make you trip. What can I do with my stairs? Do not leave any items on the stairs. Make sure that there are handrails on both sides of the stairs and use them. Fix handrails that are broken or loose. Make sure that handrails are as long as the stairways. Check any carpeting to make sure that it is firmly attached to the stairs. Fix any carpet that is loose or worn. Avoid having throw rugs at the top or bottom of the stairs. If you do have throw rugs, attach them to the floor with carpet tape. Make sure that you have a light switch at the top of the stairs and the  bottom of the stairs. If you do not have them, ask someone to add them for you. What else can I do to help prevent falls? Wear shoes that: Do not have high heels. Have rubber bottoms. Are comfortable and fit you well. Are closed at the toe. Do not wear sandals. If you use a stepladder: Make sure that it is fully opened. Do not climb a closed stepladder. Make sure that both sides of the stepladder are locked into place. Ask someone to hold it for you, if possible. Clearly mark and make sure that you can see: Any grab bars or handrails. First and last steps. Where the edge of each step is. Use tools that help you move around (mobility aids) if they are needed. These include: Canes. Walkers. Scooters. Crutches. Turn on the lights when you go into a dark area. Replace any light bulbs as soon as they burn out. Set up your furniture so you have a clear path. Avoid moving your furniture around. If any of your floors are uneven, fix them. If there are any pets around you, be aware of where they are. Review your medicines with your doctor. Some medicines can make you feel dizzy. This can increase your chance of falling. Ask your doctor what other things that you can do to help prevent falls. This information is not intended to replace advice given to you by your health care provider. Make sure you discuss any questions you have with your health care provider. Document Released: 08/25/2009 Document Revised: 04/05/2016 Document Reviewed: 12/03/2014 Elsevier Interactive Patient Education  2017 [employer].

## 2022-12-31 NOTE — Progress Notes (Addendum)
Subjective:   Nancy Hamilton is a 77 y.o. female who presents for Medicare Annual (Subsequent) preventive examination.  Review of Systems    No ROS.  Medicare Wellness Virtual Visit.  Visual/audio telehealth visit, UTA vital signs.   See social history for additional risk factors.   Cardiac Risk Factors include: advanced age (>71mn, >>75women)     Objective:    Today's Vitals   12/31/22 1011  BP: 137/84  Pulse: 65  Resp: 15  Temp: (!) 97.1 F (36.2 C)  SpO2: 99%  Weight: 134 lb (60.8 kg)  Height: '5\' 3"'$  (1.6 m)   Body mass index is 23.74 kg/m.     12/31/2022   10:25 AM 12/07/2021   10:03 AM 12/06/2020   10:16 AM 12/04/2019    9:42 AM 06/26/2018    8:12 AM 10/10/2017    7:07 AM 06/25/2017    8:22 AM  Advanced Directives  Does Patient Have a Medical Advance Directive? Yes Yes Yes Yes Yes Yes No  Type of AParamedicof AViennaLiving will HAnzac VillageLiving will HRed OakLiving will HLewisLiving will HPatagoniaLiving will HRiverviewLiving will   Does patient want to make changes to medical advance directive? No - Patient declined No - Patient declined No - Patient declined No - Patient declined No - Patient declined  Yes (MAU/Ambulatory/Procedural Areas - Information given)  Copy of [place]in Chart? No - copy requested Yes - validated most recent copy scanned in chart (See row information) No - copy requested Yes - validated most recent copy scanned in chart (See row information) No - copy requested No - copy requested     Current Medications (verified) Outpatient Encounter Medications as of 12/31/2022  Medication Sig   albuterol (VENTOLIN HFA) 108 (90 Base) MCG/ACT inhaler Inhale 2 puffs into the lungs every 6 (six) hours as needed for wheezing or shortness of breath (cough).   Ascorbic Acid (VITAMIN C) 1000 MG tablet Take 1,000 mg  by mouth daily. (Patient not taking: Reported on 09/21/2022)   azithromycin (ZITHROMAX Z-PAK) 250 MG tablet Take 2 tablets (500 mg) today, then 1 tablet (250 mg) for next 4 days.   Cholecalciferol 50 MCG (2000 UT) CAPS Take 1 capsule by mouth daily.   cyanocobalamin (,VITAMIN B-12,) 1000 MCG/ML injection Inject 1 ml intrumuscularly daily x 3, then weekly x 4, then monthly thereafter (Patient not taking: Reported on 09/21/2022)   FLUoxetine (PROZAC) 20 MG capsule TAKE 1 CAPSULE BY MOUTH EVERY DAY (Patient not taking: Reported on 09/21/2022)   fluticasone (FLONASE) 50 MCG/ACT nasal spray PLACE TWO SPRAYS INTO BOTH NOSTRILS DAILY AS DIRECTED   hydrocortisone 2.5 % ointment Apply topically. (Patient not taking: Reported on 09/21/2022)   levocetirizine (XYZAL) 5 MG tablet TAKE ONE TABLET BY MOUTH EVERY EVENING AS DIRECTED   meclizine (ANTIVERT) 12.5 MG tablet Take 1 tablet (12.5 mg total) by mouth 3 (three) times daily as needed for dizziness. (Patient not taking: Reported on 09/21/2022)   montelukast (SINGULAIR) 10 MG tablet Take 1 tablet (10 mg total) by mouth daily.   Na Sulfate-K Sulfate-Mg Sulf 17.5-3.13-1.6 GM/177ML SOLN Take by mouth as directed.   pantoprazole (PROTONIX) 40 MG tablet TAKE 1 TABLET(40 MG) BY MOUTH TWICE DAILY   rosuvastatin (CRESTOR) 5 MG tablet TAKE 1 TABLET BY MOUTH DAILY   Spacer/Aero-Holding Chambers (AEROCHAMBER MV) inhaler Use as instructed   SYMBICORT 160-4.5 MCG/ACT inhaler  INHALE TWO PUFFS TWICE A DAY   Syringe, Disposable, 1 ML MISC For use with B12 injections (Patient not taking: Reported on 09/21/2022)   No facility-administered encounter medications on file as of 12/31/2022.    Allergies (verified) Tylox [oxycodone-acetaminophen]   History: Past Medical History:  Diagnosis Date   Allergy    Anemia    Anxiety    Arthritis    neck, no limitations   Cancer (HCC)    SKIN   Cough    CHRONIC FROM REFLUX   Depression    GERD (gastroesophageal reflux  disease)    SLEEPS WITH HOB ELEVATED   Hyperlipidemia    Past Surgical History:  Procedure Laterality Date   BACK SURGERY  1980   Ruptured Disc   CATARACT EXTRACTION W/PHACO Right 11/28/2015   Procedure: CATARACT EXTRACTION PHACO AND INTRAOCULAR LENS PLACEMENT ([place]);  Surgeon: Estill Cotta, MD;  Location: [facility];  Service: Ophthalmology;  Laterality: Right;  [place] AP% CDE fluid pack lot # [id number]   CATARACT EXTRACTION W/PHACO Left 12/19/2015   Procedure: CATARACT EXTRACTION PHACO AND INTRAOCULAR LENS PLACEMENT (IOC);  Surgeon: Estill Cotta, MD;  Location: [facility];  Service: Ophthalmology;  Laterality: Left;  US:1:08.2 AP%:56.7% CDE:28.10 Lot# [id number]   COLONOSCOPY     COLONOSCOPY WITH PROPOFOL N/A 10/10/2017   Procedure: COLONOSCOPY WITH PROPOFOL;  Surgeon: Lucilla Lame, MD;  Location: [place];  Service: Endoscopy;  Laterality: N/A;   COLONOSCOPY WITH PROPOFOL N/A 09/28/2022   Procedure: COLONOSCOPY WITH BIOPSY;  Surgeon: Lucilla Lame, MD;  Location: [place];  Service: Endoscopy;  Laterality: N/A;   ESOPHAGOGASTRODUODENOSCOPY (EGD) WITH PROPOFOL N/A 09/28/2022   Procedure: ESOPHAGOGASTRODUODENOSCOPY (EGD) WITH PROPOFOL;  Surgeon: Lucilla Lame, MD;  Location: [place];  Service: Endoscopy;  Laterality: N/A;   POLYPECTOMY  10/10/2017   Procedure: POLYPECTOMY;  Surgeon: Lucilla Lame, MD;  Location: [place];  Service: Endoscopy;;   POLYPECTOMY N/A 09/28/2022   Procedure: POLYPECTOMY;  Surgeon: Lucilla Lame, MD;  Location: [place];  Service: Endoscopy;  Laterality: N/A;   Family History  Problem Relation Age of Onset   Hyperlipidemia Sister    Fibromyalgia Sister    Heart disease Brother    Diabetes Brother    Hypertension Brother    Breast cancer Maternal Aunt 75   Diabetes Paternal Grandmother    Social History   Socioeconomic History   Marital status: Widowed    Spouse name: Not on file   Number of  children: Not on file   Years of education: Not on file   Highest education level: Not on file  Occupational History   Not on file  Tobacco Use   Smoking status: Former    Packs/day: 0.75    Years: 15.00    Total pack years: 11.25    Types: Cigarettes    Quit date: 08/10/1992    Years since quitting: 30.4   Smokeless tobacco: Never  Vaping Use   Vaping Use: Never used  Substance and Sexual Activity   Alcohol use: No   Drug use: No   Sexual activity: Not Currently  Other Topics Concern   Not on file  Social History Narrative   Marital status: widowed.      Living with: alone.      Employment: none.      Tobacco: former user.      Alcohol: none      Drugs: none      Exercise: none; she tends to the farm.  Social Determinants of Health   Financial Resource Strain: Low Risk  (12/31/2022)   Overall Financial Resource Strain (CARDIA)    Difficulty of Paying Living Expenses: Not hard at all  Food Insecurity: No Food Insecurity (12/31/2022)   Hunger Vital Sign    Worried About Running Out of Food in the Last Year: Never true    Ran Out of Food in the Last Year: Never true  Transportation Needs: No Transportation Needs (12/31/2022)   PRAPARE - Hydrologist (Medical): No    Lack of Transportation (Non-Medical): No  Physical Activity: Sufficiently Active (12/31/2022)   Exercise Vital Sign    Days of Exercise per Week: 3 days    Minutes of Exercise per Session: 60 min  Stress: No Stress Concern Present (12/31/2022)   [place]    Feeling of Stress : Not at all  Social Connections: Moderately Integrated (12/31/2022)   Social Connection and Isolation Panel [NHANES]    Frequency of Communication with Friends and Family: More than three times a week    Frequency of Social Gatherings with Friends and Family: More than three times a week    Attends Religious Services: More than 4 times per  year    Active Member of [employer] or Organizations: Yes    Attends Archivist Meetings: More than 4 times per year    Marital Status: Widowed    Tobacco Counseling Counseling given: Not Answered   Clinical Intake:  Pre-visit preparation completed: Yes        Diabetes: No  How often do you need to have someone help you when you read instructions, pamphlets, or other written materials from your doctor or pharmacy?: 1 - Never    Interpreter Needed?: No      Activities of Daily Living    12/31/2022   10:20 AM  In your present state of health, do you have any difficulty performing the following activities:  Hearing? 0  Vision? 0  Difficulty concentrating or making decisions? 0  Walking or climbing stairs? 0  Dressing or bathing? 0  Doing errands, shopping? 0  Preparing Food and eating ? N  Using the Toilet? N  In the past six months, have you accidently leaked urine? N  Do you have problems with loss of bowel control? N  Managing your Medications? N  Managing your Finances? N  Housekeeping or managing your Housekeeping? N    Patient Care Team: Crecencio Mc, MD as PCP - General (Internal Medicine)  Indicate any recent Medical Services you may have received from other than [facility] providers in the past year (date may be approximate).     Assessment:   This is a routine wellness examination for Hassie.  Hearing/Vision screen Hearing Screening - Comments:: Patient is able to hear conversational tones without difficulty.  No issues reported.   Vision Screening - Comments:: Followed by [facility]  Annual visits Cataract extraction, bilateral They have regular follow up with the ophthalmologist   Dietary issues and exercise activities discussed: Current Exercise Habits: Home exercise routine, Type of exercise: walking, Time (Minutes): 60, Frequency (Times/Week): 3, Weekly Exercise (Minutes/Week): 180, Intensity: Mild She tries to have a healthy  diet   Goals Addressed               This Visit's Progress     Patient Stated     DIET - INCREASE LEAN PROTEINS (pt-stated)  Weight 127lb. Low carb diet. Monitor sugar intake. Stay active. Stay hydrated.       Depression Screen    12/31/2022   10:13 AM 07/18/2022    9:20 AM 12/07/2021   10:02 AM 08/30/2021   10:38 AM 07/12/2021    9:13 AM 12/06/2020   10:14 AM 12/04/2019    9:45 AM  PHQ 2/9 Scores  PHQ - 2 Score 0 0 0 0 1 0 1    Fall Risk    12/31/2022   10:16 AM 07/18/2022    9:20 AM 01/22/2022   10:03 AM 12/07/2021   10:04 AM 08/30/2021   10:38 AM  Fall Risk   Falls in the past year? 0 0 0 0 0  Number falls in past yr: 0   0   Injury with Fall? 0   0   Risk for fall due to :  No Fall Risks No Fall Risks  No Fall Risks  Follow up Falls evaluation completed;Falls prevention discussed Falls evaluation completed Falls evaluation completed Falls evaluation completed Falls evaluation completed    FALL RISK PREVENTION PERTAINING TO THE HOME: Home free of loose throw rugs in walkways, pet beds, electrical cords, etc? Yes  Adequate lighting in your home to reduce risk of falls? Yes   ASSISTIVE DEVICES UTILIZED TO PREVENT FALLS: Life alert? No  Use of a cane, walker or w/c? No  Grab bars in the bathroom? Yes  Made in shower chair or bench in shower? Yes  Comfort chair height toilet? Yes   TIMED UP AND GO: Was the test performed? Yes .  Length of time to ambulate 10 feet: 10 sec.   Gait steady and fast without use of assistive device  Cognitive Function:    06/26/2018    8:17 AM  MMSE - Mini Mental State Exam  Orientation to time 5  Orientation to Place 5  Registration 3  Attention/ Calculation 5  Recall 3  Language- name 2 objects 2  Language- repeat 1  Language- follow 3 step command 3  Language- read & follow direction 1  Write a sentence 1  Copy design 1  Total score 30        12/31/2022   10:21 AM 12/04/2019    9:50 AM  6CIT Screen  What  Year? 0 points 0 points  What month? 0 points 0 points  What time? 0 points 0 points  Count back from 20 0 points   Months in reverse 0 points   Repeat phrase 0 points   Total Score 0 points     Immunizations Immunization History  Administered Date(s) Administered   Fluad Quad(high Dose 65+) 08/25/2019, 09/06/2020, 08/22/2021, 09/11/2022   Influenza Split 08/10/2012   Influenza, High Dose Seasonal PF 09/14/2016, 09/03/2017, 08/19/2018   Influenza,inj,Quad PF,6+ Mos 08/20/2013, 08/26/2014, 09/06/2015   Moderna Sars-Covid-2 Vaccination 12/22/2019, 01/19/2020, 09/26/2020   Pneumococcal Conjugate-13 02/25/2014   Pneumococcal Polysaccharide-23 02/18/2013, 06/22/2020   Tdap 12/01/2010, 07/18/2021   Zoster Recombinat (Shingrix) 10/23/2019, 02/10/2020   Zoster, Live 10/20/2015   Screening Tests Health Maintenance  Topic Date Due   COVID-19 Vaccine (4 - 2023-24 season) 01/16/2023 (Originally 07/13/2022)   MAMMOGRAM  06/05/2023   Medicare Annual Wellness (AWV)  01/01/2024   COLONOSCOPY (Pts 45-63yr Insurance coverage will need to be confirmed)  09/29/2027   DTaP/Tdap/Td (3 - Td or Tdap) 07/19/2031   Pneumonia Vaccine 76 Years old  Completed   INFLUENZA VACCINE  Completed   DEXA SCAN  Completed  Hepatitis C Screening  Completed   Zoster Vaccines- Shingrix  Completed   HPV VACCINES  Aged Out   Health Maintenance There are no preventive care reminders to display for this patient.  Mammogram- ordered.   Lung Cancer Screening: (Low Dose CT Chest recommended if Age 70-80 years, 30 pack-year currently smoking OR have quit w/in 15years.) does not qualify.   Hepatitis C Screening: Completed 2016.   Vision Screening: Recommended annual ophthalmology exams for early detection of glaucoma and other disorders of the eye.  Dental Screening: Recommended annual dental exams for proper oral hygiene  Community Resource Referral / Chronic Care Management: CRR required this visit?  No   CCM  required this visit?  No      Plan:     I have personally reviewed and noted the following in the patient's chart:   Medical and social history Use of alcohol, tobacco or illicit drugs  Current medications and supplements including opioid prescriptions. Patient is not currently taking opioid prescriptions. Functional ability and status Nutritional status Physical activity Advanced directives List of other physicians Hospitalizations, surgeries, and ER visits in previous 12 months Vitals Screenings to include cognitive, depression, and falls Referrals and appointments  In addition, I have reviewed and discussed with patient certain preventive protocols, quality metrics, and best practice recommendations. A written personalized care plan for preventive services as well as general preventive health recommendations were provided to patient.     [place], LPN   [id number]     I have reviewed the above information and agree with above.   Deborra Medina, MD

## 2023-01-03 ENCOUNTER — Other Ambulatory Visit: Payer: Self-pay

## 2023-01-03 DIAGNOSIS — R208 Other disturbances of skin sensation: Secondary | ICD-10-CM | POA: Diagnosis not present

## 2023-01-03 DIAGNOSIS — D485 Neoplasm of uncertain behavior of skin: Secondary | ICD-10-CM | POA: Diagnosis not present

## 2023-01-03 DIAGNOSIS — C44722 Squamous cell carcinoma of skin of right lower limb, including hip: Secondary | ICD-10-CM | POA: Diagnosis not present

## 2023-01-03 MED ORDER — SYMBICORT 160-4.5 MCG/ACT IN AERO: INHALATION_SPRAY | RESPIRATORY_TRACT | 1 refills | Status: DC

## 2023-01-03 NOTE — Telephone Encounter (Signed)
I received the below message from the patient's pharmacy. I have resent the prescription to [facility] and it has been confirmed that they received it.   E-Prescribed Message Needing Your Attention Received: Today Interface, [place] Refill An error occurred while processing the e-prescribing message.  The message was not sent electronically to the requested pharmacy. Contact the pharmacy about the new prescription.  Code: 600 - Communication problem - try again later Fax receipt could not be confirmed       Outpatient Medication Detail   Disp Refills Start End   SYMBICORT 160-4.5 MCG/ACT inhaler (Discontinued) 30.6 g 1 01/03/2023 01/03/2023   Sig: INHALE TWO PUFFS TWICE A DAY   Sent to pharmacy as: SYMBICORT 160-4.5 MCG/ACT inhaler   Notes to Pharmacy: PT NEEDS A REFILL FOR 90 DAYS SUPPLY.   Reason for Discontinue: Reorder   E-Prescribing Status: Transmission to pharmacy failed (01/03/2023  2:12 PM EST)

## 2023-01-03 NOTE — Telephone Encounter (Signed)
Symbicort Rx sent to [facility] as requested by pharmacy.

## 2023-01-04 ENCOUNTER — Other Ambulatory Visit: Payer: Self-pay

## 2023-01-04 MED ORDER — SYMBICORT 160-4.5 MCG/ACT IN AERO: INHALATION_SPRAY | RESPIRATORY_TRACT | 1 refills | Status: DC

## 2023-01-09 ENCOUNTER — Telehealth: Payer: Self-pay | Admitting: Pulmonary Disease

## 2023-01-09 NOTE — Telephone Encounter (Signed)
Patient states Symbicort inhaler is too expensive. Would like to know if we have samples. Pharmacy is [facility]. Patient phone number is [phone].

## 2023-01-09 NOTE — Telephone Encounter (Signed)
Can you please run the patient's insurance and see what a cheaper option would be? Thank you!

## 2023-01-10 ENCOUNTER — Other Ambulatory Visit (HOSPITAL_COMMUNITY): Payer: Self-pay

## 2023-01-10 MED ORDER — FLUTICASONE-SALMETEROL 250-50 MCG/ACT IN AEPB: 1.0000 | INHALATION_SPRAY | Freq: Two times a day (BID) | RESPIRATORY_TRACT | 3 refills | Status: DC

## 2023-01-10 NOTE — Telephone Encounter (Signed)
Generic Advair Diskus is covered under the patients plan at this time.

## 2023-01-10 NOTE — Telephone Encounter (Signed)
Are you ok with changing her inhaler to Advair?

## 2023-01-10 NOTE — Telephone Encounter (Signed)
Can try Advair 250/50, 1 inhalation twice a day.  Make sure she is instructed on rinsing her mouth well after use.

## 2023-01-10 NOTE — Telephone Encounter (Signed)
I have notified the patient and I have sent in the script to her pharmacy.  Nothing further needed.

## 2023-01-15 ENCOUNTER — Other Ambulatory Visit: Payer: Self-pay

## 2023-01-15 MED ORDER — PANTOPRAZOLE SODIUM 40 MG PO TBEC: DELAYED_RELEASE_TABLET | ORAL | 1 refills | Status: DC

## 2023-01-18 ENCOUNTER — Telehealth: Payer: Self-pay | Admitting: Pulmonary Disease

## 2023-01-18 NOTE — Telephone Encounter (Signed)
I spoke with the patient and instructed her on how to use the Advair inhaler. She understands how to use it now.  Nothing further needed.

## 2023-01-18 NOTE — Telephone Encounter (Signed)
Pt states new inhaler is flat and she doesn't know how to use it. She is asking for a call to walk her thru it.

## 2023-01-23 ENCOUNTER — Ambulatory Visit: Payer: PPO | Admitting: Pulmonary Disease

## 2023-01-25 DIAGNOSIS — M7918 Myalgia, other site: Secondary | ICD-10-CM | POA: Diagnosis not present

## 2023-01-25 DIAGNOSIS — S76312D Strain of muscle, fascia and tendon of the posterior muscle group at thigh level, left thigh, subsequent encounter: Secondary | ICD-10-CM | POA: Diagnosis not present

## 2023-01-29 ENCOUNTER — Ambulatory Visit (INDEPENDENT_AMBULATORY_CARE_PROVIDER_SITE_OTHER): Payer: HMO

## 2023-01-29 DIAGNOSIS — E538 Deficiency of other specified B group vitamins: Secondary | ICD-10-CM | POA: Diagnosis not present

## 2023-01-29 MED ORDER — CYANOCOBALAMIN 1000 MCG/ML IJ SOLN
1000.0000 ug | Freq: Once | INTRAMUSCULAR | Status: AC
  Administered 2023-01-29: 1000 ug via INTRAMUSCULAR

## 2023-01-29 NOTE — Progress Notes (Signed)
Patient arrived for her B12 injection and it was administered into her Left deltoid. Patient tolerated the injection well and did not show any signs of distress or voice any concerns.

## 2023-02-05 DIAGNOSIS — C44722 Squamous cell carcinoma of skin of right lower limb, including hip: Secondary | ICD-10-CM | POA: Diagnosis not present

## 2023-02-21 ENCOUNTER — Ambulatory Visit (INDEPENDENT_AMBULATORY_CARE_PROVIDER_SITE_OTHER): Payer: HMO | Admitting: Pulmonary Disease

## 2023-02-21 ENCOUNTER — Encounter: Payer: Self-pay | Admitting: Pulmonary Disease

## 2023-02-21 VITALS — BP 122/78 | HR 63 | Temp 98.0°F | Ht 63.0 in | Wt 135.0 lb

## 2023-02-21 DIAGNOSIS — J3089 Other allergic rhinitis: Secondary | ICD-10-CM

## 2023-02-21 DIAGNOSIS — J302 Other seasonal allergic rhinitis: Secondary | ICD-10-CM

## 2023-02-21 DIAGNOSIS — K219 Gastro-esophageal reflux disease without esophagitis: Secondary | ICD-10-CM

## 2023-02-21 DIAGNOSIS — J45991 Cough variant asthma: Secondary | ICD-10-CM | POA: Diagnosis not present

## 2023-02-21 NOTE — Progress Notes (Addendum)
Subjective:    Patient ID: Nancy Hamilton, female    DOB: [birth year], 77 y.o.   MRN: [id number] Patient Care Team: Sherlene Shams, MD as PCP - General (Internal Medicine) Salena Saner, MD as Consulting Physician (Pulmonary Disease) Chief Complaint  Patient presents with   Follow-up    No SOB or wheezing. Occasional cough when she gets hot or her throat is dry.      HPI This is a 77 year old remote former smoker (quit 1993) who presents for follow-up on the issue of cough variant asthma with a component of LPR trigger.  She was last seen here 18 July 2022, at that time we doing well on her medication regimen.  Since her prior visit we had to switch her Symbicort to Advair due to her insurance company not covering the Symbicort.  She has not had any issues with increasing dyspnea or cough.  She had a positive methacholine test May 2018. Remote smoking history spanned only 11.5 pack years.  No occupational exposures.  She is compliant with PPI and antireflux measures.  LPR/GERD is another trigger for her.  She has not had to use her albuterol rescue inhaler at all since her last visit.   She had PFTs performed on 14 February 2021 and these were normal across the board.  She has not had any fevers, chills or sweats.  No upper respiratory infections of late.  Doing well with Advair.   Overall she feels well and looks well.   Review of Systems A 10 point review of systems was performed and it is as noted above otherwise negative.  Patient Active Problem List   Diagnosis Date Noted   Arthritis of right knee 06/28/2022   Swelling of knee joint 06/28/2022   Sinusitis, acute frontal 01/22/2022   Cerumen impaction 07/12/2021   Thoracic aortic atherosclerosis 12/21/2018   Stress incontinence 03/22/2018   Onychomycosis 03/22/2018   History of colonic polyps    Benign paroxysmal positional vertigo of right ear 11/14/2016   Cervical disc disorder with radiculopathy of cervical region  03/09/2016   Shoulder pain, right 03/09/2016   Statin intolerance 06/08/2014   B12 deficiency 06/07/2014   Iron deficiency 06/07/2014   Adenomatous polyps 02/28/2014   Encounter for preventive health examination 02/20/2013   Generalized anxiety disorder 12/01/2012   GERD (gastroesophageal reflux disease) 12/01/2012   Asthma in adult, mild persistent, uncomplicated 12/01/2012   Hyperlipidemia LDL goal <100 12/01/2012   Social History   Tobacco Use   Smoking status: Former    Packs/day: 0.75    Years: 15.00    Additional pack years: 0.00    Total pack years: 11.25    Types: Cigarettes    Quit date: 08/10/1992    Years since quitting: 30.5   Smokeless tobacco: Never  Substance Use Topics   Alcohol use: No   Allergies  Allergen Reactions   Tylox [Oxycodone-Acetaminophen] Rash   Current Meds  Medication Sig   albuterol (VENTOLIN HFA) 108 (90 Base) MCG/ACT inhaler Inhale 2 puffs into the lungs every 6 (six) hours as needed for wheezing or shortness of breath (cough).   Cholecalciferol 50 MCG (2000 UT) CAPS Take 1 capsule by mouth daily.   fluticasone (FLONASE) 50 MCG/ACT nasal spray PLACE TWO SPRAYS INTO BOTH NOSTRILS DAILY AS DIRECTED   fluticasone-salmeterol (ADVAIR) 250-50 MCG/ACT AEPB Inhale 1 puff into the lungs every 12 (twelve) hours.   levocetirizine (XYZAL) 5 MG tablet TAKE ONE TABLET BY MOUTH EVERY EVENING AS  DIRECTED   meloxicam (MOBIC) 15 MG tablet Take 15 mg by mouth daily.   montelukast (SINGULAIR) 10 MG tablet Take 1 tablet (10 mg total) by mouth daily.   Na Sulfate-K Sulfate-Mg Sulf 17.5-3.13-1.6 GM/177ML SOLN Take by mouth as directed.   pantoprazole (PROTONIX) 40 MG tablet TAKE 1 TABLET(40 MG) BY MOUTH TWICE DAILY   rosuvastatin (CRESTOR) 5 MG tablet TAKE 1 TABLET BY MOUTH DAILY   Spacer/Aero-Holding Chambers (AEROCHAMBER MV) inhaler Use as instructed   Immunization History  Administered Date(s) Administered   Fluad Quad(high Dose 65+) 08/25/2019, 09/06/2020,  08/22/2021, 09/11/2022   Influenza Split 08/10/2012   Influenza, High Dose Seasonal PF 09/14/2016, 09/03/2017, 08/19/2018   Influenza,inj,Quad PF,6+ Mos 08/20/2013, 08/26/2014, 09/06/2015   Moderna Sars-Covid-2 Vaccination 12/22/2019, 01/19/2020, 09/26/2020   Pneumococcal Conjugate-13 02/25/2014   Pneumococcal Polysaccharide-23 02/18/2013, 06/22/2020   Tdap 12/01/2010, 07/18/2021   Zoster Recombinat (Shingrix) 10/23/2019, 02/10/2020   Zoster, Live 10/20/2015       Objective:   Physical Exam BP 122/78 (BP Location: Left Arm, Cuff Size: Normal)   Pulse 63   Temp 98 F (36.7 C)   Ht 5\' 3"  (1.6 m)   Wt 135 lb (61.2 kg)   SpO2 97%   BMI 23.91 kg/m   SpO2: 97 % O2 Device: None (Room air)  GENERAL: Well-developed well-nourished, fit appearing, fully ambulatory, no acute distress. HEAD: Normocephalic, atraumatic.  EYES: Pupils equal, round, reactive to light.  No scleral icterus.  MOUTH: Dentition intact, oral mucosa moist.  No thrush. NECK: Supple. No thyromegaly. Trachea midline. No JVD.  No adenopathy. PULMONARY: Lungs clear to auscultation bilaterally.  Good air entry bilaterally. CARDIOVASCULAR: S1 and S2. Regular rate and rhythm.  No rubs, murmurs or gallops heard. GASTROINTESTINAL: No abdominal distention noted. MUSCULOSKELETAL: No joint deformity, no clubbing, no edema.  NEUROLOGIC: Awake, alert, fully oriented.  No focal deficit noted.  No gait disturbance noted. SKIN: Intact,warm,dry.  On limited exam no rashes. PSYCH: Mood and behavior appropriate.     Assessment & Plan:     ICD-10-CM   1. Cough variant asthma  J45.991    Continue Advair Continue Singulair Continue as needed albuterol    2. Laryngopharyngeal reflux (LPR)  K21.9    Continue antireflux measures Patient is on PPI    3. Perennial allergic rhinitis with seasonal variation  J30.89    J30.2    Well-controlled on Singulair     Overall Nancy Hamilton is doing well.  We will see her in follow-up in 6  months time she is to contact us prior to that time should any new difficulties arise.  [name], MD Advanced Bronchoscopy PCCM [facility]    *This note was dictated using voice recognition software/Dragon.  Despite best efforts to proofread, errors can occur which can change the meaning. Any transcriptional errors that result from this process are unintentional and may not be fully corrected at the time of dictation.

## 2023-02-21 NOTE — Patient Instructions (Signed)
Continue using the Advair 1 puff twice a day, make sure you rinse your mouth well after you use it.  We will see him in follow-up in 6 months time call sooner should any new problems arise.

## 2023-02-25 DIAGNOSIS — M7918 Myalgia, other site: Secondary | ICD-10-CM | POA: Diagnosis not present

## 2023-02-25 DIAGNOSIS — S76312D Strain of muscle, fascia and tendon of the posterior muscle group at thigh level, left thigh, subsequent encounter: Secondary | ICD-10-CM | POA: Diagnosis not present

## 2023-03-05 ENCOUNTER — Ambulatory Visit (INDEPENDENT_AMBULATORY_CARE_PROVIDER_SITE_OTHER): Payer: HMO

## 2023-03-05 DIAGNOSIS — E538 Deficiency of other specified B group vitamins: Secondary | ICD-10-CM | POA: Diagnosis not present

## 2023-03-05 MED ORDER — CYANOCOBALAMIN 1000 MCG/ML IJ SOLN
1000.0000 ug | Freq: Once | INTRAMUSCULAR | Status: AC
  Administered 2023-03-05: 1000 ug via INTRAMUSCULAR

## 2023-03-05 NOTE — Progress Notes (Signed)
After obtaining consent, and per orders of Dr. Darrick Huntsman, injection of B-12 given IM in right deltoid by Valentino Nose. Pt tolerated injection well.

## 2023-03-11 DIAGNOSIS — Z85828 Personal history of other malignant neoplasm of skin: Secondary | ICD-10-CM | POA: Diagnosis not present

## 2023-03-11 DIAGNOSIS — D2362 Other benign neoplasm of skin of left upper limb, including shoulder: Secondary | ICD-10-CM | POA: Diagnosis not present

## 2023-03-11 DIAGNOSIS — S70361A Insect bite (nonvenomous), right thigh, initial encounter: Secondary | ICD-10-CM | POA: Diagnosis not present

## 2023-03-11 DIAGNOSIS — S70362A Insect bite (nonvenomous), left thigh, initial encounter: Secondary | ICD-10-CM | POA: Diagnosis not present

## 2023-03-11 DIAGNOSIS — L570 Actinic keratosis: Secondary | ICD-10-CM | POA: Diagnosis not present

## 2023-03-25 DIAGNOSIS — S76311D Strain of muscle, fascia and tendon of the posterior muscle group at thigh level, right thigh, subsequent encounter: Secondary | ICD-10-CM | POA: Diagnosis not present

## 2023-03-30 ENCOUNTER — Ambulatory Visit
Admission: EM | Admit: 2023-03-30 | Discharge: 2023-03-30 | Disposition: A | Discharge status: Home / Self Care | Payer: HMO | Attending: Emergency Medicine | Admitting: Emergency Medicine

## 2023-03-30 DIAGNOSIS — J0100 Acute maxillary sinusitis, unspecified: Secondary | ICD-10-CM

## 2023-03-30 MED ORDER — AMOXICILLIN 875 MG PO TABS: 875.0000 mg | ORAL_TABLET | Freq: Two times a day (BID) | ORAL | 0 refills | Status: AC

## 2023-03-30 NOTE — ED Triage Notes (Signed)
Patient to Urgent Care with complaints of  sinus pain and pressure. Productive cough with yellow mucus. Denies any known fevers- reports temp 98 last night.    Has been taking delsym/ sudafed/ Allegra-D. Symptoms started Tuesday, worsened today.

## 2023-03-30 NOTE — ED Provider Notes (Signed)
Renaldo Fiddler    CSN: [id number] Arrival date & time: 03/30/23  1222      History   Chief Complaint Chief Complaint  Patient presents with   Nasal Congestion    HPI Nancy Hamilton is a 77 y.o. female.  Patient presents with 6 day history of sinus pressure, congestion, cough; worse today.  Her nasal mucous is yellow.  Treating with allergy and cough medication.  No fever, chest pain, shortness of breath, or other symptoms.    The history is provided by the patient and medical records.    Past Medical History:  Diagnosis Date   Allergy    Anemia    Anxiety    Arthritis    neck, no limitations   Cancer (HCC)    SKIN   Cough    CHRONIC FROM REFLUX   Depression    GERD (gastroesophageal reflux disease)    SLEEPS WITH HOB ELEVATED   Hyperlipidemia     Patient Active Problem List   Diagnosis Date Noted   Arthritis of right knee 06/28/2022   Swelling of knee joint 06/28/2022   Sinusitis, acute frontal 01/22/2022   Cerumen impaction 07/12/2021   Thoracic aortic atherosclerosis (HCC) 12/21/2018   Stress incontinence 03/22/2018   Onychomycosis 03/22/2018   History of colonic polyps    Benign paroxysmal positional vertigo of right ear 11/14/2016   Cervical disc disorder with radiculopathy of cervical region 03/09/2016   Shoulder pain, right 03/09/2016   Statin intolerance 06/08/2014   B12 deficiency 06/07/2014   Iron deficiency 06/07/2014   Adenomatous polyps 02/28/2014   Encounter for preventive health examination 02/20/2013   Generalized anxiety disorder 12/01/2012   GERD (gastroesophageal reflux disease) 12/01/2012   Asthma in adult, mild persistent, uncomplicated 12/01/2012   Hyperlipidemia LDL goal <100 12/01/2012    Past Surgical History:  Procedure Laterality Date   BACK SURGERY  1980   Ruptured Disc   CATARACT EXTRACTION W/PHACO Right 11/28/2015   Procedure: CATARACT EXTRACTION PHACO AND INTRAOCULAR LENS PLACEMENT (IOC);  Surgeon: Sallee Lange, MD;  Location: [facility];  Service: Ophthalmology;  Laterality: Right;  [place] AP% CDE fluid pack lot # [id number]   CATARACT EXTRACTION W/PHACO Left 12/19/2015   Procedure: CATARACT EXTRACTION PHACO AND INTRAOCULAR LENS PLACEMENT (IOC);  Surgeon: Sallee Lange, MD;  Location: [facility];  Service: Ophthalmology;  Laterality: Left;  US:1:08.2 AP%:56.7% CDE:28.10 Lot# [id number]   COLONOSCOPY     COLONOSCOPY WITH PROPOFOL N/A 10/10/2017   Procedure: COLONOSCOPY WITH PROPOFOL;  Surgeon: Midge Minium, MD;  Location: [facility];  Service: Endoscopy;  Laterality: N/A;   COLONOSCOPY WITH PROPOFOL N/A 09/28/2022   Procedure: COLONOSCOPY WITH BIOPSY;  Surgeon: Midge Minium, MD;  Location: [facility];  Service: Endoscopy;  Laterality: N/A;   ESOPHAGOGASTRODUODENOSCOPY (EGD) WITH PROPOFOL N/A 09/28/2022   Procedure: ESOPHAGOGASTRODUODENOSCOPY (EGD) WITH PROPOFOL;  Surgeon: Midge Minium, MD;  Location: [facility];  Service: Endoscopy;  Laterality: N/A;   POLYPECTOMY  10/10/2017   Procedure: POLYPECTOMY;  Surgeon: Midge Minium, MD;  Location: [facility];  Service: Endoscopy;;   POLYPECTOMY N/A 09/28/2022   Procedure: POLYPECTOMY;  Surgeon: Midge Minium, MD;  Location: [facility];  Service: Endoscopy;  Laterality: N/A;    OB History   No obstetric history on file.      Home Medications    Prior to Admission medications   Medication Sig Start Date End Date Taking? Authorizing Provider  amoxicillin (AMOXIL) 875 MG tablet Take 1 tablet (875  mg total) by mouth 2 (two) times daily for 10 days. 03/30/23 04/09/23 Yes Mickie Bail, NP  albuterol (VENTOLIN HFA) 108 (90 Base) MCG/ACT inhaler Inhale 2 puffs into the lungs every 6 (six) hours as needed for wheezing or shortness of breath (cough). 05/10/20   Salena Saner, MD  Ascorbic Acid (VITAMIN C) 1000 MG tablet Take 1,000 mg by mouth daily. Patient not taking: Reported on 02/21/2023    [provider]  Cholecalciferol 50 MCG (2000 UT) CAPS Take 1 capsule by mouth daily.    [provider]  cyanocobalamin (,VITAMIN B-12,) 1000 MCG/ML injection Inject 1 ml intrumuscularly daily x 3, then weekly x 4, then monthly thereafter Patient not taking: Reported on 09/21/2022 06/08/14   Sherlene Shams, MD  FLUoxetine (PROZAC) 20 MG capsule TAKE 1 CAPSULE BY MOUTH EVERY DAY Patient not taking: Reported on 03/30/2023 03/07/22   Sherlene Shams, MD  fluticasone (FLONASE) 50 MCG/ACT nasal spray PLACE TWO SPRAYS INTO BOTH NOSTRILS DAILY AS DIRECTED 06/11/22   Sherlene Shams, MD  fluticasone-salmeterol (ADVAIR) 250-50 MCG/ACT AEPB Inhale 1 puff into the lungs every 12 (twelve) hours. 01/10/23   Salena Saner, MD  hydrocortisone 2.5 % ointment Apply topically. Patient not taking: Reported on 09/21/2022 11/14/21   [provider]  levocetirizine (XYZAL) 5 MG tablet TAKE ONE TABLET BY MOUTH EVERY EVENING AS DIRECTED 12/12/22   Sherlene Shams, MD  meclizine (ANTIVERT) 12.5 MG tablet Take 1 tablet (12.5 mg total) by mouth 3 (three) times daily as needed for dizziness. Patient not taking: Reported on 09/21/2022 11/14/16   Allegra Grana, FNP  meloxicam (MOBIC) 15 MG tablet Take 15 mg by mouth daily.    [provider]  montelukast (SINGULAIR) 10 MG tablet Take 1 tablet (10 mg total) by mouth daily. 04/25/22   Salena Saner, MD  Na Sulfate-K Sulfate-Mg Sulf 17.5-3.13-1.6 GM/177ML SOLN Take by mouth as directed. 09/17/22   [provider]  pantoprazole (PROTONIX) 40 MG tablet TAKE 1 TABLET(40 MG) BY MOUTH TWICE DAILY 01/15/23   Sherlene Shams, MD  rosuvastatin (CRESTOR) 5 MG tablet TAKE 1 TABLET BY MOUTH DAILY 08/23/22   Sherlene Shams, MD  Spacer/Aero-Holding Chambers (AEROCHAMBER MV) inhaler Use as instructed 11/10/19   Salena Saner, MD  Syringe, Disposable, 1 ML MISC For use with B12 injections Patient not taking: Reported on 09/21/2022 06/08/14   Sherlene Shams, MD    Family History Family History  Problem Relation Age of Onset   Hyperlipidemia Sister    Fibromyalgia Sister    Heart disease Brother    Diabetes Brother    Hypertension Brother    Breast cancer Maternal Aunt 41   Diabetes Paternal Grandmother     Social History Social History   Tobacco Use   Smoking status: Former    Packs/day: 0.75    Years: 15.00    Additional pack years: 0.00    Total pack years: 11.25    Types: Cigarettes    Quit date: 08/10/1992    Years since quitting: 30.6   Smokeless tobacco: Never  Vaping Use   Vaping Use: Never used  Substance Use Topics   Alcohol use: No   Drug use: No     Allergies   Tylox [oxycodone-acetaminophen]   Review of Systems Review of Systems  Constitutional:  Negative for chills and fever.  HENT:  Positive for congestion, postnasal drip, rhinorrhea, sinus pressure and sore throat. Negative for  ear pain.   Respiratory:  Positive for cough. Negative for shortness of breath.   Cardiovascular:  Negative for chest pain and palpitations.     Physical Exam Triage Vital Signs ED Triage Vitals  Enc Vitals Group     BP 03/30/23 1232 126/77     Pulse Rate 03/30/23 1232 88     Resp 03/30/23 1232 18     Temp 03/30/23 1232 98.4 F (36.9 C)     Temp src --      SpO2 03/30/23 1232 95 %     Weight --      Height --      Head Circumference --      Peak Flow --      Pain Score 03/30/23 1229 8     Pain Loc --      Pain Edu? --      Excl. in GC? --    No data found.  Updated Vital Signs BP 126/77   Pulse 88   Temp 98.4 F (36.9 C)   Resp 18   SpO2 95%   Visual Acuity Right Eye Distance:   Left Eye Distance:   Bilateral Distance:    Right Eye Near:   Left Eye Near:    Bilateral Near:     Physical Exam Vitals and nursing note reviewed.  Constitutional:      General: She is not in acute distress.    Appearance: Normal appearance. She is well-developed. She is not ill-appearing.  HENT:      Right Ear: Tympanic membrane normal.     Left Ear: Tympanic membrane normal.     Nose: Congestion present.     Mouth/Throat:     Mouth: Mucous membranes are moist.     Pharynx: Oropharynx is clear.  Cardiovascular:     Rate and Rhythm: Normal rate and regular rhythm.     Heart sounds: Normal heart sounds.  Pulmonary:     Effort: Pulmonary effort is normal. No respiratory distress.     Breath sounds: Normal breath sounds.  Musculoskeletal:     Cervical back: Neck supple.  Skin:    General: Skin is warm and dry.  Neurological:     Mental Status: She is alert.  Psychiatric:        Mood and Affect: Mood normal.        Behavior: Behavior normal.      UC Treatments / Results  Labs (all labs ordered are listed, but only abnormal results are displayed) Labs Reviewed - No data to display  EKG   Radiology No results found.  Procedures Procedures (including critical care time)  Medications Ordered in UC Medications - No data to display  Initial Impression / Assessment and Plan / UC Course  I have reviewed the triage vital signs and the nursing notes.  Pertinent labs & imaging results that were available during my care of the patient were reviewed by me and considered in my medical decision making (see chart for details).    Acute sinusitis.  Treating with amoxicillin. Discussed symptomatic treatment including Tylenol as needed.  Instructed patient to follow up with her PCP if her symptoms are not improving.  She agrees to plan of care.   Final Clinical Impressions(s) / UC Diagnoses   Final diagnoses:  Acute non-recurrent maxillary sinusitis     Discharge Instructions      Take the amoxicillin as directed.  Follow up with your primary care provider if your symptoms are not  improving.        ED Prescriptions     Medication Sig Dispense Auth. Provider   amoxicillin (AMOXIL) 875 MG tablet Take 1 tablet (875 mg total) by mouth 2 (two) times daily for 10 days. 20  tablet Mickie Bail, NP      PDMP not reviewed this encounter.   Mickie Bail, NP 03/30/23 1257

## 2023-03-30 NOTE — Discharge Instructions (Addendum)
Take the amoxicillin as directed.  Follow up with your primary care provider if your symptoms are not improving.   ° ° °

## 2023-04-09 ENCOUNTER — Ambulatory Visit (INDEPENDENT_AMBULATORY_CARE_PROVIDER_SITE_OTHER): Payer: HMO

## 2023-04-09 DIAGNOSIS — E538 Deficiency of other specified B group vitamins: Secondary | ICD-10-CM

## 2023-04-09 MED ORDER — CYANOCOBALAMIN 1000 MCG/ML IJ SOLN
1000.0000 ug | Freq: Once | INTRAMUSCULAR | Status: AC
  Administered 2023-04-09: 1000 ug via INTRAMUSCULAR

## 2023-04-09 NOTE — Progress Notes (Signed)
After obtaining consent, and per orders of Dr. Darrick Huntsman, injection of B-12 given IM in left deltoid by Valentino Nose. Patient tolerated injection well.

## 2023-04-15 ENCOUNTER — Other Ambulatory Visit: Payer: Self-pay | Admitting: Pulmonary Disease

## 2023-04-22 ENCOUNTER — Other Ambulatory Visit: Payer: Self-pay | Admitting: Internal Medicine

## 2023-04-24 DIAGNOSIS — S76311D Strain of muscle, fascia and tendon of the posterior muscle group at thigh level, right thigh, subsequent encounter: Secondary | ICD-10-CM | POA: Diagnosis not present

## 2023-04-24 DIAGNOSIS — X32XXXA Exposure to sunlight, initial encounter: Secondary | ICD-10-CM | POA: Diagnosis not present

## 2023-04-24 DIAGNOSIS — L578 Other skin changes due to chronic exposure to nonionizing radiation: Secondary | ICD-10-CM | POA: Diagnosis not present

## 2023-04-24 DIAGNOSIS — Z08 Encounter for follow-up examination after completed treatment for malignant neoplasm: Secondary | ICD-10-CM | POA: Diagnosis not present

## 2023-04-24 DIAGNOSIS — Z85828 Personal history of other malignant neoplasm of skin: Secondary | ICD-10-CM | POA: Diagnosis not present

## 2023-04-24 DIAGNOSIS — D485 Neoplasm of uncertain behavior of skin: Secondary | ICD-10-CM | POA: Diagnosis not present

## 2023-04-24 DIAGNOSIS — L570 Actinic keratosis: Secondary | ICD-10-CM | POA: Diagnosis not present

## 2023-04-24 DIAGNOSIS — L439 Lichen planus, unspecified: Secondary | ICD-10-CM | POA: Diagnosis not present

## 2023-05-14 ENCOUNTER — Ambulatory Visit (INDEPENDENT_AMBULATORY_CARE_PROVIDER_SITE_OTHER): Payer: HMO

## 2023-05-14 ENCOUNTER — Ambulatory Visit: Payer: HMO

## 2023-05-14 DIAGNOSIS — E538 Deficiency of other specified B group vitamins: Secondary | ICD-10-CM | POA: Diagnosis not present

## 2023-05-14 MED ORDER — CYANOCOBALAMIN 1000 MCG/ML IJ SOLN
1000.0000 ug | Freq: Once | INTRAMUSCULAR | Status: AC
  Administered 2023-05-14: 1000 ug via INTRAMUSCULAR

## 2023-05-14 NOTE — Progress Notes (Signed)
Patient presented for B 12 injection to right deltoid, patient voiced no concerns nor showed any signs of distress during injection. 

## 2023-06-10 ENCOUNTER — Ambulatory Visit
Admission: RE | Admit: 2023-06-10 | Discharge: 2023-06-10 | Disposition: A | Discharge status: Home / Self Care | Payer: HMO | Source: Ambulatory Visit | Attending: Internal Medicine | Admitting: Internal Medicine

## 2023-06-10 DIAGNOSIS — Z1231 Encounter for screening mammogram for malignant neoplasm of breast: Secondary | ICD-10-CM | POA: Insufficient documentation

## 2023-06-18 ENCOUNTER — Ambulatory Visit (INDEPENDENT_AMBULATORY_CARE_PROVIDER_SITE_OTHER): Payer: HMO

## 2023-06-18 DIAGNOSIS — E538 Deficiency of other specified B group vitamins: Secondary | ICD-10-CM | POA: Diagnosis not present

## 2023-06-18 MED ORDER — CYANOCOBALAMIN 1000 MCG/ML IJ SOLN
1000.0000 ug | Freq: Once | INTRAMUSCULAR | Status: AC
  Administered 2023-06-18: 1000 ug via INTRAMUSCULAR

## 2023-06-18 NOTE — Progress Notes (Deleted)
Patient presented for B 12 injection to left deltoid, patient voiced no concerns nor showed any signs of distress during injection. 

## 2023-06-18 NOTE — Progress Notes (Signed)
Patient presented for B 12 injection to left deltoid, patient voiced no concerns nor showed any signs of distress during injection. 

## 2023-07-18 ENCOUNTER — Other Ambulatory Visit: Payer: Self-pay | Admitting: Internal Medicine

## 2023-07-19 ENCOUNTER — Encounter: Payer: Self-pay | Admitting: Nurse Practitioner

## 2023-07-19 ENCOUNTER — Ambulatory Visit (INDEPENDENT_AMBULATORY_CARE_PROVIDER_SITE_OTHER): Payer: HMO | Admitting: Nurse Practitioner

## 2023-07-19 VITALS — BP 129/76 | HR 77 | Temp 98.5°F | Ht 63.0 in | Wt 128.2 lb

## 2023-07-19 DIAGNOSIS — J029 Acute pharyngitis, unspecified: Secondary | ICD-10-CM | POA: Diagnosis not present

## 2023-07-19 LAB — POCT RAPID STREP A (OFFICE): Rapid Strep A Screen: NEGATIVE

## 2023-07-19 LAB — POC COVID19 BINAXNOW: SARS Coronavirus 2 Ag: NEGATIVE

## 2023-07-19 NOTE — Progress Notes (Signed)
Bethanie Dicker, NP-C Phone: [phone]  Nancy Hamilton is a 77 y.o. female who presents today for sore throat and drainage.   Patient with symptoms that started yesterday. She denies pain with swallowing. She has been getting good relief in her symptoms with Zyrtec-D.  Respiratory illness:  Cough- No  Congestion-    Sinus- Yes   Chest- No  Post nasal drip- Yes  Sore throat- Yes, scratchy/irritated  Shortness of breath- No  Fever- No  Fatigue/Myalgia- Yes Headache- No Nausea/Vomiting- No Taste disturbance- No  Smell disturbance- No  Covid exposure- No  Covid vaccination- x 3  Flu vaccination- Not due  Medications- Zyrtec-D  Social History   Tobacco Use  Smoking Status Former   Current packs/day: 0.00   Average packs/day: 0.8 packs/day for 15.0 years (11.3 ttl pk-yrs)   Types: Cigarettes   Start date: 08/10/1977   Quit date: 08/10/1992   Years since quitting: 30.9  Smokeless Tobacco Never    Current Outpatient Medications on File Prior to Visit  Medication Sig Dispense Refill   albuterol (VENTOLIN HFA) 108 (90 Base) MCG/ACT inhaler Inhale 2 puffs into the lungs every 6 (six) hours as needed for wheezing or shortness of breath (cough). 18 g 2   Cholecalciferol 50 MCG (2000 UT) CAPS Take 1 capsule by mouth daily.     FLUoxetine (PROZAC) 20 MG capsule TAKE 1 CAPSULE BY MOUTH EVERY DAY (Patient not taking: Reported on 03/30/2023) 90 capsule 1   fluticasone (FLONASE) 50 MCG/ACT nasal spray PLACE TWO SPRAYS INTO BOTH NOSTRILS DAILY AS DIRECTED 48 g 1   fluticasone-salmeterol (ADVAIR) 250-50 MCG/ACT AEPB Inhale 1 puff into the lungs every 12 (twelve) hours. 180 each 3   levocetirizine (XYZAL) 5 MG tablet TAKE ONE TABLET BY MOUTH EVERY EVENING AS DIRECTED 90 tablet 1   meloxicam (MOBIC) 15 MG tablet Take 15 mg by mouth daily.     montelukast (SINGULAIR) 10 MG tablet TAKE ONE TABLET BY MOUTH DAILY 90 tablet 3   Na Sulfate-K Sulfate-Mg Sulf 17.5-3.13-1.6 GM/177ML SOLN Take by  mouth as directed.     pantoprazole (PROTONIX) 40 MG tablet TAKE ONE TABLET BY MOUTH TWICE DAILY 180 tablet 1   rosuvastatin (CRESTOR) 5 MG tablet TAKE 1 TABLET BY MOUTH DAILY 90 tablet 3   Spacer/Aero-Holding Chambers (AEROCHAMBER MV) inhaler Use as instructed 1 each 0   No current facility-administered medications on file prior to visit.    ROS see history of present illness  Objective  Physical Exam Vitals:   07/19/23 0828  BP: 129/76  Pulse: 77  Temp: 98.5 F (36.9 C)  SpO2: 99%    BP Readings from Last 3 Encounters:  07/19/23 129/76  03/30/23 126/77  02/21/23 122/78   Wt Readings from Last 3 Encounters:  07/19/23 128 lb 3.2 oz (58.2 kg)  02/21/23 135 lb (61.2 kg)  12/31/22 134 lb (60.8 kg)    Physical Exam Constitutional:      General: She is not in acute distress.    Appearance: Normal appearance.  HENT:     Head: Normocephalic.     Right Ear: Tympanic membrane normal.     Left Ear: Tympanic membrane normal.     Nose: Nose normal.     Mouth/Throat:     Mouth: Mucous membranes are moist.     Pharynx: Oropharynx is clear. No pharyngeal swelling, oropharyngeal exudate or posterior oropharyngeal erythema.  Eyes:     Conjunctiva/sclera: Conjunctivae normal.     Pupils: Pupils are equal,  round, and reactive to light.  Cardiovascular:     Rate and Rhythm: Normal rate and regular rhythm.     Heart sounds: Normal heart sounds.  Pulmonary:     Effort: Pulmonary effort is normal.     Breath sounds: Normal breath sounds.  Abdominal:     General: Abdomen is flat. Bowel sounds are normal.     Palpations: Abdomen is soft. There is no mass.     Tenderness: There is no abdominal tenderness.  Lymphadenopathy:     Cervical: No cervical adenopathy.  Skin:    General: Skin is warm and dry.  Neurological:     General: No focal deficit present.     Mental Status: She is alert.  Psychiatric:        Mood and Affect: Mood normal.        Behavior: Behavior normal.     Assessment/Plan: Please see individual problem list.  Sore throat Assessment & Plan: Irritated from drainage. No erythema, exudate or swelling noted on exam. Afebrile. COVID and Strep test in office both negative. Likely viral. Patient with symptoms x 1 day. She has been getting good relief with Zyrtec-D, advised she can continue taking as needed or switch to Mucinex-D. Encouraged adequate fluid intake. She will contact if her symptoms are worsening or changing. Return precautions given to patient.   Orders: -     POC COVID-19 BinaxNow -     POCT rapid strep A   Return if symptoms worsen or fail to improve.   Bethanie Dicker, NP-C [facility] - [employer]

## 2023-07-19 NOTE — Assessment & Plan Note (Addendum)
No erythema, exudate or swelling noted on exam. Afebrile. COVID and Strep test in office both negative. Likely viral, irritation caused by drainage. Patient with symptoms x 1 day. She has been getting good relief with Zyrtec-D, advised she can continue taking as needed or switch to Mucinex-D. Encouraged adequate fluid intake. She will contact if her symptoms are worsening or changing. Return precautions given to patient.

## 2023-07-22 ENCOUNTER — Encounter: Payer: Self-pay | Admitting: Internal Medicine

## 2023-07-22 ENCOUNTER — Ambulatory Visit (INDEPENDENT_AMBULATORY_CARE_PROVIDER_SITE_OTHER): Payer: HMO | Admitting: Internal Medicine

## 2023-07-22 VITALS — BP 128/68 | HR 77 | Temp 97.6°F | Ht 63.0 in | Wt 126.6 lb

## 2023-07-22 DIAGNOSIS — I700 Atherosclerosis of aorta: Secondary | ICD-10-CM | POA: Diagnosis not present

## 2023-07-22 DIAGNOSIS — Z0001 Encounter for general adult medical examination with abnormal findings: Secondary | ICD-10-CM

## 2023-07-22 DIAGNOSIS — M8580 Other specified disorders of bone density and structure, unspecified site: Secondary | ICD-10-CM | POA: Diagnosis not present

## 2023-07-22 DIAGNOSIS — S76312A Strain of muscle, fascia and tendon of the posterior muscle group at thigh level, left thigh, initial encounter: Secondary | ICD-10-CM

## 2023-07-22 DIAGNOSIS — E785 Hyperlipidemia, unspecified: Secondary | ICD-10-CM | POA: Diagnosis not present

## 2023-07-22 DIAGNOSIS — J029 Acute pharyngitis, unspecified: Secondary | ICD-10-CM

## 2023-07-22 DIAGNOSIS — E611 Iron deficiency: Secondary | ICD-10-CM | POA: Diagnosis not present

## 2023-07-22 DIAGNOSIS — Z780 Asymptomatic menopausal state: Secondary | ICD-10-CM

## 2023-07-22 DIAGNOSIS — E538 Deficiency of other specified B group vitamins: Secondary | ICD-10-CM

## 2023-07-22 DIAGNOSIS — S76312S Strain of muscle, fascia and tendon of the posterior muscle group at thigh level, left thigh, sequela: Secondary | ICD-10-CM | POA: Diagnosis not present

## 2023-07-22 DIAGNOSIS — R7301 Impaired fasting glucose: Secondary | ICD-10-CM | POA: Diagnosis not present

## 2023-07-22 DIAGNOSIS — Z0000 Encounter for general adult medical examination without abnormal findings: Secondary | ICD-10-CM

## 2023-07-22 DIAGNOSIS — Z79899 Other long term (current) drug therapy: Secondary | ICD-10-CM | POA: Diagnosis not present

## 2023-07-22 DIAGNOSIS — J069 Acute upper respiratory infection, unspecified: Secondary | ICD-10-CM

## 2023-07-22 HISTORY — DX: Strain of muscle, fascia and tendon of the posterior muscle group at thigh level, left thigh, initial encounter: S76.312A

## 2023-07-22 LAB — CBC WITH DIFFERENTIAL/PLATELET
Basophils Absolute: 0 10*3/uL (ref 0.0–0.1)
Basophils Relative: 0.5 % (ref 0.0–3.0)
Eosinophils Absolute: 0.1 10*3/uL (ref 0.0–0.7)
Eosinophils Relative: 2.3 % (ref 0.0–5.0)
HCT: 40.3 % (ref 36.0–46.0)
Hemoglobin: 13.3 g/dL (ref 12.0–15.0)
Lymphocytes Relative: 23.6 % (ref 12.0–46.0)
Lymphs Abs: 1 10*3/uL (ref 0.7–4.0)
MCHC: 32.9 g/dL (ref 30.0–36.0)
MCV: 86.3 fl (ref 78.0–100.0)
Monocytes Absolute: 0.5 10*3/uL (ref 0.1–1.0)
Monocytes Relative: 11.2 % (ref 3.0–12.0)
Neutro Abs: 2.6 10*3/uL (ref 1.4–7.7)
Neutrophils Relative %: 62.4 % (ref 43.0–77.0)
Platelets: 195 10*3/uL (ref 150.0–400.0)
RBC: 4.67 Mil/uL (ref 3.87–5.11)
RDW: 14.7 % (ref 11.5–15.5)
WBC: 4.2 10*3/uL (ref 4.0–10.5)

## 2023-07-22 LAB — COMPREHENSIVE METABOLIC PANEL
ALT: 10 U/L (ref 0–35)
AST: 13 U/L (ref 0–37)
Albumin: 4.2 g/dL (ref 3.5–5.2)
Alkaline Phosphatase: 77 U/L (ref 39–117)
BUN: 15 mg/dL (ref 6–23)
CO2: 30 meq/L (ref 19–32)
Calcium: 9.4 mg/dL (ref 8.4–10.5)
Chloride: 100 meq/L (ref 96–112)
Creatinine, Ser: 0.94 mg/dL (ref 0.40–1.20)
GFR: 59.06 mL/min — ABNORMAL LOW (ref >60.00)
Glucose, Bld: 92 mg/dL (ref 70–99)
Potassium: 4.6 meq/L (ref 3.5–5.1)
Sodium: 136 meq/L (ref 135–145)
Total Bilirubin: 0.6 mg/dL (ref 0.2–1.2)
Total Protein: 7 g/dL (ref 6.0–8.3)

## 2023-07-22 LAB — HEMOGLOBIN A1C: Hgb A1c MFr Bld: 6.2 % (ref 4.6–6.5)

## 2023-07-22 LAB — LIPID PANEL
Cholesterol: 169 mg/dL (ref 0–200)
HDL: 83.3 mg/dL (ref >39.00)
LDL Cholesterol: 71 mg/dL (ref 0–99)
NonHDL: 85.69
Total CHOL/HDL Ratio: 2
Triglycerides: 71 mg/dL (ref 0.0–149.0)
VLDL: 14.2 mg/dL (ref 0.0–40.0)

## 2023-07-22 LAB — TSH: TSH: 1.75 u[IU]/mL (ref 0.35–5.50)

## 2023-07-22 LAB — LDL CHOLESTEROL, DIRECT: Direct LDL: 97 mg/dL

## 2023-07-22 MED ORDER — ROSUVASTATIN CALCIUM 5 MG PO TABS: ORAL_TABLET | ORAL | 3 refills | Status: DC

## 2023-07-22 MED ORDER — AZITHROMYCIN 500 MG PO TABS: 500.0000 mg | ORAL_TABLET | Freq: Every day | ORAL | 0 refills | Status: DC

## 2023-07-22 MED ORDER — PREDNISONE 10 MG PO TABS: ORAL_TABLET | ORAL | 0 refills | Status: DC

## 2023-07-22 MED ORDER — CYANOCOBALAMIN 1000 MCG/ML IJ SOLN
1000.0000 ug | Freq: Once | INTRAMUSCULAR | Status: AC
Start: 2023-07-22 — End: 2023-07-22
  Administered 2023-07-22: 1000 ug via INTRAMUSCULAR

## 2023-07-22 NOTE — Assessment & Plan Note (Signed)
Noted on chest x ray. And reviewed with patient today.  She is tolerating rosuvastatin as preventive ; LDL now < 100  Lab Results  Component Value Date   CHOL 166 07/18/2022   HDL 81.00 07/18/2022   LDLCALC 72 07/18/2022   LDLDIRECT 71.0 07/18/2022   TRIG 67.0 07/18/2022   CHOLHDL 2 07/18/2022

## 2023-07-22 NOTE — Assessment & Plan Note (Signed)
Treated in March/April   with meloxicam and PT exercises by [facility] .  She has stopped the meloxicam, but continues to walk , do exercises,  and  pre and post stretching

## 2023-07-22 NOTE — Patient Instructions (Signed)
  Your covid 19 test was negative  Start the prednisone taper for the cough.  Add the azithromycin if you are not improving by Day 4   Daily use of a probiotic advised for 3 weeks if you start the azithromycin.

## 2023-07-22 NOTE — Assessment & Plan Note (Addendum)
Repeat COVID test was done today  on Day 5 of symptoms.  and  was negative.   prednisone taper given  advised to add azihromycin if sinus pain develops

## 2023-07-22 NOTE — Assessment & Plan Note (Signed)
Exam is normal amd prior Strep screen was negatve

## 2023-07-22 NOTE — Progress Notes (Signed)
Patient ID: Nancy Hamilton, female    DOB: 22-May-1946  Age: 77 y.o. MRN: [id number]  The patient is here for annual preventive examination and management of other chronic and acute problems.   The risk factors are reflected in the social history.  The roster of all physicians providing medical care to patient - is listed in the Snapshot section of the chart.  Activities of daily living:  The patient is 100% independent in all ADLs: dressing, toileting, feeding as well as independent mobility  Home safety : The patient has smoke detectors in the home. They wear seatbelts.  There are no firearms at home. There is no violence in the home.   There is no risks for hepatitis, STDs or HIV. There is no   history of blood transfusion. They have no travel history to infectious disease endemic areas of the world.  The patient has seen their dentist in the last six month. They have seen their eye doctor in the last year. They admit to slight hearing difficulty with regard to whispered voices and some television programs.  They have deferred audiologic testing in the last year.  They do not  have excessive sun exposure. Discussed the need for sun protection: hats, long sleeves and use of sunscreen if there is significant sun exposure.   Diet: the importance of a healthy diet is discussed. They do have a healthy diet.  The benefits of regular aerobic exercise were discussed. She walks 4 to 5 times per week ,  for 45 minutes.   Depression screen: there are no signs or vegative symptoms of depression- irritability, change in appetite, anhedonia, sadness/tearfullness.  Cognitive assessment: the patient manages all their financial and personal affairs and is actively engaged. They could relate day,date,year and events; recalled 2/3 objects at 3 minutes; performed clock-face test normally.  The following portions of the patient's history were reviewed and updated as appropriate: allergies, current medications,  past family history, past medical history,  past surgical history, past social history  and problem list.  Visual acuity was not assessed per patient preference since she has regular follow up with her ophthalmologist. Hearing and body mass index were assessed and reviewed.   During the course of the visit the patient was educated and counseled about appropriate screening and preventive services including : fall prevention , diabetes screening, nutrition counseling, colorectal cancer screening, and recommended immunizations.    CC: The primary encounter diagnosis was Hyperlipidemia LDL goal <100. Diagnoses of Iron deficiency, Long-term use of high-risk medication, Impaired fasting glucose, B12 deficiency, Viral URI with cough, Osteopenia after menopause, Strain of left hamstring muscle, sequela, Thoracic aortic atherosclerosis (HCC), Sore throat, and Encounter for preventive health examination were also pertinent to this visit.  1)  Viral URI: Evaluated for sore throat on Friday  by Arville Lime.  Symptoms of sore throat started one day prior.  Has been having a non productive cough  since then.  COVID and strep were negative at 24 hours.  Has not had a fever,  no body aches,  . No recent travel but had contact with a COVID infected person who had been infected 2 weeks prior      History Nancy Hamilton has a past medical history of Allergy, Anemia, Anxiety, Arthritis, Cancer (HCC), Cough, Depression, GERD (gastroesophageal reflux disease), and Hyperlipidemia.   She has a past surgical history that includes Back surgery (1980); Cataract extraction w/PHACO (Right, 11/28/2015); Colonoscopy; Cataract extraction w/PHACO (Left, 12/19/2015); Colonoscopy with propofol (N/A, 10/10/2017); polypectomy (  10/10/2017); Colonoscopy with propofol (N/A, 09/28/2022); Esophagogastroduodenoscopy (egd) with propofol (N/A, 09/28/2022); and polypectomy (N/A, 09/28/2022).   Her family history includes Breast cancer (age of onset: 79) in her  maternal aunt; Diabetes in her brother and paternal grandmother; Fibromyalgia in her sister; Heart disease in her brother; Hyperlipidemia in her sister; Hypertension in her brother.She reports that she quit smoking about 30 years ago. Her smoking use included cigarettes. She started smoking about 45 years ago. She has a 11.3 pack-year smoking history. She has never used smokeless tobacco. She reports that she does not drink alcohol and does not use drugs.  Outpatient Medications Prior to Visit  Medication Sig Dispense Refill   albuterol (VENTOLIN HFA) 108 (90 Base) MCG/ACT inhaler Inhale 2 puffs into the lungs every 6 (six) hours as needed for wheezing or shortness of breath (cough). 18 g 2   Cholecalciferol 50 MCG (2000 UT) CAPS Take 1 capsule by mouth daily.     fluticasone (FLONASE) 50 MCG/ACT nasal spray PLACE TWO SPRAYS INTO BOTH NOSTRILS DAILY AS DIRECTED 48 g 1   fluticasone-salmeterol (ADVAIR) 250-50 MCG/ACT AEPB Inhale 1 puff into the lungs every 12 (twelve) hours. 180 each 3   levocetirizine (XYZAL) 5 MG tablet TAKE ONE TABLET BY MOUTH EVERY EVENING AS DIRECTED 90 tablet 1   meloxicam (MOBIC) 15 MG tablet Take 15 mg by mouth daily.     montelukast (SINGULAIR) 10 MG tablet TAKE ONE TABLET BY MOUTH DAILY 90 tablet 3   pantoprazole (PROTONIX) 40 MG tablet TAKE ONE TABLET BY MOUTH TWICE DAILY 180 tablet 1   Spacer/Aero-Holding Chambers (AEROCHAMBER MV) inhaler Use as instructed 1 each 0   rosuvastatin (CRESTOR) 5 MG tablet TAKE 1 TABLET BY MOUTH DAILY 90 tablet 3   FLUoxetine (PROZAC) 20 MG capsule TAKE 1 CAPSULE BY MOUTH EVERY DAY (Patient not taking: Reported on 03/30/2023) 90 capsule 1   Na Sulfate-K Sulfate-Mg Sulf 17.5-3.13-1.6 GM/177ML SOLN Take by mouth as directed. (Patient not taking: Reported on 07/22/2023)     No facility-administered medications prior to visit.    Review of Systems  Patient denies headache, fevers, malaise, unintentional weight loss, skin rash, eye pain,  sinus  pain,  dysphagia,  hemoptysis ,  dyspnea, wheezing, chest pain, palpitations, orthopnea, edema, abdominal pain, nausea, melena, diarrhea, constipation, flank pain, dysuria, hematuria, urinary  Frequency, nocturia, numbness, tingling, seizures,  Focal weakness, Loss of consciousness,  Tremor, insomnia, depression, anxiety, and suicidal ideation.     Objective:  BP 128/68   Pulse 77   Temp 97.6 F (36.4 C) (Oral)   Ht 5\' 3"  (1.6 m)   Wt 126 lb 9.6 oz (57.4 kg)   SpO2 97%   BMI 22.43 kg/m   Physical Exam Vitals reviewed.  Constitutional:      General: She is not in acute distress.    Appearance: Normal appearance. She is well-developed and normal weight. She is not ill-appearing, toxic-appearing or diaphoretic.  HENT:     Head: Normocephalic.     Right Ear: Tympanic membrane, ear canal and external ear normal. There is no impacted cerumen.     Left Ear: Tympanic membrane, ear canal and external ear normal. There is no impacted cerumen.     Nose: Nose normal.     Mouth/Throat:     Mouth: Mucous membranes are moist.     Pharynx: Oropharynx is clear.  Eyes:     General: No scleral icterus.       Right eye: No discharge.  Left eye: No discharge.     Conjunctiva/sclera: Conjunctivae normal.     Pupils: Pupils are equal, round, and reactive to light.  Neck:     Thyroid: No thyromegaly.     Vascular: No carotid bruit or JVD.  Cardiovascular:     Rate and Rhythm: Normal rate and regular rhythm.     Heart sounds: Normal heart sounds.  Pulmonary:     Effort: Pulmonary effort is normal. No respiratory distress.     Breath sounds: Normal breath sounds.  Chest:  Breasts:    Breasts are symmetrical.     Right: Normal. No swelling, inverted nipple, mass, nipple discharge, skin change or tenderness.     Left: Normal. No swelling, inverted nipple, mass, nipple discharge, skin change or tenderness.  Abdominal:     General: Bowel sounds are normal.     Palpations: Abdomen is soft.  There is no mass.     Tenderness: There is no abdominal tenderness. There is no guarding or rebound.  Musculoskeletal:        General: Normal range of motion.     Cervical back: Normal range of motion and neck supple.  Lymphadenopathy:     Cervical: No cervical adenopathy.     Upper Body:     Right upper body: No supraclavicular, axillary or pectoral adenopathy.     Left upper body: No supraclavicular, axillary or pectoral adenopathy.  Skin:    General: Skin is warm and dry.  Neurological:     General: No focal deficit present.     Mental Status: She is alert and oriented to person, place, and time. Mental status is at baseline.  Psychiatric:        Mood and Affect: Mood normal.        Behavior: Behavior normal.        Thought Content: Thought content normal.        Judgment: Judgment normal.      Assessment & Plan:  Hyperlipidemia LDL goal <100 Assessment & Plan: LDL and triglycerides are at goal on current medications. sHe has no side effects and liver enzymes are normal. No changes today  Lab Results  Component Value Date   CHOL 169 07/22/2023   HDL 83.30 07/22/2023   LDLCALC 71 07/22/2023   LDLDIRECT 97.0 07/22/2023   TRIG 71.0 07/22/2023   CHOLHDL 2 07/22/2023   Lab Results  Component Value Date   ALT 10 07/22/2023   AST 13 07/22/2023   ALKPHOS 77 07/22/2023   BILITOT 0.6 07/22/2023       Orders: -     Lipid panel -     LDL cholesterol, direct -     Comprehensive metabolic panel  Iron deficiency -     CBC with Differential/Platelet  Long-term use of high-risk medication -     Comprehensive metabolic panel -     TSH  Impaired fasting glucose -     Comprehensive metabolic panel -     Hemoglobin A1c  B12 deficiency -     Cyanocobalamin  Viral URI with cough Assessment & Plan: Repeat COVID test was done today  on Day 5 of symptoms.  and  was negative.   prednisone taper given  advised to add azihromycin if sinus pain develops    Osteopenia  after menopause Assessment & Plan: T score -2.3 in OCt 2023 .  She is taking CalciumVitami nD3 and walking  DAILY FOR EXERCISE.  REPERAT exa IN 2025   Strain of  left hamstring muscle, sequela Assessment & Plan: Treated in March/April   with meloxicam and PT exercises by [facility] .  She has stopped the meloxicam, but continues to walk , do exercises,  and  pre and post stretching    Thoracic aortic atherosclerosis (HCC) Assessment & Plan: Noted on chest x ray. And reviewed with patient today.  She is tolerating rosuvastatin as preventive ; LDL now < 100  Lab Results  Component Value Date   CHOL 166 07/18/2022   HDL 81.00 07/18/2022   LDLCALC 72 07/18/2022   LDLDIRECT 71.0 07/18/2022   TRIG 67.0 07/18/2022   CHOLHDL 2 07/18/2022      Sore throat Assessment & Plan: Exam is normal amd prior Strep screen was negatve    Encounter for preventive health examination Assessment & Plan: age appropriate education and counseling updated, referrals for preventative services and immunizations addressed, dietary and smoking counseling addressed, most recent labs reviewed.  I have personally reviewed and have noted:   1) the patient's medical and social history 2) The pt's use of alcohol, tobacco, and illicit drugs 3) The patient's current medications and supplements 4) Functional ability including ADL's, fall risk, home safety risk, hearing and visual impairment 5) Diet and physical activities 6) Evidence for depression or mood disorder 7) The patient's height, weight, and BMI have been recorded in the chart   I have made referrals, and provided counseling and education based on review of the above    Other orders -     Rosuvastatin Calcium; TAKE 1 TABLET BY MOUTH DAILY  Dispense: 90 tablet; Refill: 3 -     predniSONE; 6 tablets on Day 1 , then reduce by 1 tablet daily until gone  Dispense: 21 tablet; Refill: 0 -     Azithromycin; Take 1 tablet (500 mg total) by mouth daily.   Dispense: 7 tablet; Refill: 0      I provided 40 minutes of  face-to-face time during this encounter reviewing patient's current problems and past surgeries,  recent labs and imaging studies, providing counseling on the above mentioned problems , and coordination  of care .   Follow-up: Return in about 6 months (around 01/19/2024).   Sherlene Shams, MD

## 2023-07-22 NOTE — Assessment & Plan Note (Signed)
LDL and triglycerides are at goal on current medications. sHe has no side effects and liver enzymes are normal. No changes today  Lab Results  Component Value Date   CHOL 169 07/22/2023   HDL 83.30 07/22/2023   LDLCALC 71 07/22/2023   LDLDIRECT 97.0 07/22/2023   TRIG 71.0 07/22/2023   CHOLHDL 2 07/22/2023   Lab Results  Component Value Date   ALT 10 07/22/2023   AST 13 07/22/2023   ALKPHOS 77 07/22/2023   BILITOT 0.6 07/22/2023

## 2023-07-22 NOTE — Assessment & Plan Note (Addendum)
T score -2.3 in OCt 2023 .  She is taking CalciumVitami nD3 and walking  DAILY FOR EXERCISE.  REPERAT exa IN 2025

## 2023-07-22 NOTE — Assessment & Plan Note (Signed)

## 2023-08-07 ENCOUNTER — Other Ambulatory Visit: Payer: Self-pay | Admitting: Internal Medicine

## 2023-08-27 ENCOUNTER — Ambulatory Visit: Payer: HMO

## 2023-08-27 DIAGNOSIS — E538 Deficiency of other specified B group vitamins: Secondary | ICD-10-CM

## 2023-08-27 DIAGNOSIS — Z23 Encounter for immunization: Secondary | ICD-10-CM | POA: Diagnosis not present

## 2023-08-27 MED ORDER — CYANOCOBALAMIN 1000 MCG/ML IJ SOLN
1000.0000 ug | Freq: Once | INTRAMUSCULAR | Status: AC
Start: 2023-08-27 — End: 2023-08-27
  Administered 2023-08-27: 1000 ug via INTRAMUSCULAR

## 2023-08-27 NOTE — Progress Notes (Signed)
Patient presented for a B12 injection and it was administered into her left deltoid. Patient tolerated the injection well.   Patient presented for a high dose flu vaccine and it was administered into her right deltoid. Patient tolerated the vaccine well.

## 2023-09-16 ENCOUNTER — Ambulatory Visit (INDEPENDENT_AMBULATORY_CARE_PROVIDER_SITE_OTHER): Payer: HMO | Admitting: Pulmonary Disease

## 2023-09-16 ENCOUNTER — Encounter: Payer: Self-pay | Admitting: Pulmonary Disease

## 2023-09-16 VITALS — BP 116/68 | HR 68 | Temp 98.4°F | Ht 63.0 in | Wt 128.0 lb

## 2023-09-16 DIAGNOSIS — J329 Chronic sinusitis, unspecified: Secondary | ICD-10-CM | POA: Diagnosis not present

## 2023-09-16 DIAGNOSIS — K219 Gastro-esophageal reflux disease without esophagitis: Secondary | ICD-10-CM | POA: Diagnosis not present

## 2023-09-16 DIAGNOSIS — J45991 Cough variant asthma: Secondary | ICD-10-CM

## 2023-09-16 LAB — NITRIC OXIDE: Nitric Oxide: 14

## 2023-09-16 NOTE — Patient Instructions (Signed)
VISIT SUMMARY:  Today, we discussed your ongoing issues with cough variant asthma, laryngopharyngeal reflux (LPR), and chronic rhinosinusitis. You reported experiencing a dry cough, which you believe is triggered by your asthma and possibly worsened by acid reflux. We also reviewed your recent severe sinus infection, which was successfully treated. Additionally, we talked about your history of ear issues and your current general health maintenance.  YOUR PLAN:  -COUGH VARIANT ASTHMA: Cough variant asthma is a type of asthma where the main symptom is a dry cough. It is important to use your Advair inhaler twice daily as prescribed to control your asthma symptoms and prevent coughing fits.  -LARYNGOPHARYNGEAL REFLUX (LPR): Laryngopharyngeal reflux (LPR) occurs when stomach acid flows back into the throat, causing irritation. To manage your LPR symptoms, please take Protonix twice daily as prescribed.  -CHRONIC RHINOSINUSITIS: Chronic rhinosinusitis is a long-term inflammation of the sinuses. You recently had a severe sinus infection that was treated successfully. We will monitor the inflammation in your airway, and if you experience another severe sinus infection, we will refer you to an ENT specialist.  -GENERAL HEALTH MAINTENANCE: Your flu vaccination is up to date, and you have completed other routine health screenings, including a colonoscopy and endoscopy. Continue with your current health maintenance routine and follow up in six months.  INSTRUCTIONS:  Please follow up in six months for a routine check-up. Make sure to use your Advair inhaler twice daily and take Protonix twice daily as prescribed. If you experience another severe sinus infection, please contact us for a referral to an ENT specialist.

## 2023-09-16 NOTE — Progress Notes (Signed)
Subjective:    Patient ID: Nancy Hamilton, female    DOB: 1946-07-16, 77 y.o.   MRN: [id number]  Patient Care Team: Sherlene Shams, MD as PCP - General (Internal Medicine) Salena Saner, MD as Consulting Physician (Pulmonary Disease)  Chief Complaint  Patient presents with   Follow-up    No SOB or wheezing. Chronic dry cough.    BACKGROUND/INTERVAL: 77 year old remote former smoker (quit 1993) who presents for follow-up on the issue of cough variant asthma with a component of LPR trigger.  Patient was last seen on 21 February 2023.  Recent dry cough however, not using Advair nor PPI as advised.  Having significant LPR/GERD symptoms.  HPI Discussed the use of AI scribe software for clinical note transcription with the patient, who gave verbal consent to proceed.  History of Present Illness   The patient is a 77 year old former smoker who presents for follow-up regarding cough variant asthma with a component of laryngopharyngeal reflux (LPR) and chronic rhinosinusitis. She reports no current shortness of breath. However, she experiences a cough when her throat becomes dry, which she believes is triggered by her asthma and possibly exacerbated by acid reflux. She is currently using Advair as a regular inhaler, but not as frequently as recommended. She also takes medication for acid reflux, which she is reminded to take twice daily.  The patient had a severe sinus infection before Christmas, which she initially feared might be COVID-19 due to exposure to several individuals recovering from the virus. However, testing ruled out COVID-19. The sinus infection was treated with prednisone and an antibiotic, which successfully cleared it up. The patient acknowledges the need to use her inhaler and reflux medication more consistently to manage her asthma and prevent future sinus infections.  The patient also reports that she experiences coughing fits when her throat becomes dry, often requiring  lozenges or similar remedies to manage. She has noticed an increase in reflux symptoms, which she attributes to changes in her diet, specifically the consumption of caffeinated drinks due to the unavailability of her preferred caffeine-free Crystal Light.  The patient has a history of Eustachian tube dysfunction, which was resolved years ago by an ENT specialist. She has not seen an ENT specialist recently but is open to doing so if she experiences another severe sinus infection. She has had her flu shot for the current year and has completed other routine health screenings, including colonoscopy and endoscopy.      Review of Systems A 10 point review of systems was performed and it is as noted above otherwise negative.   Patient Active Problem List   Diagnosis Date Noted   Viral URI with cough 07/22/2023   Osteopenia after menopause 07/22/2023   Left hamstring muscle strain 07/22/2023   Sore throat 07/19/2023   Arthritis of right knee 06/28/2022   Swelling of knee joint 06/28/2022   Cerumen impaction 07/12/2021   Thoracic aortic atherosclerosis (HCC) 12/21/2018   Stress incontinence 03/22/2018   Onychomycosis 03/22/2018   History of colonic polyps    Benign paroxysmal positional vertigo of right ear 11/14/2016   Cervical disc disorder with radiculopathy of cervical region 03/09/2016   Statin intolerance 06/08/2014   B12 deficiency 06/07/2014   Iron deficiency 06/07/2014   Adenomatous polyps 02/28/2014   Encounter for preventive health examination 02/20/2013   Generalized anxiety disorder 12/01/2012   GERD (gastroesophageal reflux disease) 12/01/2012   Asthma in adult, mild persistent, uncomplicated 12/01/2012   Hyperlipidemia LDL goal <100 12/01/2012  Social History   Tobacco Use   Smoking status: Former    Current packs/day: 0.00    Average packs/day: 0.8 packs/day for 15.0 years (11.3 ttl pk-yrs)    Types: Cigarettes    Start date: 08/10/1977    Quit date: 08/10/1992     Years since quitting: 31.1   Smokeless tobacco: Never  Substance Use Topics   Alcohol use: No    Allergies  Allergen Reactions   Tylox [Oxycodone-Acetaminophen] Rash    Current Meds  Medication Sig   albuterol (VENTOLIN HFA) 108 (90 Base) MCG/ACT inhaler Inhale 2 puffs into the lungs every 6 (six) hours as needed for wheezing or shortness of breath (cough).   azithromycin (ZITHROMAX) 500 MG tablet Take 1 tablet (500 mg total) by mouth daily.   Cholecalciferol 50 MCG (2000 UT) CAPS Take 1 capsule by mouth daily.   fluticasone (FLONASE) 50 MCG/ACT nasal spray PLACE TWO SPRAYS INTO BOTH NOSTRILS DAILY AS DIRECTED   fluticasone-salmeterol (ADVAIR) 250-50 MCG/ACT AEPB Inhale 1 puff into the lungs every 12 (twelve) hours.   levocetirizine (XYZAL) 5 MG tablet TAKE ONE TABLET BY MOUTH EVERY EVENING AS DIRECTED   meloxicam (MOBIC) 15 MG tablet Take 15 mg by mouth daily.   montelukast (SINGULAIR) 10 MG tablet TAKE ONE TABLET BY MOUTH DAILY   pantoprazole (PROTONIX) 40 MG tablet TAKE ONE TABLET BY MOUTH TWICE DAILY   predniSONE (DELTASONE) 10 MG tablet 6 tablets on Day 1 , then reduce by 1 tablet daily until gone   rosuvastatin (CRESTOR) 5 MG tablet TAKE 1 TABLET BY MOUTH DAILY   Spacer/Aero-Holding Chambers (AEROCHAMBER MV) inhaler Use as instructed    Immunization History  Administered Date(s) Administered   Fluad Quad(high Dose 65+) 08/25/2019, 09/06/2020, 08/22/2021, 09/11/2022   Fluad Trivalent(High Dose 65+) 08/27/2023   Influenza Split 08/10/2012   Influenza, High Dose Seasonal PF 09/14/2016, 09/03/2017, 08/19/2018   Influenza,inj,Quad PF,6+ Mos 08/20/2013, 08/26/2014, 09/06/2015   Moderna Sars-Covid-2 Vaccination 12/22/2019, 01/19/2020, 09/26/2020   Pneumococcal Conjugate-13 02/25/2014   Pneumococcal Polysaccharide-23 02/18/2013, 06/22/2020   Tdap 12/01/2010, 07/18/2021   Zoster Recombinant(Shingrix) 10/23/2019, 02/10/2020   Zoster, Live 10/20/2015        Objective:     BP  116/68 (BP Location: Right Arm, Cuff Size: Normal)   Pulse 68   Temp 98.4 F (36.9 C)   Ht 5\' 3"  (1.6 m)   Wt 128 lb (58.1 kg)   SpO2 98%   BMI 22.67 kg/m   SpO2: 98 % O2 Device: None (Room air)  GENERAL: Well-developed well-nourished, fit appearing, fully ambulatory, no acute distress. HEAD: Normocephalic, atraumatic.  EYES: Pupils equal, round, reactive to light.  No scleral icterus.  MOUTH: Dentition intact, oral mucosa moist.  No thrush. NECK: Supple. No thyromegaly. Trachea midline. No JVD.  No adenopathy. PULMONARY: Lungs clear to auscultation bilaterally.  Good air entry bilaterally. CARDIOVASCULAR: S1 and S2. Regular rate and rhythm.  No rubs, murmurs or gallops heard. GASTROINTESTINAL: No abdominal distention noted. MUSCULOSKELETAL: No joint deformity, no clubbing, no edema.  NEUROLOGIC: Awake, alert, fully oriented.  No focal deficit noted.  No gait disturbance noted. SKIN: Intact,warm,dry.  On limited exam no rashes. PSYCH: Mood and behavior appropriate.  Lab Results  Component Value Date   NITRICOXIDE 14 09/16/2023  *No evidence of type II inflammation   Assessment & Plan:     ICD-10-CM   1. Cough variant asthma  J45.991 Nitric oxide    2. Laryngopharyngeal reflux (LPR)  K21.9     3.  Chronic rhinosinusitis  J32.9      Orders Placed This Encounter  Procedures   Nitric oxide   Discussion:    Cough Variant Asthma Dry cough likely triggered by asthma and LPR. Patient is not consistently using Advair twice daily. -Advise patient to use Advair twice daily to control asthma symptoms.  Laryngopharyngeal Reflux (LPR) Patient is not consistently taking Protonix twice daily. -Advise patient to take Protonix twice daily to manage LPR symptoms.  Chronic Rhinosinusitis Recent episode of sinusitis treated with prednisone and antibiotics. Patient has a history of severe sinus infections. -Plan to check inflammation in airway. -If another severe sinusitis episode  occurs, refer to ENT specialist.  General Health Maintenance -Flu vaccination is up to date. -Follow-up in six months.  Call sooner should any new problems arise.     [name], MD Advanced Bronchoscopy P[facility]    *This note was generated using voice recognition software/Dragon and/or AI transcription program.  Despite best efforts to proofread, errors can occur which can change the meaning. Any transcriptional errors that result from this process are unintentional and may not be fully corrected at the time of dictation.

## 2023-10-01 ENCOUNTER — Ambulatory Visit: Payer: HMO

## 2023-10-04 ENCOUNTER — Ambulatory Visit (INDEPENDENT_AMBULATORY_CARE_PROVIDER_SITE_OTHER): Payer: HMO | Admitting: *Deleted

## 2023-10-04 DIAGNOSIS — E538 Deficiency of other specified B group vitamins: Secondary | ICD-10-CM | POA: Diagnosis not present

## 2023-10-04 MED ORDER — CYANOCOBALAMIN 1000 MCG/ML IJ SOLN
1000.0000 ug | Freq: Once | INTRAMUSCULAR | Status: AC
Start: 2023-10-04 — End: 2023-10-04
  Administered 2023-10-04: 1000 ug via INTRAMUSCULAR

## 2023-10-04 NOTE — Progress Notes (Signed)
Pt received B12 injection in right deltoid muscle. Pt tolerated it well with no complaints or concerns.  

## 2023-10-22 ENCOUNTER — Other Ambulatory Visit: Payer: Self-pay | Admitting: Internal Medicine

## 2023-11-04 ENCOUNTER — Ambulatory Visit (INDEPENDENT_AMBULATORY_CARE_PROVIDER_SITE_OTHER): Payer: HMO

## 2023-11-04 ENCOUNTER — Ambulatory Visit: Payer: HMO

## 2023-11-04 DIAGNOSIS — D3131 Benign neoplasm of right choroid: Secondary | ICD-10-CM | POA: Diagnosis not present

## 2023-11-04 DIAGNOSIS — E538 Deficiency of other specified B group vitamins: Secondary | ICD-10-CM | POA: Diagnosis not present

## 2023-11-04 MED ORDER — CYANOCOBALAMIN 1000 MCG/ML IJ SOLN
1000.0000 ug | Freq: Once | INTRAMUSCULAR | Status: AC
Start: 2023-11-04 — End: 2023-11-04
  Administered 2023-11-04: 1000 ug via INTRAMUSCULAR

## 2023-11-04 NOTE — Progress Notes (Signed)
Pt presented for their vitamin B12 injection. Pt was identified through two identifiers. Pt tolerated shot well in their left  deltoid.  

## 2023-11-19 ENCOUNTER — Other Ambulatory Visit: Payer: Self-pay | Admitting: Pulmonary Disease

## 2023-12-03 ENCOUNTER — Ambulatory Visit (INDEPENDENT_AMBULATORY_CARE_PROVIDER_SITE_OTHER): Payer: HMO

## 2023-12-03 DIAGNOSIS — E538 Deficiency of other specified B group vitamins: Secondary | ICD-10-CM

## 2023-12-03 MED ORDER — CYANOCOBALAMIN 1000 MCG/ML IJ SOLN
1000.0000 ug | Freq: Once | INTRAMUSCULAR | Status: AC
  Administered 2023-12-03: 1000 ug via INTRAMUSCULAR

## 2023-12-03 NOTE — Progress Notes (Signed)
Patient presented for B 12 injection to right deltoid, patient voiced no concerns nor showed any signs of distress during injection. 

## 2023-12-16 DIAGNOSIS — L570 Actinic keratosis: Secondary | ICD-10-CM | POA: Diagnosis not present

## 2023-12-16 DIAGNOSIS — C44729 Squamous cell carcinoma of skin of left lower limb, including hip: Secondary | ICD-10-CM | POA: Diagnosis not present

## 2023-12-16 DIAGNOSIS — D485 Neoplasm of uncertain behavior of skin: Secondary | ICD-10-CM | POA: Diagnosis not present

## 2023-12-16 DIAGNOSIS — D225 Melanocytic nevi of trunk: Secondary | ICD-10-CM | POA: Diagnosis not present

## 2023-12-16 DIAGNOSIS — Z85828 Personal history of other malignant neoplasm of skin: Secondary | ICD-10-CM | POA: Diagnosis not present

## 2023-12-16 DIAGNOSIS — D2261 Melanocytic nevi of right upper limb, including shoulder: Secondary | ICD-10-CM | POA: Diagnosis not present

## 2023-12-16 DIAGNOSIS — L720 Epidermal cyst: Secondary | ICD-10-CM | POA: Diagnosis not present

## 2023-12-16 DIAGNOSIS — D2272 Melanocytic nevi of left lower limb, including hip: Secondary | ICD-10-CM | POA: Diagnosis not present

## 2023-12-16 DIAGNOSIS — D2262 Melanocytic nevi of left upper limb, including shoulder: Secondary | ICD-10-CM | POA: Diagnosis not present

## 2023-12-30 ENCOUNTER — Telehealth: Payer: Self-pay

## 2023-12-30 NOTE — Telephone Encounter (Signed)
 Reason for CRM: Patient called wanted to cancel AWV fro 2/19 due to possible weather. She was scheduled for an in person visit for her AWV Medicare. Would like to know if possible to be reschedule for 2/25 before her b12 injection. Did not show any on schedule until September. Thank You   I left a voicemail for patient asking her to please call us back.  When patient calls back, please reschedule her Medicare AWV.  We have availability on 12/31/2023 and in March, 2025, and going forward.

## 2023-12-30 NOTE — Telephone Encounter (Signed)
 Copied from CRM [id number]. Topic: Appointments - Scheduling Inquiry for Clinic >> Dec 30, 2023 10:38 AM Fuller Mandril wrote: Reason for CRM: Patient called wanted to cancel AWV fro 2/19 due to possible weather. She was scheduled for an in person visit for her AWV Medicare. Would like to know if possible to be reschedule for 2/25 before her b12 injection. Did not show any on schedule until September. Thank You >> Dec 30, 2023 12:25 PM Colin Broach wrote: Patient has been called and appointment changed to a telephone

## 2024-01-01 ENCOUNTER — Ambulatory Visit (INDEPENDENT_AMBULATORY_CARE_PROVIDER_SITE_OTHER): Payer: HMO

## 2024-01-01 VITALS — Ht 63.0 in | Wt 127.0 lb

## 2024-01-01 DIAGNOSIS — Z0000 Encounter for general adult medical examination without abnormal findings: Secondary | ICD-10-CM

## 2024-01-01 NOTE — Progress Notes (Signed)
 Subjective:   Nancy Hamilton is a 78 y.o. female who presents for Medicare Annual (Subsequent) preventive examination.  Visit Complete: Virtual I connected with  Nancy Hamilton on 01/01/24 by a audio enabled telemedicine application and verified that I am speaking with the correct person using two identifiers.This patient declined Interactive audio and Acupuncturist. Therefore the visit was completed with audio only.   Patient Location: Home  Provider Location: Home Office  I discussed the limitations of evaluation and management by telemedicine. The patient expressed understanding and agreed to proceed.  Vital Signs: Because this visit was a virtual/telehealth visit, some criteria may be missing or patient reported. Any vitals not documented were not able to be obtained and vitals that have been documented are patient reported.  Cardiac Risk Factors include: advanced age (>3men, >10 women);dyslipidemia     Objective:    Today's Vitals   01/01/24 1016  Weight: 127 lb (57.6 kg)  Height: 5\' 3"  (1.6 m)   Body mass index is 22.5 kg/m.     01/01/2024   10:34 AM 12/31/2022   10:25 AM 12/07/2021   10:03 AM 12/06/2020   10:16 AM 12/04/2019    9:42 AM 06/26/2018    8:12 AM 10/10/2017    7:07 AM  Advanced Directives  Does Patient Have a Medical Advance Directive? Yes Yes Yes Yes Yes Yes Yes  Type of Estate agent of [place];Living will Healthcare Power of [place];Living will Healthcare Power of [place];Living will Healthcare Power of [place];Living will Healthcare Power of [place];Living will Healthcare Power of [place];Living will Healthcare Power of [place];Living will  Does patient want to make changes to medical advance directive?  No - Patient declined No - Patient declined No - Patient declined No - Patient declined No - Patient declined   Copy of Healthcare Power of Attorney in Chart? No - copy requested No - copy requested Yes - validated  most recent copy scanned in chart (See row information) No - copy requested Yes - validated most recent copy scanned in chart (See row information) No - copy requested No - copy requested    Current Medications (verified) Outpatient Encounter Medications as of 01/01/2024  Medication Sig   albuterol (VENTOLIN HFA) 108 (90 Base) MCG/ACT inhaler Inhale 2 puffs into the lungs every 6 (six) hours as needed for wheezing or shortness of breath (cough).   Ascorbic Acid (VITAMIN C) 1000 MG tablet Take 1,000 mg by mouth daily.   Cholecalciferol 50 MCG (2000 UT) CAPS Take 1 capsule by mouth daily.   docusate sodium (COLACE) 100 MG capsule Take 200 mg by mouth daily.   FLUoxetine (PROZAC) 20 MG capsule TAKE 1 CAPSULE BY MOUTH EVERY DAY   fluticasone (FLONASE) 50 MCG/ACT nasal spray PLACE TWO SPRAYS INTO BOTH NOSTRILS DAILY AS DIRECTED   fluticasone-salmeterol (ADVAIR) 250-50 MCG/ACT AEPB INHALE 1 PUFF INTO THE LUNGS EVERY 12 HOURS   levocetirizine (XYZAL) 5 MG tablet TAKE ONE TABLET BY MOUTH EVERY EVENING AS DIRECTED   montelukast (SINGULAIR) 10 MG tablet TAKE ONE TABLET BY MOUTH DAILY   pantoprazole (PROTONIX) 40 MG tablet TAKE ONE TABLET BY MOUTH TWICE DAILY   rosuvastatin (CRESTOR) 5 MG tablet TAKE 1 TABLET BY MOUTH DAILY   Spacer/Aero-Holding Chambers (AEROCHAMBER MV) inhaler Use as instructed   meloxicam (MOBIC) 15 MG tablet Take 15 mg by mouth daily. (Patient not taking: Reported on 01/01/2024)   [DISCONTINUED] azithromycin (ZITHROMAX) 500 MG tablet Take 1 tablet (500 mg total) by mouth daily. (  Patient not taking: Reported on 01/01/2024)   [DISCONTINUED] predniSONE (DELTASONE) 10 MG tablet 6 tablets on Day 1 , then reduce by 1 tablet daily until gone (Patient not taking: Reported on 01/01/2024)   No facility-administered encounter medications on file as of 01/01/2024.    Allergies (verified) Tylox [oxycodone-acetaminophen]   History: Past Medical History:  Diagnosis Date   Allergy    Anemia     Anxiety    Arthritis    neck, no limitations   Cancer (HCC)    SKIN   Cough    CHRONIC FROM REFLUX   Depression    GERD (gastroesophageal reflux disease)    SLEEPS WITH HOB ELEVATED   Hyperlipidemia    Past Surgical History:  Procedure Laterality Date   BACK SURGERY  1980   Ruptured Disc   CATARACT EXTRACTION W/PHACO Right 11/28/2015   Procedure: CATARACT EXTRACTION PHACO AND INTRAOCULAR LENS PLACEMENT (IOC);  Surgeon: Sallee Lange, MD;  Location: [facility];  Service: Ophthalmology;  Laterality: Right;  [place] AP% CDE fluid pack lot # [id number]   CATARACT EXTRACTION W/PHACO Left 12/19/2015   Procedure: CATARACT EXTRACTION PHACO AND INTRAOCULAR LENS PLACEMENT (IOC);  Surgeon: Sallee Lange, MD;  Location: [facility];  Service: Ophthalmology;  Laterality: Left;  US:1:08.2 AP%:56.7% CDE:28.10 Lot# [id number]   COLONOSCOPY     COLONOSCOPY WITH PROPOFOL N/A 10/10/2017   Procedure: COLONOSCOPY WITH PROPOFOL;  Surgeon: Midge Minium, MD;  Location: [facility];  Service: Endoscopy;  Laterality: N/A;   COLONOSCOPY WITH PROPOFOL N/A 09/28/2022   Procedure: COLONOSCOPY WITH BIOPSY;  Surgeon: Midge Minium, MD;  Location: [facility];  Service: Endoscopy;  Laterality: N/A;   ESOPHAGOGASTRODUODENOSCOPY (EGD) WITH PROPOFOL N/A 09/28/2022   Procedure: ESOPHAGOGASTRODUODENOSCOPY (EGD) WITH PROPOFOL;  Surgeon: Midge Minium, MD;  Location: [facility];  Service: Endoscopy;  Laterality: N/A;   POLYPECTOMY  10/10/2017   Procedure: POLYPECTOMY;  Surgeon: Midge Minium, MD;  Location: [facility];  Service: Endoscopy;;   POLYPECTOMY N/A 09/28/2022   Procedure: POLYPECTOMY;  Surgeon: Midge Minium, MD;  Location: [facility];  Service: Endoscopy;  Laterality: N/A;   Family History  Problem Relation Age of Onset   Hyperlipidemia Sister    Fibromyalgia Sister    Heart disease Brother    Diabetes Brother    Hypertension Brother    Breast cancer Maternal Aunt 69    Diabetes Paternal Grandmother    Social History   Socioeconomic History   Marital status: Widowed    Spouse name: Not on file   Number of children: Not on file   Years of education: Not on file   Highest education level: Not on file  Occupational History   Not on file  Tobacco Use   Smoking status: Former    Current packs/day: 0.00    Average packs/day: 0.8 packs/day for 15.0 years (11.3 ttl pk-yrs)    Types: Cigarettes    Start date: 08/10/1977    Quit date: 08/10/1992    Years since quitting: 31.4   Smokeless tobacco: Never  Vaping Use   Vaping status: Never Used  Substance and Sexual Activity   Alcohol use: No   Drug use: No   Sexual activity: Not Currently  Other Topics Concern   Not on file  Social History Narrative   Marital status: widowed.      Living with: alone.      Employment: none.      Tobacco: former user.      Alcohol: none  Drugs: none      Exercise: none; she tends to the farm.    Social Drivers of Corporate investment banker Strain: Low Risk  (01/01/2024)   Overall Financial Resource Strain (CARDIA)    Difficulty of Paying Living Expenses: Not hard at all  Food Insecurity: No Food Insecurity (01/01/2024)   Hunger Vital Sign    Worried About Running Out of Food in the Last Year: Never true    Ran Out of Food in the Last Year: Never true  Transportation Needs: No Transportation Needs (01/01/2024)   PRAPARE - Administrator, Civil Service (Medical): No    Lack of Transportation (Non-Medical): No  Physical Activity: Insufficiently Active (01/01/2024)   Exercise Vital Sign    Days of Exercise per Week: 4 days    Minutes of Exercise per Session: 30 min  Stress: No Stress Concern Present (01/01/2024)   [employer] of Occupational Health - Occupational Stress Questionnaire    Feeling of Stress : Not at all  Social Connections: Moderately Integrated (01/01/2024)   Social Connection and Isolation Panel [NHANES]    Frequency of  Communication with Friends and Family: Twice a week    Frequency of Social Gatherings with Friends and Family: More than three times a week    Attends Religious Services: More than 4 times per year    Active Member of [employer] or Organizations: Yes    Attends Banker Meetings: More than 4 times per year    Marital Status: Widowed    Tobacco Counseling Counseling given: Not Answered   Clinical Intake:  Pre-visit preparation completed: Yes  Pain : No/denies pain     BMI - recorded: 22.5 Nutritional Status: BMI of 19-24  Normal Nutritional Risks: None Diabetes: No  How often do you need to have someone help you when you read instructions, pamphlets, or other written materials from your doctor or pharmacy?: 1 - Never  Interpreter Needed?: No  Information entered by :: R. Particia Strahm LPN   Activities of Daily Living    01/01/2024   10:18 AM  In your present state of health, do you have any difficulty performing the following activities:  Hearing? 0  Vision? 0  Difficulty concentrating or making decisions? 0  Walking or climbing stairs? 0  Dressing or bathing? 0  Doing errands, shopping? 0  Preparing Food and eating ? N  Using the Toilet? N  In the past six months, have you accidently leaked urine? N  Do you have problems with loss of bowel control? N  Managing your Medications? N  Managing your Finances? N  Housekeeping or managing your Housekeeping? N    Patient Care Team: Sherlene Shams, MD as PCP - General (Internal Medicine) Salena Saner, MD as Consulting Physician (Pulmonary Disease)  Indicate any recent Medical Services you may have received from other than [facility] providers in the past year (date may be approximate).     Assessment:   This is a routine wellness examination for Nancy Hamilton.  Hearing/Vision screen Hearing Screening - Comments:: No issues Vision Screening - Comments:: readers   Goals Addressed             This Visit's Progress     Patient Stated       Wants to exercise and take care of herself       Depression Screen    01/01/2024   10:28 AM 07/22/2023    9:55 AM 12/31/2022   10:13 AM  07/18/2022    9:20 AM 12/07/2021   10:02 AM 08/30/2021   10:38 AM 07/12/2021    9:13 AM  PHQ 2/9 Scores  PHQ - 2 Score 0 0 0 0 0 0 1  PHQ- 9 Score 0          Fall Risk    01/01/2024   10:20 AM 07/22/2023    9:55 AM 12/31/2022   10:16 AM 07/18/2022    9:20 AM 01/22/2022   10:03 AM  Fall Risk   Falls in the past year? 0 0 0 0 0  Number falls in past yr: 0 0 0    Injury with Fall? 0 0 0    Risk for fall due to : No Fall Risks No Fall Risks  No Fall Risks No Fall Risks  Follow up Falls prevention discussed;Falls evaluation completed Falls evaluation completed Falls evaluation completed;Falls prevention discussed Falls evaluation completed Falls evaluation completed    MEDICARE RISK AT HOME: Medicare Risk at Home Any stairs in or around the home?: Yes If so, are there any without handrails?: No Home free of loose throw rugs in walkways, pet beds, electrical cords, etc?: Yes Adequate lighting in your home to reduce risk of falls?: Yes Life alert?: No Use of a cane, walker or w/c?: No Grab bars in the bathroom?: No Shower chair or bench in shower?: Yes Elevated toilet seat or a handicapped toilet?: Yes   Cognitive Function:    06/26/2018    8:17 AM  MMSE - Mini Mental State Exam  Orientation to time 5  Orientation to Place 5  Registration 3  Attention/ Calculation 5  Recall 3  Language- name 2 objects 2  Language- repeat 1  Language- follow 3 step command 3  Language- read & follow direction 1  Write a sentence 1  Copy design 1  Total score 30        01/01/2024   10:34 AM 12/31/2022   10:21 AM 12/04/2019    9:50 AM  6CIT Screen  What Year? 0 points 0 points 0 points  What month? 0 points 0 points 0 points  What time? 0 points 0 points 0 points  Count back from 20 0 points 0 points   Months in reverse 2  points 0 points   Repeat phrase 0 points 0 points   Total Score 2 points 0 points     Immunizations Immunization History  Administered Date(s) Administered   Fluad Quad(high Dose 65+) 08/25/2019, 09/06/2020, 08/22/2021, 09/11/2022   Fluad Trivalent(High Dose 65+) 08/27/2023   Influenza Split 08/10/2012   Influenza, High Dose Seasonal PF 09/14/2016, 09/03/2017, 08/19/2018   Influenza,inj,Quad PF,6+ Mos 08/20/2013, 08/26/2014, 09/06/2015   Moderna Sars-Covid-2 Vaccination 12/22/2019, 01/19/2020, 09/26/2020   Pneumococcal Conjugate-13 02/25/2014   Pneumococcal Polysaccharide-23 02/18/2013, 06/22/2020   Tdap 12/01/2010, 07/18/2021   Zoster Recombinant(Shingrix) 10/23/2019, 02/10/2020   Zoster, Live 10/20/2015    TDAP status: Up to date  Flu Vaccine status: Up to date  Pneumococcal vaccine status: Up to date  Covid-19 vaccine status: Declined, Education has been provided regarding the importance of this vaccine but patient still declined. Advised may receive this vaccine at local pharmacy or Health Dept.or vaccine clinic. Aware to provide a copy of the vaccination record if obtained from local pharmacy or Health Dept. Verbalized acceptance and understanding.  Qualifies for Shingles Vaccine? Yes   Zostavax completed Yes   Shingrix Completed?: Yes  Screening Tests Health Maintenance  Topic Date Due   COVID-19 Vaccine (  4 - 2024-25 season) 07/14/2023   Medicare Annual Wellness (AWV)  01/01/2024   MAMMOGRAM  06/09/2024   Colonoscopy  09/29/2027   DTaP/Tdap/Td (3 - Td or Tdap) 07/19/2031   Pneumonia Vaccine 76+ Years old  Completed   INFLUENZA VACCINE  Completed   DEXA SCAN  Completed   Hepatitis C Screening  Completed   Zoster Vaccines- Shingrix  Completed   HPV VACCINES  Aged Out    Health Maintenance  Health Maintenance Due  Topic Date Due   COVID-19 Vaccine (4 - 2024-25 season) 07/14/2023   Medicare Annual Wellness (AWV)  01/01/2024    Colorectal cancer screening: No  longer required.   Mammogram status: Completed 05/2023. Repeat every year  Bone Density status: Completed 08/2022. Results reflect: Bone density results: OSTEOPENIA. Repeat every 2 years.  Lung Cancer Screening: (Low Dose CT Chest recommended if Age 40-80 years, 20 pack-year currently smoking OR have quit w/in 15years.) does not qualify.     Additional Screening:  Hepatitis C Screening:  Does qualify; Completed 02/2015  Vision Screening: Recommended annual ophthalmology exams for early detection of glaucoma and other disorders of the eye. Is the patient up to date with their annual eye exam?  Yes  Who is the provider or what is the name of the office in which the patient attends annual eye exams? [facility] If pt is not established with a provider, would they like to be referred to a provider to establish care? No .   Dental Screening: Recommended annual dental exams for proper oral hygiene    Community Resource Referral / Chronic Care Management: CRR required this visit?  No   CCM required this visit?  No     Plan:     I have personally reviewed and noted the following in the patient's chart:   Medical and social history Use of alcohol, tobacco or illicit drugs  Current medications and supplements including opioid prescriptions. Patient is not currently taking opioid prescriptions. Functional ability and status Nutritional status Physical activity Advanced directives List of other physicians Hospitalizations, surgeries, and ER visits in previous 12 months Vitals Screenings to include cognitive, depression, and falls Referrals and appointments  In addition, I have reviewed and discussed with patient certain preventive protocols, quality metrics, and best practice recommendations. A written personalized care plan for preventive services as well as general preventive health recommendations were provided to patient.     Sydell Axon, LPN   [id number]/[id number]   After Visit  Summary: (Pick Up) Due to this being a telephonic visit, with patients personalized plan was offered to patient and patient has requested to Pick up at office.  Nurse Notes: None

## 2024-01-01 NOTE — Patient Instructions (Signed)
 Ms. Scibilia , Thank you for taking time to come for your Medicare Wellness Visit. I appreciate your ongoing commitment to your health goals. Please review the following plan we discussed and let me know if I can assist you in the future.   Referrals/Orders/Follow-Ups/Clinician Recommendations: None   This is a list of the screening recommended for you and due dates:  Health Maintenance  Topic Date Due   COVID-19 Vaccine (4 - 2024-25 season) 07/14/2023   Mammogram  06/09/2024   Medicare Annual Wellness Visit  12/31/2024   Colon Cancer Screening  09/29/2027   DTaP/Tdap/Td vaccine (3 - Td or Tdap) 07/19/2031   Pneumonia Vaccine  Completed   Flu Shot  Completed   DEXA scan (bone density measurement)  Completed   Hepatitis C Screening  Completed   Zoster (Shingles) Vaccine  Completed   HPV Vaccine  Aged Out    Advanced directives: (Copy Requested) Please bring a copy of your health care power of attorney and living will to the office to be added to your chart at your convenience.  Next Medicare Annual Wellness Visit scheduled for next year: Yes 01/06/25 @ 10:10

## 2024-01-03 DIAGNOSIS — C44729 Squamous cell carcinoma of skin of left lower limb, including hip: Secondary | ICD-10-CM | POA: Diagnosis not present

## 2024-01-03 DIAGNOSIS — C44722 Squamous cell carcinoma of skin of right lower limb, including hip: Secondary | ICD-10-CM | POA: Diagnosis not present

## 2024-01-06 ENCOUNTER — Other Ambulatory Visit: Payer: Self-pay | Admitting: Pulmonary Disease

## 2024-01-06 NOTE — Telephone Encounter (Signed)
 Okay to fill?

## 2024-01-07 ENCOUNTER — Ambulatory Visit (INDEPENDENT_AMBULATORY_CARE_PROVIDER_SITE_OTHER): Payer: HMO

## 2024-01-07 DIAGNOSIS — E538 Deficiency of other specified B group vitamins: Secondary | ICD-10-CM | POA: Diagnosis not present

## 2024-01-07 MED ORDER — CYANOCOBALAMIN 1000 MCG/ML IJ SOLN
1000.0000 ug | Freq: Once | INTRAMUSCULAR | Status: AC
Start: 2024-01-07 — End: 2024-01-07
  Administered 2024-01-07: 1000 ug via INTRAMUSCULAR

## 2024-01-07 NOTE — Progress Notes (Signed)
 Patient presented for B 12 injection to left deltoid, patient voiced no concerns nor showed any signs of distress during injection.

## 2024-01-20 ENCOUNTER — Ambulatory Visit (INDEPENDENT_AMBULATORY_CARE_PROVIDER_SITE_OTHER): Payer: HMO | Admitting: Internal Medicine

## 2024-01-20 ENCOUNTER — Encounter: Payer: Self-pay | Admitting: Internal Medicine

## 2024-01-20 VITALS — BP 126/70 | HR 59 | Ht 63.0 in | Wt 133.2 lb

## 2024-01-20 DIAGNOSIS — E559 Vitamin D deficiency, unspecified: Secondary | ICD-10-CM | POA: Diagnosis not present

## 2024-01-20 DIAGNOSIS — Z789 Other specified health status: Secondary | ICD-10-CM

## 2024-01-20 DIAGNOSIS — R7303 Prediabetes: Secondary | ICD-10-CM | POA: Diagnosis not present

## 2024-01-20 DIAGNOSIS — M8580 Other specified disorders of bone density and structure, unspecified site: Secondary | ICD-10-CM

## 2024-01-20 DIAGNOSIS — E538 Deficiency of other specified B group vitamins: Secondary | ICD-10-CM | POA: Diagnosis not present

## 2024-01-20 DIAGNOSIS — Z1231 Encounter for screening mammogram for malignant neoplasm of breast: Secondary | ICD-10-CM

## 2024-01-20 DIAGNOSIS — Z780 Asymptomatic menopausal state: Secondary | ICD-10-CM

## 2024-01-20 DIAGNOSIS — R7301 Impaired fasting glucose: Secondary | ICD-10-CM

## 2024-01-20 DIAGNOSIS — K219 Gastro-esophageal reflux disease without esophagitis: Secondary | ICD-10-CM

## 2024-01-20 DIAGNOSIS — I700 Atherosclerosis of aorta: Secondary | ICD-10-CM | POA: Diagnosis not present

## 2024-01-20 DIAGNOSIS — F411 Generalized anxiety disorder: Secondary | ICD-10-CM

## 2024-01-20 DIAGNOSIS — E785 Hyperlipidemia, unspecified: Secondary | ICD-10-CM

## 2024-01-20 DIAGNOSIS — J4530 Mild persistent asthma, uncomplicated: Secondary | ICD-10-CM

## 2024-01-20 DIAGNOSIS — D369 Benign neoplasm, unspecified site: Secondary | ICD-10-CM | POA: Diagnosis not present

## 2024-01-20 LAB — COMPREHENSIVE METABOLIC PANEL
ALT: 10 U/L (ref 0–35)
AST: 12 U/L (ref 0–37)
Albumin: 4.1 g/dL (ref 3.5–5.2)
Alkaline Phosphatase: 70 U/L (ref 39–117)
BUN: 10 mg/dL (ref 6–23)
CO2: 29 meq/L (ref 19–32)
Calcium: 9.2 mg/dL (ref 8.4–10.5)
Chloride: 106 meq/L (ref 96–112)
Creatinine, Ser: 0.86 mg/dL (ref 0.40–1.20)
GFR: 65.48 mL/min (ref >60.00)
Glucose, Bld: 100 mg/dL — ABNORMAL HIGH (ref 70–99)
Potassium: 4.2 meq/L (ref 3.5–5.1)
Sodium: 144 meq/L (ref 135–145)
Total Bilirubin: 0.4 mg/dL (ref 0.2–1.2)
Total Protein: 6.3 g/dL (ref 6.0–8.3)

## 2024-01-20 LAB — LIPID PANEL
Cholesterol: 160 mg/dL (ref 0–200)
HDL: 72.9 mg/dL (ref >39.00)
LDL Cholesterol: 65 mg/dL (ref 0–99)
NonHDL: 86.9
Total CHOL/HDL Ratio: 2
Triglycerides: 112 mg/dL (ref 0.0–149.0)
VLDL: 22.4 mg/dL (ref 0.0–40.0)

## 2024-01-20 LAB — VITAMIN D 25 HYDROXY (VIT D DEFICIENCY, FRACTURES): VITD: 54.79 ng/mL (ref 30.00–100.00)

## 2024-01-20 LAB — LDL CHOLESTEROL, DIRECT: Direct LDL: 68 mg/dL

## 2024-01-20 LAB — HEMOGLOBIN A1C: Hgb A1c MFr Bld: 5.8 % (ref 4.6–6.5)

## 2024-01-20 MED ORDER — PANTOPRAZOLE SODIUM 40 MG PO TBEC: DELAYED_RELEASE_TABLET | ORAL | 1 refills | Status: DC

## 2024-01-20 NOTE — Assessment & Plan Note (Signed)
She is tolerating rosuvastatin

## 2024-01-20 NOTE — Assessment & Plan Note (Signed)
 Managed with monthly parenteral supplementation since diagnosis in 2015.  IF antibody  POSITIVE  2022  Lab Results  Component Value Date   VITAMINB12 474 04/24/2022

## 2024-01-20 NOTE — Assessment & Plan Note (Addendum)
 Her anxiety continues to be well controlled WITHOUT prozac..  no changes today. 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   counselling given

## 2024-01-20 NOTE — Assessment & Plan Note (Addendum)
 Found on 2018 colonoscopy by DW  but NOT ON Follow up in 2023.  NO future colonoscopies planned due to age.

## 2024-01-20 NOTE — Assessment & Plan Note (Signed)
 Noted on chest x ray. And reviewed with patient today.  She is tolerating rosuvastatin as preventive ; LDL now < 100  Lab Results  Component Value Date   CHOL 169 07/22/2023   HDL 83.30 07/22/2023   LDLCALC 71 07/22/2023   LDLDIRECT 97.0 07/22/2023   TRIG 71.0 07/22/2023   CHOLHDL 2 07/22/2023

## 2024-01-20 NOTE — Assessment & Plan Note (Signed)
 She notes an improvement in symptoms with less fruit intake and  is takingher PPI twicee daily.  Reviewed principles of GERD management, which she is not following.  Advised to use a wedge pillow at night , avoiding foods and behaviors that trigger her reflux symptoms, and avoid overeating and eating within 2 hours of reclining .  She denies any recent episodes of chest pain since stopping the peach eating

## 2024-01-20 NOTE — Assessment & Plan Note (Signed)
 LDL and triglycerides are at goal on current medications. She  has no side effects and liver enzymes are normal. No changes today  Lab Results  Component Value Date   CHOL 160 01/20/2024   HDL 72.90 01/20/2024   LDLCALC 65 01/20/2024   LDLDIRECT 68.0 01/20/2024   TRIG 112.0 01/20/2024   CHOLHDL 2 01/20/2024   Lab Results  Component Value Date   ALT 10 01/20/2024   AST 12 01/20/2024   ALKPHOS 70 01/20/2024   BILITOT 0.4 01/20/2024

## 2024-01-20 NOTE — Assessment & Plan Note (Signed)
 Her cough resolved with treatment of GERD by DR Jayme Cloud

## 2024-01-20 NOTE — Patient Instructions (Addendum)
 Places to get your elderly couple to look at: [facility]  [place]    We will repeat your bone density scan this fall.  Start walking,. Continue calcium and vitamin D   Since you  are over 60 or have a history of asthma   the RSV vaccine is recommended. The office can provide the vaccine to anyone under the age of 60 who is not on Medicare.  [facility], [facility] and [facility], 's currently have the vaccine in stock

## 2024-01-20 NOTE — Progress Notes (Signed)
 Subjective:  Patient ID: Nancy Hamilton, female    DOB: [birth year]  Age: 78 y.o. MRN: [id number]  CC: The primary encounter diagnosis was Hyperlipidemia LDL goal <100. Diagnoses of Impaired fasting glucose, Encounter for screening mammogram for malignant neoplasm of breast, B12 deficiency, Osteopenia after menopause, Thoracic aortic atherosclerosis (HCC), Generalized anxiety disorder, Prediabetes, Vitamin D deficiency, Statin intolerance, Asthma in adult, mild persistent, uncomplicated, Adenomatous polyps, and Gastroesophageal reflux disease without esophagitis were also pertinent to this visit.   HPI Nancy Hamilton presents for  Chief Complaint  Patient presents with   Medical Management of Chronic Issues    6 month follow up    1) Thoracic aortic atherosclerosis:  we reviewed findings of prior CT scan today..  Patient is tolerating   hi potency statin  therapy without side effects  2) osteopenia:  reviewed last DEXA Oct 2023.  T score -2.3 .  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 She  feels generally well,  But is not  exercising regularly or trying to lose weight. Following a carbohydrate modified diet 6 days per week. Denies numbness, burning and tingling of extremities. Appetite is good.    Lab Results  Component Value Date   HGBA1C 5.8 01/20/2024      Outpatient Medications Prior to Visit  Medication Sig Dispense Refill   albuterol (VENTOLIN HFA) 108 (90 Base) MCG/ACT inhaler Inhale 2 puffs into the lungs every 6 (six) hours as needed for wheezing or shortness of  breath (cough). 18 g 2   Ascorbic Acid (VITAMIN C) 1000 MG tablet Take 1,000 mg by mouth daily.     Cholecalciferol 50 MCG (2000 UT) CAPS Take 1 capsule by mouth daily.     fluticasone (FLONASE) 50 MCG/ACT nasal spray PLACE TWO SPRAYS INTO BOTH NOSTRILS DAILY AS DIRECTED 48 g 1   fluticasone-salmeterol (ADVAIR) 250-50 MCG/ACT AEPB INHALE 1 PUFF INTO THE LUNGS EVERY 12 HOURS 180 each 3   levocetirizine (XYZAL) 5 MG tablet TAKE ONE TABLET BY MOUTH EVERY EVENING AS DIRECTED 90 tablet 1   montelukast (SINGULAIR) 10 MG tablet TAKE ONE TABLET BY MOUTH DAILY 90 tablet 3   rosuvastatin (CRESTOR) 5 MG tablet TAKE 1 TABLET BY MOUTH DAILY 90 tablet 3   Spacer/Aero-Holding Chambers (AEROCHAMBER MV) inhaler Use as instructed 1 each 0   pantoprazole (PROTONIX) 40 MG tablet TAKE ONE TABLET BY MOUTH TWICE DAILY 180 tablet 1   docusate sodium (COLACE) 100 MG capsule Take 200 mg by mouth daily. (Patient not taking: Reported on 01/20/2024)     FLUoxetine (PROZAC) 20 MG capsule TAKE 1 CAPSULE BY MOUTH EVERY DAY (Patient not taking: Reported on 01/20/2024) 90 capsule 1   meloxicam (MOBIC) 15 MG tablet Take 15 mg by mouth daily. (Patient not taking: Reported on 01/20/2024)     No facility-administered medications prior to visit.    Review of Systems;  Patient denies headache, fevers, malaise, unintentional weight loss, skin rash, eye pain, sinus congestion and sinus pain, sore throat, dysphagia,  hemoptysis , cough, dyspnea, wheezing, chest pain, palpitations, orthopnea, edema, abdominal pain, nausea, melena, diarrhea, constipation, flank pain, dysuria, hematuria, urinary  Frequency, nocturia, numbness, tingling, seizures,  Focal weakness, Loss of consciousness,  Tremor, insomnia, depression, anxiety, and suicidal ideation.      Objective:  BP 126/70   Pulse (!) 59   Ht 5\' 3"  (1.6 m)   Wt 133 lb 3.2 oz (60.4 kg)   SpO2 99%   BMI 23.60 kg/m   BP Readings from Last 3 Encounters:  01/20/24 126/70  09/16/23  116/68  07/22/23 128/68    Wt Readings from Last 3 Encounters:  01/20/24 133 lb 3.2 oz (60.4 kg)  01/01/24 127 lb (57.6 kg)  09/16/23 128 lb (58.1 kg)    Physical Exam Vitals reviewed.  Constitutional:      General: She is not in acute distress.    Appearance: Normal appearance. She is normal weight. She is not ill-appearing, toxic-appearing or diaphoretic.  HENT:     Head: Normocephalic.  Eyes:     General: No scleral icterus.       Right eye: No discharge.        Left eye: No discharge.     Conjunctiva/sclera: Conjunctivae normal.  Cardiovascular:     Rate and Rhythm: Normal rate and regular rhythm.     Heart sounds: Normal heart sounds.  Pulmonary:     Effort: Pulmonary effort is normal. No respiratory distress.     Breath sounds: Normal breath sounds.  Musculoskeletal:        General: Normal range of motion.  Skin:    General: Skin is warm and dry.  Neurological:     General: No focal deficit present.     Mental Status: She is alert and oriented to person, place, and time. Mental status is at baseline.  Psychiatric:        Mood and Affect: Mood normal.        Behavior: Behavior normal.        Thought Content: Thought content normal.        Judgment: Judgment normal.    Lab Results  Component Value Date   HGBA1C 5.8 01/20/2024   HGBA1C 6.2 07/22/2023   HGBA1C 6.1 07/18/2022    Lab Results  Component Value Date   CREATININE 0.86 01/20/2024   CREATININE 0.94 07/22/2023   CREATININE 0.93 07/18/2022    Lab Results  Component Value Date   WBC 4.2 07/22/2023   HGB 13.3 07/22/2023   HCT 40.3 07/22/2023   PLT 195.0 07/22/2023   GLUCOSE 100 (H) 01/20/2024   CHOL 160 01/20/2024   TRIG 112.0 01/20/2024   HDL 72.90 01/20/2024   LDLDIRECT 68.0 01/20/2024   LDLCALC 65 01/20/2024   ALT 10 01/20/2024   AST 12 01/20/2024   NA 144 01/20/2024   K 4.2 01/20/2024   CL 106 01/20/2024   CREATININE 0.86 01/20/2024   BUN 10 01/20/2024   CO2 29 01/20/2024   TSH  1.75 07/22/2023   HGBA1C 5.8 01/20/2024    Mammogram 3D SCREEN BREAST BILATERAL Result Date: 06/11/2023 CLINICAL DATA:  Screening. EXAM: DIGITAL SCREENING BILATERAL MAMMOGRAM WITH TOMOSYNTHESIS AND CAD TECHNIQUE: Bilateral screening digital craniocaudal and mediolateral oblique mammograms were obtained. Bilateral screening digital breast tomosynthesis was performed. The images were evaluated with computer-aided detection. COMPARISON:  Previous exam(s). ACR Breast Density Category d: The breasts are extremely dense, which lowers the sensitivity of mammography. FINDINGS: There are no findings suspicious for malignancy. IMPRESSION: No mammographic evidence  of malignancy. A result letter of this screening mammogram will be mailed directly to the patient. RECOMMENDATION: Screening mammogram in one year. (Code:[id number]-01Y) BI-RADS CATEGORY  1: Negative. Electronically Signed   By: Sherian Rein M.D.   On: 06/11/2023 11:02    Assessment & Plan:  .Hyperlipidemia LDL goal <100 Assessment & Plan: LDL and triglycerides are at goal on current medications. She  has no side effects and liver enzymes are normal. No changes today  Lab Results  Component Value Date   CHOL 160 01/20/2024   HDL 72.90 01/20/2024   LDLCALC 65 01/20/2024   LDLDIRECT 68.0 01/20/2024   TRIG 112.0 01/20/2024   CHOLHDL 2 01/20/2024   Lab Results  Component Value Date   ALT 10 01/20/2024   AST 12 01/20/2024   ALKPHOS 70 01/20/2024   BILITOT 0.4 01/20/2024       Orders: -     Lipid panel -     LDL cholesterol, direct  Impaired fasting glucose  Encounter for screening mammogram for malignant neoplasm of breast -     3D Screening Mammogram, Left and Right; Future  B12 deficiency Assessment & Plan: Managed with monthly parenteral supplementation since diagnosis in 2015.  IF antibody  POSITIVE  2022  Lab Results  Component Value Date   VITAMINB12 474 04/24/2022       Osteopenia after menopause  Thoracic aortic  atherosclerosis (HCC) Assessment & Plan: Noted on chest x ray. And reviewed with patient today.  She is tolerating rosuvastatin as preventive ; LDL now < 100  Lab Results  Component Value Date   CHOL 169 07/22/2023   HDL 83.30 07/22/2023   LDLCALC 71 07/22/2023   LDLDIRECT 97.0 07/22/2023   TRIG 71.0 07/22/2023   CHOLHDL 2 07/22/2023      Generalized anxiety disorder Assessment & Plan: Her anxiety continues to be well controlled WITHOUT prozac..  no changes today. 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   counselling given       Prediabetes -     Comprehensive metabolic panel -     Hemoglobin A1c  Vitamin D deficiency -     VITAMIN D 25 Hydroxy (Vit-D Deficiency, Fractures)  Statin intolerance Assessment & Plan: She is tolerating rosuvastatin    Asthma in adult, mild persistent, uncomplicated Assessment & Plan: Her cough resolved with treatment of GERD by DR Jayme Cloud    Adenomatous polyps Assessment & Plan: Found on 2018 colonoscopy by DW  but NOT ON Follow up in 2023.  NO future colonoscopies planned due to age.   Gastroesophageal reflux disease without esophagitis Assessment & Plan: She notes an improvement in symptoms with less fruit intake and  is takingher PPI twicee daily.  Reviewed principles of GERD management, which she is not following.  Advised to use a wedge pillow at night , avoiding foods and behaviors that trigger her reflux symptoms, and avoid overeating and eating within 2 hours of reclining .  She denies any recent episodes of chest pain since stopping the peach eating      Other orders -     Pantoprazole Sodium; TAKE ONE TABLET BY MOUTH TWICE DAILY  Dispense: 180 tablet; Refill: 1     I spent 34 minutes on the day of this face to face encounter reviewing patient's  most recent visit with  pulmonology,  prior relevant surgical and non surgical procedures,  recent  labs  and imaging studies, counseling on caregiver fatigue ,  reviewing the assessment and plan with patient, and post visit ordering and reviewing of  diagnostics and therapeutics with patient  .   Follow-up: Return in about 6 months (around 07/22/2024) for physical.   Sherlene Shams, MD

## 2024-02-11 ENCOUNTER — Ambulatory Visit (INDEPENDENT_AMBULATORY_CARE_PROVIDER_SITE_OTHER): Payer: HMO

## 2024-02-11 DIAGNOSIS — E538 Deficiency of other specified B group vitamins: Secondary | ICD-10-CM

## 2024-02-11 MED ORDER — CYANOCOBALAMIN 1000 MCG/ML IJ SOLN
1000.0000 ug | Freq: Once | INTRAMUSCULAR | Status: AC
  Administered 2024-02-11: 1000 ug via INTRAMUSCULAR

## 2024-02-11 NOTE — Progress Notes (Signed)
 Patient presented for B 12 injection to right deltoid, patient voiced no concerns nor showed any signs of distress during injection.

## 2024-03-02 ENCOUNTER — Other Ambulatory Visit: Payer: Self-pay | Admitting: Internal Medicine

## 2024-03-17 ENCOUNTER — Ambulatory Visit

## 2024-03-17 DIAGNOSIS — E538 Deficiency of other specified B group vitamins: Secondary | ICD-10-CM

## 2024-03-17 MED ORDER — CYANOCOBALAMIN 1000 MCG/ML IJ SOLN
1000.0000 ug | Freq: Once | INTRAMUSCULAR | Status: AC
  Administered 2024-03-17: 1000 ug via INTRAMUSCULAR

## 2024-03-17 NOTE — Progress Notes (Signed)
 Patient presented for B 12 injection to left deltoid, patient voiced no concerns nor showed any signs of distress during injection.

## 2024-03-20 ENCOUNTER — Ambulatory Visit: Payer: HMO | Admitting: Pulmonary Disease

## 2024-03-20 ENCOUNTER — Encounter: Payer: Self-pay | Admitting: Pulmonary Disease

## 2024-03-20 VITALS — BP 120/70 | HR 64 | Temp 97.6°F | Ht 63.0 in | Wt 130.6 lb

## 2024-03-20 DIAGNOSIS — K219 Gastro-esophageal reflux disease without esophagitis: Secondary | ICD-10-CM

## 2024-03-20 DIAGNOSIS — J45991 Cough variant asthma: Secondary | ICD-10-CM | POA: Diagnosis not present

## 2024-03-20 DIAGNOSIS — Z87891 Personal history of nicotine dependence: Secondary | ICD-10-CM | POA: Diagnosis not present

## 2024-03-20 DIAGNOSIS — J329 Chronic sinusitis, unspecified: Secondary | ICD-10-CM

## 2024-03-20 NOTE — Patient Instructions (Addendum)
 VISIT SUMMARY:  Today, we reviewed your asthma and laryngopharyngeal reflux (LPR) conditions. Your cough has improved significantly, and you have not needed to use your rescue inhaler recently. We discussed your current medications and made some adjustments to your treatment plan to help manage your symptoms more effectively.  YOUR PLAN:  -COUGH VARIANT ASTHMA: Variant asthma is a type of asthma where coughing is the main symptom. Your asthma is well-controlled with your current treatment, and you have not had any recent sinus infections or significant coughing episodes. Please continue using your inhaler twice daily.  -LARYNGOPHARYNGEAL REFLUX (LPR): LPR is a condition where stomach acid flows back into the throat, causing irritation. Your endoscopy in November 2023 showed significant esophagitis, which is inflammation of the esophagus. To minimize long-term side effects, reduce your acid reflux medication to once daily. Continue to monitor your cough and reflux symptoms, and discuss your reflux management with Dr. Madelon Scheuermann in September. Avoid eating after 7 PM and limit your intake of soft drinks and chocolate.  INSTRUCTIONS:  Please follow up with Dr. Madelon Scheuermann in September to discuss your reflux management. Continue monitoring your symptoms and make sure to follow the dietary recommendations provided.  Will see you in follow-up in 6 months time call sooner should any new problems arise.

## 2024-03-20 NOTE — Progress Notes (Signed)
 Subjective:    Patient ID: Nancy Hamilton, female    DOB: 08-Feb-1946, 78 y.o.   MRN: [id number]  Patient Care Team: Thersia Flax, MD as PCP - General (Internal Medicine) Marc Senior, MD as Consulting Physician (Pulmonary Disease)  Chief Complaint  Patient presents with   Follow-up    No breathing problems.    BACKGROUND/INTERVAL: 78 year old remote former smoker (quit 1993) who presents for follow-up on the issue of cough variant asthma with a component of LPR trigger.  Patient was last seen on 16 September 2023.   HPI Discussed the use of AI scribe software for clinical note transcription with the patient, who gave verbal consent to proceed.  History of Present Illness   Nancy Hamilton is a 78 year old female with variant asthma and laryngopharyngeal reflux who presents with a cough.  Her cough has improved significantly, with a noticeable reduction in frequency and severity. It is described as dry, and she has not needed to use her rescue inhaler recently.  She is currently taking Advair twice daily for asthma management and attributes her lack of sinus infections over the winter to her improved condition, despite high pollen levels.  For her laryngopharyngeal reflux, she has been taking acid reflux medication twice daily since her last visit. She is concerned about the long-term use of this medication, as it is generally advised not to be taken for more than six months. She is mindful of dietary triggers such as soft drinks and chocolate and avoids eating after 7 PM to manage her symptoms.  In November 2023, an endoscopy revealed significant esophagitis, prompting an increase in her acid reflux medication. She plans to discuss her medication regimen with her primary physician in September.  No recent use of her rescue inhaler and no sinus infections over the winter.     Review of Systems A 10 point review of systems was performed and it is as noted above otherwise  negative.   Patient Active Problem List   Diagnosis Date Noted   Osteopenia after menopause 07/22/2023   Arthritis of right knee 06/28/2022   Swelling of knee joint 06/28/2022   Thoracic aortic atherosclerosis (HCC) 12/21/2018   Stress incontinence 03/22/2018   History of colonic polyps    Benign paroxysmal positional vertigo of right ear 11/14/2016   Cervical disc disorder with radiculopathy of cervical region 03/09/2016   Statin intolerance 06/08/2014   B12 deficiency 06/07/2014   Adenomatous polyps 02/28/2014   Encounter for preventive health examination 02/20/2013   Generalized anxiety disorder 12/01/2012   GERD (gastroesophageal reflux disease) 12/01/2012   Asthma in adult, mild persistent, uncomplicated 12/01/2012   Hyperlipidemia LDL goal <100 12/01/2012    Social History   Tobacco Use   Smoking status: Former    Current packs/day: 0.00    Average packs/day: 0.8 packs/day for 15.0 years (11.3 ttl pk-yrs)    Types: Cigarettes    Start date: 08/10/1977    Quit date: 08/10/1992    Years since quitting: 31.6   Smokeless tobacco: Never  Substance Use Topics   Alcohol use: No    Allergies  Allergen Reactions   Tylox [Oxycodone-Acetaminophen ] Rash    Current Meds  Medication Sig   albuterol  (VENTOLIN  HFA) 108 (90 Base) MCG/ACT inhaler Inhale 2 puffs into the lungs every 6 (six) hours as needed for wheezing or shortness of breath (cough).   Ascorbic Acid (VITAMIN C) 1000 MG tablet Take 1,000 mg by mouth daily.   Cholecalciferol  50 MCG (2000 UT) CAPS Take 1 capsule by mouth daily.   fluticasone  (FLONASE ) 50 MCG/ACT nasal spray PLACE TWO SPRAYS INTO BOTH NOSTRILS DAILY AS DIRECTED   fluticasone -salmeterol (ADVAIR) 250-50 MCG/ACT AEPB INHALE 1 PUFF INTO THE LUNGS EVERY 12 HOURS   levocetirizine (XYZAL ) 5 MG tablet TAKE ONE TABLET BY MOUTH EVERY EVENING AS DIRECTED   montelukast  (SINGULAIR ) 10 MG tablet TAKE ONE TABLET BY MOUTH DAILY   pantoprazole  (PROTONIX ) 40 MG tablet  TAKE ONE TABLET BY MOUTH TWICE DAILY   rosuvastatin  (CRESTOR ) 5 MG tablet TAKE 1 TABLET BY MOUTH DAILY   Spacer/Aero-Holding Chambers (AEROCHAMBER MV) inhaler Use as instructed    Immunization History  Administered Date(s) Administered   Fluad Quad(high Dose 65+) 08/25/2019, 09/06/2020, 08/22/2021, 09/11/2022   Fluad Trivalent(High Dose 65+) 08/27/2023   Influenza Split 08/10/2012   Influenza, High Dose Seasonal PF 09/14/2016, 09/03/2017, 08/19/2018   Influenza,inj,Quad PF,6+ Mos 08/20/2013, 08/26/2014, 09/06/2015   Moderna Sars-Covid-2 Vaccination 12/22/2019, 01/19/2020, 09/26/2020   Pneumococcal Conjugate-13 02/25/2014   Pneumococcal Polysaccharide-23 02/18/2013, 06/22/2020   Respiratory Syncytial Virus Vaccine,Recomb Aduvanted(Arexvy) 01/31/2024   Tdap 12/01/2010, 07/18/2021   Zoster Recombinant(Shingrix) 10/23/2019, 02/10/2020   Zoster, Live 10/20/2015        Objective:     BP 120/70 (BP Location: Left Arm, Patient Position: Sitting, Cuff Size: Normal)   Pulse 64   Temp 97.6 F (36.4 C) (Temporal)   Ht 5\' 3"  (1.6 m)   Wt 130 lb 9.6 oz (59.2 kg)   SpO2 98%   BMI 23.13 kg/m   SpO2: 98 %  GENERAL: Well-developed well-nourished, fit appearing, fully ambulatory, no acute distress. HEAD: Normocephalic, atraumatic.  EYES: Pupils equal, round, reactive to light.  No scleral icterus.  MOUTH: Dentition intact, oral mucosa moist.  No thrush. NECK: Supple. No thyromegaly. Trachea midline. No JVD.  No adenopathy. PULMONARY: Lungs clear to auscultation bilaterally.  Good air entry bilaterally. CARDIOVASCULAR: S1 and S2. Regular rate and rhythm.  No rubs, murmurs or gallops heard. GASTROINTESTINAL: No abdominal distention noted. MUSCULOSKELETAL: No joint deformity, no clubbing, no edema.  NEUROLOGIC: Awake, alert, fully oriented.  No focal deficit noted.  No gait disturbance noted. SKIN: Intact,warm,dry.  On limited exam no rashes. PSYCH: Mood and behavior  appropriate.   Assessment & Plan:     ICD-10-CM   1. Cough variant asthma  J45.991     2. Laryngopharyngeal reflux (LPR)  K21.9     3. Chronic rhinosinusitis  J32.9      Discussion:    Cough variant asthma Cough variant asthma is well-controlled with current treatment. No recent sinus infections or significant coughing episodes. No use of rescue inhaler reported. - Continue Advair inhaler twice daily.  Laryngopharyngeal reflux (LPR) LPR with esophagitis noted on endoscopy in November 2023. She has been on acid reflux medication twice daily but will reduce to once daily to minimize long-term side effects. She is aware of the potential for esophagitis to develop into a precancerous condition if not managed properly. Educated on dietary modifications to prevent reflux symptoms. - Reduce acid reflux medication to once daily. - Monitor cough and reflux symptoms. - Discuss reflux management with Dr. Madelon Scheuermann in September. - Avoid eating after 7 PM. - Limit intake of soft drinks and chocolate.     Advised if symptoms do not improve or worsen, to please contact office for sooner follow up or seek emergency care.    I spent 30 minutes of dedicated to the care of this patient on the date  of this encounter to include pre-visit review of records, face-to-face time with the patient discussing conditions above, post visit ordering of testing, clinical documentation with the electronic health record, making appropriate referrals as documented, and communicating necessary findings to members of the patients care team.     C. Chloe Counter, MD Advanced Bronchoscopy P[facility]    *This note was generated using voice recognition software/Dragon and/or AI transcription program.  Despite best efforts to proofread, errors can occur which can change the meaning. Any transcriptional errors that result from this process are unintentional and may not be fully corrected at the time  of dictation.

## 2024-04-10 ENCOUNTER — Encounter: Payer: Self-pay | Admitting: Pulmonary Disease

## 2024-04-21 ENCOUNTER — Ambulatory Visit

## 2024-04-21 DIAGNOSIS — E538 Deficiency of other specified B group vitamins: Secondary | ICD-10-CM

## 2024-04-21 MED ORDER — CYANOCOBALAMIN 1000 MCG/ML IJ SOLN
1000.0000 ug | Freq: Once | INTRAMUSCULAR | Status: AC
  Administered 2024-04-21: 1000 ug via INTRAMUSCULAR

## 2024-04-21 NOTE — Progress Notes (Signed)
 Patient presented for B 12 injection to left deltoid, patient voiced no concerns nor showed any signs of distress during injection.

## 2024-04-23 ENCOUNTER — Other Ambulatory Visit: Payer: Self-pay | Admitting: Internal Medicine

## 2024-05-26 ENCOUNTER — Ambulatory Visit

## 2024-05-26 DIAGNOSIS — E538 Deficiency of other specified B group vitamins: Secondary | ICD-10-CM | POA: Diagnosis not present

## 2024-05-26 MED ORDER — CYANOCOBALAMIN 1000 MCG/ML IJ SOLN
1000.0000 ug | Freq: Once | INTRAMUSCULAR | Status: AC
  Administered 2024-05-26: 1000 ug via INTRAMUSCULAR

## 2024-05-26 NOTE — Progress Notes (Signed)
Pt received B12 injection in right deltoid. Pt tolerated it well with no complaints or concerns. 

## 2024-06-10 ENCOUNTER — Ambulatory Visit
Admission: RE | Admit: 2024-06-10 | Discharge: 2024-06-10 | Disposition: A | Discharge status: Home / Self Care | Source: Ambulatory Visit | Attending: Internal Medicine | Admitting: Internal Medicine

## 2024-06-10 DIAGNOSIS — Z1231 Encounter for screening mammogram for malignant neoplasm of breast: Secondary | ICD-10-CM | POA: Insufficient documentation

## 2024-06-18 DIAGNOSIS — Z08 Encounter for follow-up examination after completed treatment for malignant neoplasm: Secondary | ICD-10-CM | POA: Diagnosis not present

## 2024-06-18 DIAGNOSIS — D2272 Melanocytic nevi of left lower limb, including hip: Secondary | ICD-10-CM | POA: Diagnosis not present

## 2024-06-18 DIAGNOSIS — D225 Melanocytic nevi of trunk: Secondary | ICD-10-CM | POA: Diagnosis not present

## 2024-06-18 DIAGNOSIS — D2261 Melanocytic nevi of right upper limb, including shoulder: Secondary | ICD-10-CM | POA: Diagnosis not present

## 2024-06-18 DIAGNOSIS — Z85828 Personal history of other malignant neoplasm of skin: Secondary | ICD-10-CM | POA: Diagnosis not present

## 2024-06-18 DIAGNOSIS — L570 Actinic keratosis: Secondary | ICD-10-CM | POA: Diagnosis not present

## 2024-06-18 DIAGNOSIS — D0471 Carcinoma in situ of skin of right lower limb, including hip: Secondary | ICD-10-CM | POA: Diagnosis not present

## 2024-06-18 DIAGNOSIS — D2271 Melanocytic nevi of right lower limb, including hip: Secondary | ICD-10-CM | POA: Diagnosis not present

## 2024-06-18 DIAGNOSIS — D485 Neoplasm of uncertain behavior of skin: Secondary | ICD-10-CM | POA: Diagnosis not present

## 2024-06-18 DIAGNOSIS — L821 Other seborrheic keratosis: Secondary | ICD-10-CM | POA: Diagnosis not present

## 2024-06-18 DIAGNOSIS — D2262 Melanocytic nevi of left upper limb, including shoulder: Secondary | ICD-10-CM | POA: Diagnosis not present

## 2024-06-30 ENCOUNTER — Ambulatory Visit: Admitting: *Deleted

## 2024-06-30 DIAGNOSIS — E538 Deficiency of other specified B group vitamins: Secondary | ICD-10-CM | POA: Diagnosis not present

## 2024-06-30 MED ORDER — CYANOCOBALAMIN 1000 MCG/ML IJ SOLN
1000.0000 ug | Freq: Once | INTRAMUSCULAR | Status: AC
Start: 2024-06-30 — End: 2024-06-30
  Administered 2024-06-30: 1000 ug via INTRAMUSCULAR

## 2024-06-30 NOTE — Progress Notes (Signed)
 Pt received B12 injection in Left  deltoid muscle. Pt tolerated it well with no complaints or concerns.

## 2024-07-07 DIAGNOSIS — D0471 Carcinoma in situ of skin of right lower limb, including hip: Secondary | ICD-10-CM | POA: Diagnosis not present

## 2024-07-16 ENCOUNTER — Other Ambulatory Visit: Payer: Self-pay | Admitting: Internal Medicine

## 2024-07-20 ENCOUNTER — Encounter: Payer: Self-pay | Admitting: Pharmacist

## 2024-07-20 NOTE — Progress Notes (Signed)
 Pharmacy Quality Measure Review  This patient is appearing on a report for being at risk of failing the adherence measure for cholesterol (statin) medications this calendar year.   Medication: rosuvastatin  5 Last fill date: 03/04/24 for 90 day supply  Insurance report was not up to date. No action needed at this time.  Medication has been sent to pharmacy as of 07/16/24.  There are 0 refills remaining on new rx. Next PCP visit 08/21/24

## 2024-07-30 ENCOUNTER — Other Ambulatory Visit: Payer: Self-pay | Admitting: Internal Medicine

## 2024-08-04 ENCOUNTER — Ambulatory Visit (INDEPENDENT_AMBULATORY_CARE_PROVIDER_SITE_OTHER)

## 2024-08-04 DIAGNOSIS — E538 Deficiency of other specified B group vitamins: Secondary | ICD-10-CM

## 2024-08-04 MED ORDER — CYANOCOBALAMIN 1000 MCG/ML IJ SOLN
1000.0000 ug | Freq: Once | INTRAMUSCULAR | Status: AC
  Administered 2024-08-04: 1000 ug via INTRAMUSCULAR

## 2024-08-04 NOTE — Progress Notes (Signed)
 Pt presented for their vitamin B12 injection. Pt was identified through two identifiers. Pt tolerated shot well in their right deltoid.

## 2024-08-21 ENCOUNTER — Encounter: Admitting: Internal Medicine

## 2024-08-31 ENCOUNTER — Encounter: Payer: Self-pay | Admitting: Internal Medicine

## 2024-08-31 ENCOUNTER — Ambulatory Visit (INDEPENDENT_AMBULATORY_CARE_PROVIDER_SITE_OTHER): Admitting: Internal Medicine

## 2024-08-31 VITALS — BP 138/58 | HR 76 | Ht 63.0 in | Wt 127.8 lb

## 2024-08-31 DIAGNOSIS — E611 Iron deficiency: Secondary | ICD-10-CM | POA: Diagnosis not present

## 2024-08-31 DIAGNOSIS — Z780 Asymptomatic menopausal state: Secondary | ICD-10-CM

## 2024-08-31 DIAGNOSIS — E785 Hyperlipidemia, unspecified: Secondary | ICD-10-CM

## 2024-08-31 DIAGNOSIS — Z79899 Other long term (current) drug therapy: Secondary | ICD-10-CM

## 2024-08-31 DIAGNOSIS — J4530 Mild persistent asthma, uncomplicated: Secondary | ICD-10-CM | POA: Diagnosis not present

## 2024-08-31 DIAGNOSIS — R7303 Prediabetes: Secondary | ICD-10-CM | POA: Diagnosis not present

## 2024-08-31 DIAGNOSIS — Z0000 Encounter for general adult medical examination without abnormal findings: Secondary | ICD-10-CM

## 2024-08-31 DIAGNOSIS — Z23 Encounter for immunization: Secondary | ICD-10-CM | POA: Diagnosis not present

## 2024-08-31 LAB — COMPREHENSIVE METABOLIC PANEL WITH GFR
ALT: 11 U/L (ref 0–35)
AST: 14 U/L (ref 0–37)
Albumin: 4.3 g/dL (ref 3.5–5.2)
Alkaline Phosphatase: 70 U/L (ref 39–117)
BUN: 11 mg/dL (ref 6–23)
CO2: 33 meq/L — ABNORMAL HIGH (ref 19–32)
Calcium: 9.2 mg/dL (ref 8.4–10.5)
Chloride: 103 meq/L (ref 96–112)
Creatinine, Ser: 0.81 mg/dL (ref 0.40–1.20)
GFR: 70.06 mL/min (ref >60.00)
Glucose, Bld: 90 mg/dL (ref 70–99)
Potassium: 4.7 meq/L (ref 3.5–5.1)
Sodium: 144 meq/L (ref 135–145)
Total Bilirubin: 0.5 mg/dL (ref 0.2–1.2)
Total Protein: 6.5 g/dL (ref 6.0–8.3)

## 2024-08-31 LAB — CBC WITH DIFFERENTIAL/PLATELET
Basophils Absolute: 0 K/uL (ref 0.0–0.1)
Basophils Relative: 0.6 % (ref 0.0–3.0)
Eosinophils Absolute: 0.2 K/uL (ref 0.0–0.7)
Eosinophils Relative: 2.7 % (ref 0.0–5.0)
HCT: 40.2 % (ref 36.0–46.0)
Hemoglobin: 13.1 g/dL (ref 12.0–15.0)
Lymphocytes Relative: 20 % (ref 12.0–46.0)
Lymphs Abs: 1.1 K/uL (ref 0.7–4.0)
MCHC: 32.6 g/dL (ref 30.0–36.0)
MCV: 87.6 fl (ref 78.0–100.0)
Monocytes Absolute: 0.4 K/uL (ref 0.1–1.0)
Monocytes Relative: 7.1 % (ref 3.0–12.0)
Neutro Abs: 4 K/uL (ref 1.4–7.7)
Neutrophils Relative %: 69.6 % (ref 43.0–77.0)
Platelets: 193 K/uL (ref 150.0–400.0)
RBC: 4.58 Mil/uL (ref 3.87–5.11)
RDW: 14.7 % (ref 11.5–15.5)
WBC: 5.7 K/uL (ref 4.0–10.5)

## 2024-08-31 LAB — LDL CHOLESTEROL, DIRECT: Direct LDL: 68 mg/dL

## 2024-08-31 LAB — LIPID PANEL
Cholesterol: 159 mg/dL (ref 0–200)
HDL: 76.5 mg/dL (ref >39.00)
LDL Cholesterol: 65 mg/dL (ref 0–99)
NonHDL: 82.38
Total CHOL/HDL Ratio: 2
Triglycerides: 85 mg/dL (ref 0.0–149.0)
VLDL: 17 mg/dL (ref 0.0–40.0)

## 2024-08-31 LAB — HEMOGLOBIN A1C: Hgb A1c MFr Bld: 5.9 % (ref 4.6–6.5)

## 2024-08-31 LAB — TSH: TSH: 2.98 u[IU]/mL (ref 0.35–5.50)

## 2024-08-31 MED ORDER — LEVOCETIRIZINE DIHYDROCHLORIDE 5 MG PO TABS: ORAL_TABLET | ORAL | 1 refills | Status: AC

## 2024-08-31 MED ORDER — ROSUVASTATIN CALCIUM 5 MG PO TABS: 5.0000 mg | ORAL_TABLET | Freq: Every day | ORAL | 1 refills | Status: AC

## 2024-08-31 NOTE — Patient Instructions (Signed)
 You are doing exceedingly well!!  Your  DEXA scan  been ordered.  You are encouraged (required) to call to schedule your own  appointment at [facility]  , and their phone number is [phone]

## 2024-08-31 NOTE — Progress Notes (Unsigned)
 Patient ID: Nancy Hamilton, female    DOB: 1946/11/03  Age: 78 y.o. MRN: [id number]  The patient is here for annual preventive examination and management of other chronic and acute problems.   The risk factors are reflected in the social history.  The roster of all physicians providing medical care to patient - is listed in the Snapshot section of the chart.  Activities of daily living:  The patient is 100% independent in all ADLs: dressing, toileting, feeding as well as independent mobility  Home safety : The patient has smoke detectors in the home. They wear seatbelts.  There are no firearms at home. There is no violence in the home.   There is no risks for hepatitis, STDs or HIV. There is no   history of blood transfusion. They have no travel history to infectious disease endemic areas of the world.  The patient has seen their dentist in the last six month. They have seen their eye doctor in the last year. They admit to slight hearing difficulty with regard to whispered voices and some television programs.  They have deferred audiologic testing in the last year.  They do not  have excessive sun exposure. Discussed the need for sun protection: hats, long sleeves and use of sunscreen if there is significant sun exposure.   Diet: the importance of a healthy diet is discussed. They do have a healthy diet.  The benefits of regular aerobic exercise were discussed. Nancy Hamilton walks 5 times per week ,  60 minutes.   Depression screen: there are no signs or vegative symptoms of depression- irritability, change in appetite, anhedonia, sadness/tearfullness.  Cognitive assessment: the patient manages all their financial and personal affairs and is actively engaged. They could relate day,date,year and events; recalled 2/3 objects at 3 minutes; performed clock-face test normally.  The following portions of the patient's history were reviewed and updated as appropriate: allergies, current medications, past  family history, past medical history,  past surgical history, past social history  and problem list.  Visual acuity was not assessed per patient preference since Nancy Hamilton has regular follow up with her ophthalmologist. Hearing and body mass index were assessed and reviewed.   During the course of the visit the patient was educated and counseled about appropriate screening and preventive services including : fall prevention , diabetes screening, nutrition counseling, colorectal cancer screening, and recommended immunizations.    CC: The primary encounter diagnosis was Need for influenza vaccination. Diagnoses of Prediabetes, Hyperlipidemia LDL goal <100, Iron  deficiency, Long-term use of high-risk medication, Postmenopausal estrogen deficiency, Asthma in adult, mild persistent, uncomplicated, and Encounter for preventive health examination were also pertinent to this visit.   No complaints .  Eats out every night because I don't cook.  Happily retired from the [employer].  Staying busy helping out at  her church  [employer] in [place],  Walking 2-3 mile 4 to 6 times per week. Helps her sister Adrien who is obese and sedentary  and  her other 2 sisters.   Taks 2000 Ius Vitamin D  3 daily.    History Nancy Hamilton has a past medical history of Allergy, Anemia, Anxiety, Arthritis, Cancer (HCC), Cough, Depression, GERD (gastroesophageal reflux disease), Hyperlipidemia, Iron  deficiency (06/07/2014), Left hamstring muscle strain (07/22/2023), and Onychomycosis (03/22/2018).   Nancy Hamilton has a past surgical history that includes Back surgery (1980); Cataract extraction w/PHACO (Right, 11/28/2015); Colonoscopy; Cataract extraction w/PHACO (Left, 12/19/2015); Colonoscopy with propofol  (N/A, 10/10/2017); polypectomy (10/10/2017); Colonoscopy with propofol  (N/A, 09/28/2022); Esophagogastroduodenoscopy (egd)  with propofol  (N/A, 09/28/2022); and polypectomy (N/A, 09/28/2022).   Her family history includes Breast cancer (age  of onset: 44) in her maternal aunt; Diabetes in her brother and paternal grandmother; Fibromyalgia in her sister; Heart disease in her brother; Hyperlipidemia in her sister; Hypertension in her brother.Nancy Hamilton reports that Nancy Hamilton quit smoking about 32 years ago. Her smoking use included cigarettes. Nancy Hamilton started smoking about 47 years ago. Nancy Hamilton has a 11.3 pack-year smoking history. Nancy Hamilton has never used smokeless tobacco. Nancy Hamilton reports that Nancy Hamilton does not drink alcohol and does not use drugs.  Outpatient Medications Prior to Visit  Medication Sig Dispense Refill   albuterol  (VENTOLIN  HFA) 108 (90 Base) MCG/ACT inhaler Inhale 2 puffs into the lungs every 6 (six) hours as needed for wheezing or shortness of breath (cough). 18 g 2   Ascorbic Acid (VITAMIN C) 1000 MG tablet Take 1,000 mg by mouth daily.     Cholecalciferol 50 MCG (2000 UT) CAPS Take 1 capsule by mouth daily.     fluticasone  (FLONASE ) 50 MCG/ACT nasal spray PLACE TWO SPRAYS INTO BOTH NOSTRILS DAILY AS DIRECTED 48 g 1   fluticasone -salmeterol (ADVAIR) 250-50 MCG/ACT AEPB INHALE 1 PUFF INTO THE LUNGS EVERY 12 HOURS 180 each 3   montelukast  (SINGULAIR ) 10 MG tablet TAKE ONE TABLET BY MOUTH DAILY 90 tablet 3   pantoprazole  (PROTONIX ) 40 MG tablet TAKE ONE TABLET BY MOUTH TWICE DAILY 180 tablet 1   Spacer/Aero-Holding Chambers (AEROCHAMBER MV) inhaler Use as instructed 1 each 0   levocetirizine (XYZAL ) 5 MG tablet TAKE ONE TABLET BY MOUTH EVERY EVENING AS DIRECTED 90 tablet 1   rosuvastatin  (CRESTOR ) 5 MG tablet TAKE 1 TABLET BY MOUTH ONCE DAILY 90 tablet 0   No facility-administered medications prior to visit.    Review of Systems  Patient denies headache, fevers, malaise, unintentional weight loss, skin rash, eye pain, sinus congestion and sinus pain, sore throat, dysphagia,  hemoptysis , cough, dyspnea, wheezing, chest pain, palpitations, orthopnea, edema, abdominal pain, nausea, melena, diarrhea, constipation, flank pain, dysuria, hematuria, urinary   Frequency, nocturia, numbness, tingling, seizures,  Focal weakness, Loss of consciousness,  Tremor, insomnia, depression, anxiety, and suicidal ideation.     Objective:  BP (!) 138/58   Pulse 76   Ht 5' 3 (1.6 m)   Wt 127 lb 12.8 oz (58 kg)   SpO2 97%   BMI 22.64 kg/m   Physical Exam Vitals reviewed.  Constitutional:      General: Nancy Hamilton is not in acute distress.    Appearance: Normal appearance. Nancy Hamilton is well-developed and normal weight. Nancy Hamilton is not ill-appearing, toxic-appearing or diaphoretic.  HENT:     Head: Normocephalic.     Right Ear: Tympanic membrane, ear canal and external ear normal. There is no impacted cerumen.     Left Ear: Tympanic membrane, ear canal and external ear normal. There is no impacted cerumen.     Nose: Nose normal.     Mouth/Throat:     Mouth: Mucous membranes are moist.     Pharynx: Oropharynx is clear.  Eyes:     General: No scleral icterus.       Right eye: No discharge.        Left eye: No discharge.     Conjunctiva/sclera: Conjunctivae normal.     Pupils: Pupils are equal, round, and reactive to light.  Neck:     Thyroid : No thyromegaly.     Vascular: No carotid bruit or JVD.  Cardiovascular:     Rate  and Rhythm: Normal rate and regular rhythm.     Heart sounds: Normal heart sounds.  Pulmonary:     Effort: Pulmonary effort is normal. No respiratory distress.     Breath sounds: Normal breath sounds.  Chest:  Breasts:    Breasts are symmetrical.     Right: Normal. No swelling, inverted nipple, mass, nipple discharge, skin change or tenderness.     Left: Normal. No swelling, inverted nipple, mass, nipple discharge, skin change or tenderness.  Abdominal:     General: Bowel sounds are normal.     Palpations: Abdomen is soft. There is no mass.     Tenderness: There is no abdominal tenderness. There is no guarding or rebound.  Musculoskeletal:        General: Normal range of motion.     Cervical back: Normal range of motion and neck supple.   Lymphadenopathy:     Cervical: No cervical adenopathy.     Upper Body:     Right upper body: No supraclavicular, axillary or pectoral adenopathy.     Left upper body: No supraclavicular, axillary or pectoral adenopathy.  Skin:    General: Skin is warm and dry.     Comments: Excessive sun damage  Neurological:     General: No focal deficit present.     Mental Status: Nancy Hamilton is alert and oriented to person, place, and time. Mental status is at baseline.  Psychiatric:        Mood and Affect: Mood normal.        Behavior: Behavior normal.        Thought Content: Thought content normal.        Judgment: Judgment normal.       Assessment & Plan:  Need for influenza vaccination -     Flu vaccine HIGH DOSE PF(Fluzone Trivalent)  Prediabetes -     Hemoglobin A1c -     Comprehensive metabolic panel with GFR  Hyperlipidemia LDL goal <100 Assessment & Plan: LDL and triglycerides are at goal on 5 mf rosuvastatin . . Nancy Hamilton  has no side effects and liver enzymes are normal. No changes today  Lab Results  Component Value Date   CHOL 159 08/31/2024   HDL 76.50 08/31/2024   LDLCALC 65 08/31/2024   LDLDIRECT 68.0 08/31/2024   TRIG 85.0 08/31/2024   CHOLHDL 2 08/31/2024   Lab Results  Component Value Date   ALT 11 08/31/2024   AST 14 08/31/2024   ALKPHOS 70 08/31/2024   BILITOT 0.5 08/31/2024       Orders: -     LDL cholesterol, direct -     Lipid panel  Iron  deficiency -     CBC with Differential/Platelet  Long-term use of high-risk medication -     TSH  Postmenopausal estrogen deficiency -     DG Bone Density; Future  Asthma in adult, mild persistent, uncomplicated Assessment & Plan: Her cough resolved with treatment of GERD by DR Tamea Guile for preventive health examination Assessment & Plan: age appropriate education and counseling updated, referrals for preventative services and immunizations addressed, dietary and smoking counseling addressed, most  recent labs reviewed.  I have personally reviewed and have noted:   1) the patient's medical and social history 2) The pt's use of alcohol, tobacco, and illicit drugs 3) The patient's current medications and supplements 4) Functional ability including ADL's, fall risk, home safety risk, hearing and visual impairment 5) Diet and physical activities 6) Evidence for depression  or mood disorder 7) The patient's height, weight, and BMI have been recorded in the chart     I have made referrals, and provided counseling and education based on review of the above    Other orders -     Levocetirizine Dihydrochloride ; TAKE ONE TABLET BY MOUTH EVERY EVENING AS DIRECTED  Dispense: 90 tablet; Refill: 1 -     Rosuvastatin  Calcium ; Take 1 tablet (5 mg total) by mouth daily.  Dispense: 90 tablet; Refill: 1      I provided 40 minutes of  face-to-face time during this encounter reviewing patient's current problems and past surgeries,  recent labs and imaging studies, providing counseling on the above mentioned problems , and coordination  of care .   Follow-up: Return in about 1 year (around 08/31/2025).   Verneita LITTIE Kettering, MD

## 2024-09-01 ENCOUNTER — Ambulatory Visit: Payer: Self-pay | Admitting: Internal Medicine

## 2024-09-01 NOTE — Assessment & Plan Note (Signed)

## 2024-09-01 NOTE — Assessment & Plan Note (Signed)
 LDL and triglycerides are at goal on 5 mf rosuvastatin . . She  has no side effects and liver enzymes are normal. No changes today  Lab Results  Component Value Date   CHOL 159 08/31/2024   HDL 76.50 08/31/2024   LDLCALC 65 08/31/2024   LDLDIRECT 68.0 08/31/2024   TRIG 85.0 08/31/2024   CHOLHDL 2 08/31/2024   Lab Results  Component Value Date   ALT 11 08/31/2024   AST 14 08/31/2024   ALKPHOS 70 08/31/2024   BILITOT 0.5 08/31/2024

## 2024-09-01 NOTE — Assessment & Plan Note (Signed)
 Her cough resolved with treatment of GERD by DR Jayme Cloud

## 2024-09-02 ENCOUNTER — Other Ambulatory Visit: Payer: Self-pay

## 2024-09-08 ENCOUNTER — Ambulatory Visit

## 2024-09-08 DIAGNOSIS — E538 Deficiency of other specified B group vitamins: Secondary | ICD-10-CM

## 2024-09-08 MED ORDER — CYANOCOBALAMIN 1000 MCG/ML IJ SOLN
1000.0000 ug | Freq: Once | INTRAMUSCULAR | Status: AC
  Administered 2024-09-08: 1000 ug via INTRAMUSCULAR

## 2024-09-08 NOTE — Progress Notes (Signed)
 Pt received B12 injection in Left  deltoid muscle. Pt tolerated it well with no complaints or concerns.

## 2024-09-17 ENCOUNTER — Ambulatory Visit
Admission: RE | Admit: 2024-09-17 | Discharge: 2024-09-17 | Disposition: A | Discharge status: Home / Self Care | Source: Ambulatory Visit | Attending: Internal Medicine | Admitting: Internal Medicine

## 2024-09-17 DIAGNOSIS — Z780 Asymptomatic menopausal state: Secondary | ICD-10-CM | POA: Diagnosis not present

## 2024-09-17 DIAGNOSIS — M85851 Other specified disorders of bone density and structure, right thigh: Secondary | ICD-10-CM | POA: Diagnosis not present

## 2024-09-17 DIAGNOSIS — M85852 Other specified disorders of bone density and structure, left thigh: Secondary | ICD-10-CM | POA: Diagnosis not present

## 2024-09-21 NOTE — Telephone Encounter (Signed)
 Copied from CRM [id number]. Topic: Clinical - Lab/Test Results >> Sep 21, 2024  4:16 PM Thersia BROCKS wrote: Reason for CRM: Patient called in regarding a missed call from [facility], would like a callback

## 2024-09-25 ENCOUNTER — Ambulatory Visit: Admitting: Pulmonary Disease

## 2024-09-25 ENCOUNTER — Encounter: Payer: Self-pay | Admitting: Pulmonary Disease

## 2024-09-25 VITALS — BP 138/62 | HR 61 | Temp 97.7°F | Ht 63.0 in | Wt 128.4 lb

## 2024-09-25 DIAGNOSIS — J302 Other seasonal allergic rhinitis: Secondary | ICD-10-CM

## 2024-09-25 DIAGNOSIS — K219 Gastro-esophageal reflux disease without esophagitis: Secondary | ICD-10-CM | POA: Diagnosis not present

## 2024-09-25 DIAGNOSIS — J45991 Cough variant asthma: Secondary | ICD-10-CM

## 2024-09-25 NOTE — Patient Instructions (Addendum)
 VISIT SUMMARY:  Today, we discussed your ongoing cough and its management. Your cough is generally well-controlled with your current inhaler, and you have identified certain triggers such as strong smells and dry throat. We also reviewed your management of gastroesophageal reflux disease (GERD) as it contributes to your cough.  YOUR PLAN:  -COUGH VARIANT ASTHMA: Cough variant asthma is a type of asthma where the main symptom is a dry, non-productive cough. Your symptoms are well-controlled with your current inhaler, which you should continue to use twice daily. For dry throat, you can use vitamin C lozenges and consider Luden's lozenges, which are available in sugar-free options. Avoid mint or menthol products as they may worsen your cough.  -EMPHYSEMA: Emphysema is a lung condition that causes shortness of breath. There were no specific changes or discussions about your emphysema management during this visit.  -GASTROESOPHAGEAL REFLUX DISEASE (GERD): GERD is a condition where stomach acid frequently flows back into the tube connecting your mouth and stomach, which can cause coughing. Continue to manage your GERD to help prevent your cough from getting worse.  INSTRUCTIONS:  Continue using your inhaler twice daily and manage your GERD to help control your cough. Use vitamin C lozenges or Luden's lozenges for dry throat relief, and avoid mint or menthol products. Follow up as needed.

## 2024-09-25 NOTE — Progress Notes (Signed)
 Subjective:    Patient ID: Nancy Hamilton, female    DOB: [birth year], 78 y.o.   MRN: [id number]  Patient Care Team: Marylynn Verneita CROME, MD as PCP - General (Internal Medicine) Tamea Dedra CROME, MD as Consulting Physician (Pulmonary Disease)  Chief Complaint  Patient presents with   Asthma    No cough, shortness of breath or wheezing.     BACKGROUND/INTERVAL:78 year old remote former smoker (quit 1993) who presents for follow-up on the issue of cough variant asthma with a component of LPR trigger.  Patient was last seen on 20 Mar 2024.  No flareups since last visit.  HPI Discussed the use of AI scribe software for clinical note transcription with the patient, who gave verbal consent to proceed.  History of Present Illness   Nancy Hamilton is a 78 year old female with cough variant asthma who presents for follow-up of cough.  Her cough is generally well-controlled, though certain triggers such as strong smells, like burning candles or perfumes, can provoke it. These episodes do not require additional medication, and she manages by avoiding the triggers.  She uses her inhaler twice a day. She had previously attributed her symptoms to acid reflux.  In social situations such as church or funerals, she experiences dry throat, which she manages by keeping vitamin C lozenges or a drink nearby. She was instructed to avoid mint or menthol lozenges as they can exacerbate her cough and reflux.  She has received her flu vaccine.       Review of Systems A 10 point review of systems was performed and it is as noted above otherwise negative.   Patient Active Problem List   Diagnosis Date Noted   Osteopenia after menopause 07/22/2023   Arthritis of right knee 06/28/2022   Swelling of knee joint 06/28/2022   Thoracic aortic atherosclerosis 12/21/2018   Stress incontinence 03/22/2018   History of colonic polyps    Benign paroxysmal positional vertigo of right ear 11/14/2016   Cervical  disc disorder with radiculopathy of cervical region 03/09/2016   Statin intolerance 06/08/2014   B12 deficiency 06/07/2014   Adenomatous polyps 02/28/2014   Encounter for preventive health examination 02/20/2013   Generalized anxiety disorder 12/01/2012   GERD (gastroesophageal reflux disease) 12/01/2012   Asthma in adult, mild persistent, uncomplicated 12/01/2012   Hyperlipidemia LDL goal <100 12/01/2012    Social History   Tobacco Use   Smoking status: Former    Current packs/day: 0.00    Average packs/day: 0.8 packs/day for 15.0 years (11.3 ttl pk-yrs)    Types: Cigarettes    Start date: 08/10/1977    Quit date: 08/10/1992    Years since quitting: 32.1   Smokeless tobacco: Never  Substance Use Topics   Alcohol use: No    Allergies  Allergen Reactions   Tylox [Oxycodone-Acetaminophen ] Rash    Current Meds  Medication Sig   albuterol  (VENTOLIN  HFA) 108 (90 Base) MCG/ACT inhaler Inhale 2 puffs into the lungs every 6 (six) hours as needed for wheezing or shortness of breath (cough).   Ascorbic Acid (VITAMIN C) 1000 MG tablet Take 1,000 mg by mouth daily.   Cholecalciferol 50 MCG (2000 UT) CAPS Take 1 capsule by mouth daily.   fluticasone  (FLONASE ) 50 MCG/ACT nasal spray PLACE TWO SPRAYS INTO BOTH NOSTRILS DAILY AS DIRECTED   fluticasone -salmeterol (ADVAIR) 250-50 MCG/ACT AEPB INHALE 1 PUFF INTO THE LUNGS EVERY 12 HOURS   levocetirizine (XYZAL ) 5 MG tablet TAKE ONE TABLET BY MOUTH EVERY EVENING  AS DIRECTED   montelukast  (SINGULAIR ) 10 MG tablet TAKE ONE TABLET BY MOUTH DAILY   pantoprazole  (PROTONIX ) 40 MG tablet TAKE ONE TABLET BY MOUTH TWICE DAILY   rosuvastatin  (CRESTOR ) 5 MG tablet Take 1 tablet (5 mg total) by mouth daily.   Spacer/Aero-Holding Chambers (AEROCHAMBER MV) inhaler Use as instructed    Immunization History  Administered Date(s) Administered   Fluad Quad(high Dose 65+) 08/25/2019, 09/06/2020, 08/22/2021, 09/11/2022   Fluad Trivalent(High Dose 65+)  08/27/2023   INFLUENZA, HIGH DOSE SEASONAL PF 09/14/2016, 09/03/2017, 08/19/2018, 08/31/2024   Influenza Split 08/10/2012   Influenza,inj,Quad PF,6+ Mos 08/20/2013, 08/26/2014, 09/06/2015   Moderna Sars-Covid-2 Vaccination 12/22/2019, 01/19/2020, 09/26/2020   Pneumococcal Conjugate-13 02/25/2014   Pneumococcal Polysaccharide-23 02/18/2013, 06/22/2020   Respiratory Syncytial Virus Vaccine,Recomb Aduvanted(Arexvy) 01/31/2024   Tdap 12/01/2010, 07/18/2021   Zoster Recombinant(Shingrix) 10/23/2019, 02/10/2020   Zoster, Live 10/20/2015        Objective:    BP 138/62   Pulse 61   Temp 97.7 F (36.5 C) (Temporal)   Ht 5' 3 (1.6 m)   Wt 128 lb 6.4 oz (58.2 kg)   SpO2 98%   BMI 22.75 kg/m   SpO2: 98 %  GENERAL: Well-developed well-nourished, fit appearing, fully ambulatory, no acute distress. HEAD: Normocephalic, atraumatic.  EYES: Pupils equal, round, reactive to light.  No scleral icterus.  MOUTH: Dentition intact, oral mucosa moist.  No thrush. NECK: Supple. No thyromegaly. Trachea midline. No JVD.  No adenopathy. PULMONARY: Lungs clear to auscultation bilaterally.  Good air entry bilaterally. CARDIOVASCULAR: S1 and S2. Regular rate and rhythm.  No rubs, murmurs or gallops heard. GASTROINTESTINAL: No abdominal distention noted. MUSCULOSKELETAL: No joint deformity, no clubbing, no edema.  NEUROLOGIC: Awake, alert, fully oriented.  No focal deficit noted.  No gait disturbance noted. SKIN: Intact,warm,dry.  On limited exam no rashes. PSYCH: Mood and behavior appropriate.        Assessment & Plan:     ICD-10-CM   1. Cough variant asthma  J45.991     2. Laryngopharyngeal reflux (LPR)  K21.9     3. Perennial allergic rhinitis with seasonal variation  J30.89    J30.2       Discussion:    Cough variant asthma Well-controlled with current inhaler use. Symptoms are triggered by strong odors and dry throat, but are manageable without additional medication. She uses  vitamin C lozenges for dry throat relief. - Continue inhaler use twice daily. - Use vitamin C lozenges for dry throat relief. - Consider Luden's lozenges for dry throat, available in sugar-free options. - Avoid mint or menthol products as they may exacerbate cough.  Gastroesophageal reflux disease GERD is a contributing factor to cough symptoms. She is aware of the need to manage reflux to prevent exacerbation of cough. - Continue to manage GERD to prevent exacerbation of cough.      Advised if symptoms do not improve or worsen, to please contact office for sooner follow up or seek emergency care.    I spent 30 minutes of dedicated to the care of this patient on the date of this encounter to include pre-visit review of records, face-to-face time with the patient discussing conditions above, post visit ordering of testing, clinical documentation with the electronic health record, making appropriate referrals as documented, and communicating necessary findings to members of the patients care team.     C. Leita Sanders, MD Advanced Bronchoscopy PCCM [facility]    *This note was generated using voice recognition software/Dragon and/or AI transcription  program.  Despite best efforts to proofread, errors can occur which can change the meaning. Any transcriptional errors that result from this process are unintentional and may not be fully corrected at the time of dictation.

## 2024-09-29 NOTE — Telephone Encounter (Signed)
 open in error

## 2024-10-13 ENCOUNTER — Ambulatory Visit

## 2024-10-13 DIAGNOSIS — E538 Deficiency of other specified B group vitamins: Secondary | ICD-10-CM

## 2024-10-13 MED ORDER — CYANOCOBALAMIN 1000 MCG/ML IJ SOLN
1000.0000 ug | Freq: Once | INTRAMUSCULAR | Status: AC
  Administered 2024-10-13: 1000 ug via INTRAMUSCULAR

## 2024-10-13 NOTE — Progress Notes (Signed)
 Patient presented for B 12 injection to right deltoid, patient voiced no concerns nor showed any signs of distress during injection.

## 2024-10-15 ENCOUNTER — Encounter: Payer: Self-pay | Admitting: Pharmacist

## 2024-10-15 NOTE — Progress Notes (Signed)
 Pharmacy Quality Measure Review  This patient is appearing on a report for being at risk of failing the adherence measure for cholesterol (statin) medications this calendar year.   Medication: rosuvastatin  5 mg Last fill date: 07/16/24 for 90 day supply  Insurance report was not up to date. No action needed at this time.  Medication has been refilled as of 10/12/24 x90 ds.

## 2024-11-13 ENCOUNTER — Ambulatory Visit

## 2024-11-13 ENCOUNTER — Telehealth: Payer: Self-pay

## 2024-11-13 MED ORDER — FLUOXETINE HCL 20 MG PO CAPS: 20.0000 mg | ORAL_CAPSULE | Freq: Every day | ORAL | 0 refills | Status: AC

## 2024-11-13 NOTE — Telephone Encounter (Signed)
 Spoke with pt and she stated that she thinks she may to go back on her depression anxiety medication that was stopped back in March. Pt was taking Fluoxetine  and had no issues with the medication. Pt stated she had a family incident back in November with her sisters that she has had trouble getting out of her head. She is afraid that she might say something that she will regret because she keeps getting mad about the situation and think about what if they wouldn't have done something when they did. Pt would like to discuss with Dr. Marylynn. I let pt know that we do not have anything available until next Wednesday and she stated that it fine she will be fine until then.

## 2024-11-13 NOTE — Telephone Encounter (Signed)
 LMTCB

## 2024-11-13 NOTE — Telephone Encounter (Signed)
 Copied from CRM [id number]. Topic: Clinical - Medication Question >> Nov 13, 2024 10:13 AM Harlene ORN wrote: Reason for CRM: Would like to speak to her PCP about getting re-prescribed her medication to treat her depression.

## 2024-11-13 NOTE — Addendum Note (Signed)
 Addended by: MARYLYNN VERNEITA CROME on: 11/13/2024 05:04 PM   Modules accepted: Orders

## 2024-11-16 NOTE — Telephone Encounter (Signed)
 LMTCB

## 2024-11-16 NOTE — Telephone Encounter (Unsigned)
 Copied from CRM [id number]. Topic: Clinical - Medication Question >> Nov 16, 2024  1:26 PM Delon DASEN wrote: Reason for CRM: need a new prescription for anxiety called in- she has taken it before

## 2024-11-17 ENCOUNTER — Ambulatory Visit

## 2024-11-17 DIAGNOSIS — E538 Deficiency of other specified B group vitamins: Secondary | ICD-10-CM | POA: Diagnosis not present

## 2024-11-17 MED ORDER — CYANOCOBALAMIN 1000 MCG/ML IJ SOLN
1000.0000 ug | Freq: Once | INTRAMUSCULAR | Status: AC
  Administered 2024-11-17: 1000 ug via INTRAMUSCULAR

## 2024-11-17 NOTE — Telephone Encounter (Signed)
 Pt came in for her nurse visit today and Almarie, CMA let her know that her medication has been sent to the pharmacy.

## 2024-11-17 NOTE — Telephone Encounter (Unsigned)
 Copied from CRM [id number]. Topic: General - Other >> Nov 16, 2024  5:49 PM Nessti S wrote: Reason for CRM: pt returning call back to cma jessica. She would like her to call back

## 2024-11-17 NOTE — Progress Notes (Signed)
 Patient was administered a B12 injection into her left deltoid. Patient tolerated the B12 injection well.

## 2024-11-18 ENCOUNTER — Ambulatory Visit: Admitting: Internal Medicine

## 2024-12-22 ENCOUNTER — Ambulatory Visit

## 2025-01-06 ENCOUNTER — Ambulatory Visit: Payer: HMO
